# Patient Record
Sex: Male | Born: 1940 | Race: White | Hispanic: No | Smoking: Former smoker
Health system: Southern US, Community
[De-identification: ages and names within clinical notes are randomized; demographics above are authoritative.]

## PROBLEM LIST (undated history)

## (undated) DIAGNOSIS — I447 Left bundle-branch block, unspecified: Secondary | ICD-10-CM

## (undated) DIAGNOSIS — J449 Chronic obstructive pulmonary disease, unspecified: Secondary | ICD-10-CM

## (undated) DIAGNOSIS — I2510 Atherosclerotic heart disease of native coronary artery without angina pectoris: Secondary | ICD-10-CM

## (undated) DIAGNOSIS — I10 Essential (primary) hypertension: Secondary | ICD-10-CM

## (undated) DIAGNOSIS — G43909 Migraine, unspecified, not intractable, without status migrainosus: Secondary | ICD-10-CM

## (undated) DIAGNOSIS — F17200 Nicotine dependence, unspecified, uncomplicated: Secondary | ICD-10-CM

## (undated) DIAGNOSIS — F1011 Alcohol abuse, in remission: Secondary | ICD-10-CM

## (undated) DIAGNOSIS — I951 Orthostatic hypotension: Secondary | ICD-10-CM

## (undated) DIAGNOSIS — I340 Nonrheumatic mitral (valve) insufficiency: Secondary | ICD-10-CM

## (undated) DIAGNOSIS — Z4502 Encounter for adjustment and management of automatic implantable cardiac defibrillator: Secondary | ICD-10-CM

## (undated) DIAGNOSIS — N400 Enlarged prostate without lower urinary tract symptoms: Secondary | ICD-10-CM

## (undated) DIAGNOSIS — E785 Hyperlipidemia, unspecified: Secondary | ICD-10-CM

## (undated) DIAGNOSIS — R55 Syncope and collapse: Secondary | ICD-10-CM

## (undated) DIAGNOSIS — C3490 Malignant neoplasm of unspecified part of unspecified bronchus or lung: Secondary | ICD-10-CM

## (undated) DIAGNOSIS — R911 Solitary pulmonary nodule: Secondary | ICD-10-CM

## (undated) DIAGNOSIS — F1010 Alcohol abuse, uncomplicated: Secondary | ICD-10-CM

## (undated) HISTORY — DX: Hyperlipidemia, unspecified: E78.5

## (undated) HISTORY — DX: Alcohol abuse, in remission: F10.11

## (undated) HISTORY — DX: Essential (primary) hypertension: I10

## (undated) HISTORY — DX: Alcohol abuse, uncomplicated: F10.10

## (undated) HISTORY — DX: Nonrheumatic mitral (valve) insufficiency: I34.0

## (undated) HISTORY — DX: Nicotine dependence, unspecified, uncomplicated: F17.200

## (undated) HISTORY — DX: Chronic obstructive pulmonary disease, unspecified: J44.9

## (undated) HISTORY — DX: Migraine, unspecified, not intractable, without status migrainosus: G43.909

## (undated) HISTORY — DX: Atherosclerotic heart disease of native coronary artery without angina pectoris: I25.10

## (undated) HISTORY — DX: Solitary pulmonary nodule: R91.1

## (undated) HISTORY — DX: Orthostatic hypotension: I95.1

## (undated) HISTORY — DX: Benign prostatic hyperplasia without lower urinary tract symptoms: N40.0

## (undated) HISTORY — DX: Syncope and collapse: R55

## (undated) HISTORY — DX: Left bundle-branch block, unspecified: I44.7

## (undated) HISTORY — DX: Encounter for adjustment and management of automatic implantable cardiac defibrillator: Z45.02

---

## 2006-05-09 ENCOUNTER — Ambulatory Visit: Payer: Self-pay | Admitting: Cardiology

## 2006-05-13 ENCOUNTER — Ambulatory Visit: Payer: Self-pay | Admitting: Cardiology

## 2006-05-13 ENCOUNTER — Inpatient Hospital Stay (HOSPITAL_COMMUNITY): Admission: AD | Admit: 2006-05-13 | Discharge: 2006-05-15 | Payer: Self-pay | Admitting: Cardiology

## 2006-06-05 ENCOUNTER — Ambulatory Visit: Payer: Self-pay | Admitting: Physician Assistant

## 2006-07-13 ENCOUNTER — Ambulatory Visit: Payer: Self-pay | Admitting: Cardiology

## 2006-07-31 ENCOUNTER — Ambulatory Visit: Payer: Self-pay | Admitting: Cardiology

## 2006-08-10 ENCOUNTER — Ambulatory Visit: Payer: Self-pay | Admitting: Internal Medicine

## 2006-08-28 ENCOUNTER — Ambulatory Visit: Payer: Self-pay | Admitting: Cardiology

## 2006-09-03 ENCOUNTER — Ambulatory Visit: Payer: Self-pay | Admitting: Cardiology

## 2006-09-18 ENCOUNTER — Ambulatory Visit: Payer: Self-pay | Admitting: Internal Medicine

## 2006-09-28 ENCOUNTER — Ambulatory Visit: Payer: Self-pay | Admitting: Cardiology

## 2006-11-17 ENCOUNTER — Inpatient Hospital Stay (HOSPITAL_COMMUNITY): Admission: RE | Admit: 2006-11-17 | Discharge: 2006-11-18 | Payer: Self-pay | Admitting: Internal Medicine

## 2006-11-17 ENCOUNTER — Ambulatory Visit: Payer: Self-pay | Admitting: Internal Medicine

## 2006-11-18 ENCOUNTER — Encounter: Payer: Self-pay | Admitting: Cardiology

## 2006-12-03 ENCOUNTER — Ambulatory Visit: Payer: Self-pay

## 2006-12-22 ENCOUNTER — Ambulatory Visit: Payer: Self-pay | Admitting: Cardiology

## 2007-03-05 ENCOUNTER — Ambulatory Visit: Payer: Self-pay | Admitting: Internal Medicine

## 2007-05-27 ENCOUNTER — Ambulatory Visit: Payer: Self-pay | Admitting: Internal Medicine

## 2007-08-13 ENCOUNTER — Ambulatory Visit: Payer: Self-pay | Admitting: Cardiology

## 2007-08-13 ENCOUNTER — Encounter: Payer: Self-pay | Admitting: Physician Assistant

## 2007-08-18 ENCOUNTER — Encounter: Payer: Self-pay | Admitting: Cardiology

## 2007-08-20 ENCOUNTER — Ambulatory Visit: Payer: Self-pay | Admitting: Cardiology

## 2007-08-20 ENCOUNTER — Encounter: Payer: Self-pay | Admitting: Cardiology

## 2007-08-20 ENCOUNTER — Ambulatory Visit: Payer: Self-pay | Admitting: Internal Medicine

## 2007-08-31 ENCOUNTER — Encounter: Payer: Self-pay | Admitting: Cardiology

## 2007-09-10 ENCOUNTER — Encounter: Payer: Self-pay | Admitting: Cardiology

## 2007-12-03 ENCOUNTER — Ambulatory Visit: Payer: Self-pay | Admitting: Internal Medicine

## 2007-12-10 ENCOUNTER — Encounter: Payer: Self-pay | Admitting: Cardiology

## 2008-01-11 ENCOUNTER — Ambulatory Visit: Payer: Self-pay | Admitting: Cardiology

## 2008-03-17 ENCOUNTER — Encounter: Payer: Self-pay | Admitting: Internal Medicine

## 2008-06-29 ENCOUNTER — Ambulatory Visit: Payer: Self-pay | Admitting: Internal Medicine

## 2008-06-29 ENCOUNTER — Encounter: Payer: Self-pay | Admitting: Internal Medicine

## 2008-08-04 ENCOUNTER — Ambulatory Visit: Payer: Self-pay | Admitting: Cardiology

## 2008-10-11 ENCOUNTER — Encounter: Payer: Self-pay | Admitting: Internal Medicine

## 2008-10-11 ENCOUNTER — Telehealth (INDEPENDENT_AMBULATORY_CARE_PROVIDER_SITE_OTHER): Payer: Self-pay | Admitting: *Deleted

## 2008-11-29 ENCOUNTER — Ambulatory Visit: Payer: Self-pay | Admitting: Internal Medicine

## 2009-02-07 ENCOUNTER — Encounter: Payer: Self-pay | Admitting: Cardiology

## 2009-02-08 ENCOUNTER — Ambulatory Visit: Payer: Self-pay | Admitting: Cardiology

## 2009-02-23 ENCOUNTER — Ambulatory Visit: Payer: Self-pay | Admitting: Internal Medicine

## 2009-05-22 ENCOUNTER — Encounter: Payer: Self-pay | Admitting: Cardiology

## 2009-06-01 ENCOUNTER — Ambulatory Visit: Payer: Self-pay | Admitting: Internal Medicine

## 2009-10-12 ENCOUNTER — Ambulatory Visit: Payer: Self-pay | Admitting: Cardiology

## 2009-11-02 ENCOUNTER — Encounter (INDEPENDENT_AMBULATORY_CARE_PROVIDER_SITE_OTHER): Payer: Self-pay | Admitting: *Deleted

## 2010-02-25 ENCOUNTER — Encounter: Payer: Self-pay | Admitting: Internal Medicine

## 2010-02-25 ENCOUNTER — Ambulatory Visit
Admission: RE | Admit: 2010-02-25 | Discharge: 2010-02-25 | Payer: Self-pay | Source: Home / Self Care | Attending: Internal Medicine | Admitting: Internal Medicine

## 2010-03-05 NOTE — Letter (Signed)
Summary: Device-Delinquent Check  [facility], Main Office  [address]   [place], [place] [address]   Phone: [phone]  Fax: [phone]     November 02, 2009 MRN: [id number]   Paul [name] [address] [place], [place]  [address]   Dear Mr. FLEAGLE,  According to our records, you have not had your implanted device checked in the recommended period of time.  We are unable to determine appropriate device function without checking your device on a regular basis.  Please call our office to schedule an appointment with Dr Johney Frame,  as soon as possible.  If you are having your device checked by another physician, please call us so that we may update our records.  Thank you,  Letta Moynahan, EMT  November 02, 2009 10:18 AM  [facility]

## 2010-03-05 NOTE — Procedures (Signed)
Summary: pacer clinic   Current Medications (verified): 1)  Lisinopril 20 Mg Tabs (Lisinopril) .... Take 1/2 Tablet By Mouth Once A Day 2)  Aspirin 325 Mg Tabs (Aspirin) .... Take 1 Tablet By Mouth Once A Day 3)  Omeprazole 20 Mg Cpdr (Omeprazole) .... Take 1 Tablet By Mouth Once A Day 4)  Folic Acid 1 Mg Tabs (Folic Acid) .... Take 1 Tablet By Mouth Once A Day 5)  Diclofenac Sodium 75 Mg Tbec (Diclofenac Sodium) .... Take 1 Tablet By Mouth Once A Day 6)  Coreg 12.5 Mg Tabs (Carvedilol) .... Take 1 Tablet By Mouth Two Times A Day 7)  Crestor 40 Mg Tabs (Rosuvastatin Calcium) .... Take 1 Tablet By Mouth Once A Day 8)  Alprazolam 0.25 Mg Tabs (Alprazolam) .... Take 1 Tablet By Mouth Two Times A Day As Needed 9)  Lasix 40 Mg Tabs (Furosemide) .... Take 1 Tablet By Mouth Once A Day 10)  Zomig 2.5 Mg Tabs (Zolmitriptan) .... Take 1 Tablet By Mouth Once A Day As Needed 11)  Finasteride 5 Mg Tabs (Finasteride) .... Take 1 Tablet By Mouth Once A Day 12)  Proair Hfa 108 (90 Base) Mcg/act Aers (Albuterol Sulfate) .... As Needed 13)  Apap/butubital 325 .... As Needed 14)  Tramadol Hcl 50 Mg Tabs (Tramadol Hcl) .... Take 1-2 Tablets By Mouth Every 8-12 Hours As Needed Pain. 15)  Divalproex Sodium 500 Mg Tbec (Divalproex Sodium) .... Take 3 Tablets By Mouth At Bedtime  Allergies (verified): 1)  Morphine Sulfate (Morphine Sulfate)   ICD Specifications Following MD:  Hillis Range, MD     Referring MD:  KATZ ICD Vendor:  Medtronic     ICD Model Number:  [id number]     ICD Serial Number:  [id number] ICD DOI:  11/17/2006     ICD Implanting MD:  Sherryl Manges, MD  Lead 1:    Location: RA     DOI: 11/17/2006     Model #: 1478     Serial #: [id number]     Status: active Lead 2:    Location: RV     DOI: 11/17/2006     Model #: 5784     Serial #: [id number]     Status: active Lead 3:    Location: LV     DOI: 11/17/2006     Model #: 1324     Serial #: [id number]     Status: active  Indications::  NICM, CHF   ICD  Follow Up Remote Check?  No Battery Voltage:  3.10 V     Charge Time:  10.1 seconds     Underlying rhythm:  SR ICD Dependent:  No       ICD Device Measurements Atrium:  Amplitude: 1.9 mV, Impedance: 496 ohms, Threshold: 1.0 V at 0.4 msec Right Ventricle:  Amplitude: 6.7 mV, Impedance: 528 ohms, Threshold: 1.0 V at 0.4 msec Left Ventricle:  Impedance: 496 ohms, Threshold: 0.5 V at 0.4 msec Configuration: LV TIP TO RV COIL Shock Impedance: 51/63 ohms   Episodes MS Episodes:  0     Shock:  0     ATP:  0     Nonsustained:  0     Atrial Pacing:  0.5%     Ventricular Pacing:  98.3%  Brady Parameters Mode DDD     Lower Rate Limit:  50     Upper Rate Limit 130 PAV 200     Sensed AV Delay:  200  Tachy Zones VF:  200     VT:  FVT VIA VF 200-250     VT1:  176     Next Cardiology Appt Due:  05/04/2009 Tech Comments:  Normal device function.  No changes made today.  Optivol leveling off.  Pt does daily weights.  No symptoms of heart failure.  Pt aware to call if weight increases 2 pounds in 1 day or 5 pounds in 1 week.  ROV 3 months Dr Johney Frame. Gypsy Balsam RN BSN  February 23, 2009 5:29 PM

## 2010-03-05 NOTE — Assessment & Plan Note (Signed)
Summary: 6 mo fu reminder-srs   Visit Type:  Follow-up Primary Provider:  Andee Lineman  [facility] [address]  CC:  cardiomyopathy.  History of Present Illness: The patient is seen for followup his history of cardiomyopathy.  In the past he had a nonischemic cardiopathy.  However echo in July, 2009, revealed ejection fraction improving to 50-55%.  He is doing well.  Is not having any chest pain.  He goes about full activities.  He is followed at the outpatient with the [facility] facility.  There is now a second facility and his doctor is on [employer].  Preventive Screening-Counseling & Management  Alcohol-Tobacco     Smoking Status: current     Packs/Day: 0.33  Comments: Pt is trying to quit smoking. He is currently smoking about 1/3 ppd. He has smoked for about 50 yrs.   Current Medications (verified): 1)  Lisinopril 20 Mg Tabs (Lisinopril) .... Take 1/2 Tablet By Mouth Once A Day 2)  Aspirin 325 Mg Tabs (Aspirin) .... Take 1 Tablet By Mouth Once A Day 3)  Omeprazole 20 Mg Cpdr (Omeprazole) .... Take 1 Tablet By Mouth Once A Day 4)  Folic Acid 1 Mg Tabs (Folic Acid) .... Take 1 Tablet By Mouth Once A Day 5)  Diclofenac Sodium 75 Mg Tbec (Diclofenac Sodium) .... Take 1 Tablet By Mouth Once A Day 6)  Coreg 12.5 Mg Tabs (Carvedilol) .... Take 1 Tablet By Mouth Two Times A Day 7)  Crestor 40 Mg Tabs (Rosuvastatin Calcium) .... Take 1 Tablet By Mouth Once A Day 8)  Alprazolam 0.25 Mg Tabs (Alprazolam) .... Take 1 Tablet By Mouth Two Times A Day As Needed 9)  Lasix 40 Mg Tabs (Furosemide) .... Take 1 Tablet By Mouth Once A Day 10)  Zomig 2.5 Mg Tabs (Zolmitriptan) .... Take 1 Tablet By Mouth Once A Day As Needed 11)  Finasteride 5 Mg Tabs (Finasteride) .... Take 1 Tablet By Mouth Once A Day 12)  Proair Hfa 108 (90 Base) Mcg/act Aers (Albuterol Sulfate) .... As Needed 13)  Apap/butubital 325 .... As Needed 14)  Tramadol Hcl 50 Mg Tabs (Tramadol Hcl) .... Take 1-2 Tablets By Mouth Every  8-12 Hours As Needed Pain. 15)  Divalproex Sodium 500 Mg Tbec (Divalproex Sodium) .... Take 3 Tablets By Mouth At Bedtime  Allergies: 1)  Morphine Sulfate (Morphine Sulfate)  Comments:  Nurse/Medical Assistant: The patient's medications were reviewed with the patient and were updated in the Medication List. Pt verbally confirmed medications.  Past History:  Past Medical History: Last updated: 02/07/2009 cardiomyopathy.Marland Kitchen ejection fraction 25%...  /  Repeat echo July 17,2009   EF 50%. mitral regurgitation...severe..with annular dilatation and poor coaptation of the mitral leaflets. He didnot want surgery......  /  .2-D echo July 2009(after CRT therapy) mitral regurgitation mild to moderate Pulmonary  hypertension... ....echo.Marland KitchenMarland KitchenJuly 2009 COPD smoking....chantix caused headaches lung nodule in the past..[facility] Dys[facility] BPH gout alcohol use  in past.... stable over a long period of time migraines LBBB...very long PR interval CRT-D. device by Dr. Graciela Husbands for CHF CAD  catheter April 2008 single-vessel disease with collateralized branch of the left circumflex Syncope....probably orthostasis  Social History: Packs/Day:  0.33  Review of Systems       Patient denies fever, chills, headache, sweats, rash, change in vision, change in hearing, chest pain, shortness of breath, cough, nausea vomiting, urinary symptoms.  His psychological problems are under good control with medication and counseling.  All other systems  are reviewed and are negative.  Vital Signs:  Patient profile:   70 year old male Height:      67 inches Weight:      156.50 pounds Pulse rate:   72 / minute BP sitting:   121 / 76  (left arm) Cuff size:   regular  Vitals Entered By: Cyril Loosen, RN, BSN (February 08, 2009 1:56 PM) CC: cardiomyopathy Comments No cardiac complaints at present.   Physical Exam  General:  Patient is stable today but he does smell of cigarette  smoke. Eyes:  no xanthelasma. Neck:  no jugulovenous tension. Lungs:  lungs are clear.  Respiratory effort is nonlabored. Heart:  cardiac exam reveals S1-S2.  No clicks or significant murmurs. Abdomen:  abdomen is soft. Extremities:  no peripheral edema. Psych:  patient is oriented to person time and place.  Affect is normal.    ICD Specifications Following MD:  Hillis Range, MD     ICD Vendor:  Medtronic     ICD Model Number:  [phone]     ICD Serial Number:  [id number] ICD DOI:  11/17/2006     ICD Implanting MD:  Sherryl Manges, MD  Lead 1:    Location: RA     DOI: 11/17/2006     Model #: 4132     Serial #: [id number]     Status: active Lead 2:    Location: RV     DOI: 11/17/2006     Model #: 6644     Serial #: [id number]     Status: active Lead 3:    Location: LV     DOI: 11/17/2006     Model #: 6387     Serial #: [id number]     Status: active  Indications::  NICM, CHF   ICD Follow Up ICD Dependent:  No       ICD Device Measurements Configuration: LV TIP TO RV COIL  Brady Parameters Mode DDD     Lower Rate Limit:  50     Upper Rate Limit 130 PAV 200     Sensed AV Delay:  200  Tachy Zones VF:  200     VT:  FVT VIA VF 200-250     VT1:  176     Impression & Recommendations:  Problem # 1:  CAD (ICD-414.00) Coronary disease is stable.  No further workup is needed.  Problem # 2:  SYNCOPE (ICD-780.2) Patient has had no recurrent syncope.  No further workup is needed.  Problem # 3:  HYPERTENSION (ICD-401.9) Hypertension is under good control.  No change in his medicines.  Problem # 4:  TOBACCO ABUSE (ICD-305.1) The patient has continued tobacco abuse.  He says he smokes one pack in 3 days.  I have counseled him to encourage him to stop.  Patient Instructions: 1)  Your physician wants you to follow-up in:24months. You will receive a reminder letter in the mail about two months in advance. If you don't receive a letter, please call our office to schedule the follow-up  appointment. 2)  Your physician recommends that you continue on your current medications as directed. Please refer to the Current Medication list given to you today.

## 2010-03-05 NOTE — Cardiovascular Report (Signed)
Summary: Card Device Clinic/ INTERROGATION QUICK LOOK  Card Device Clinic/ INTERROGATION QUICK LOOK   Imported By: Dorise Hiss 06/04/2009 10:59:33  _____________________________________________________________________  External Attachment:    Type:   Image     Comment:   External Document

## 2010-03-05 NOTE — Assessment & Plan Note (Signed)
Summary: 6 MO FU PER JULY REMINDER-SRS   Visit Type:  Follow-up Referring Provider:  Allred Primary Provider:  Andee Lineman  [facility] [address]   History of Present Illness: The patient is seen for followup of cardiomyopathy.  I saw him last January 2 011. he saw Dr. Johney Frame for BP followup in April, 2011.  He has a CRT-D device.  This is functioning normally.  He recently was seen by his doctor at the [place] and he was stable.  He has not had any chest pain.  There's been no syncope or presyncope.  He has not felt any unusual sensation from his ICD.  Preventive Screening-Counseling & Management  Alcohol-Tobacco     Smoking Status: current     Smoking Cessation Counseling: yes     Packs/Day: <1/2 PPD  Current Medications (verified): 1)  Lisinopril 40 Mg Tabs (Lisinopril) .... Take 1/2 Tablet By Mouth Once A Day 2)  Aspirin 325 Mg Tabs (Aspirin) .... Take 1 Tablet By Mouth Once A Day 3)  Omeprazole 20 Mg Cpdr (Omeprazole) .... Take 1 Tablet By Mouth Once A Day 4)  Folic Acid 1 Mg Tabs (Folic Acid) .... Take 1 Tablet By Mouth Once A Day 5)  Coreg 12.5 Mg Tabs (Carvedilol) .... Take 1 Tablet By Mouth Two Times A Day 6)  Crestor 40 Mg Tabs (Rosuvastatin Calcium) .... Take 1 Tablet By Mouth Once A Day 7)  Alprazolam 0.25 Mg Tabs (Alprazolam) .... Take 1 Tablet By Mouth Two Times A Day As Needed 8)  Lasix 40 Mg Tabs (Furosemide) .... Take 1 Tablet By Mouth Once A Day 9)  Finasteride 5 Mg Tabs (Finasteride) .... Take 1 Tablet By Mouth Once A Day 10)  Proair Hfa 108 (90 Base) Mcg/act Aers (Albuterol Sulfate) .... 2 Puffs Four Times A Day As Needed 11)  Apap/butubital 325 .... As Needed 12)  Tramadol Hcl 50 Mg Tabs (Tramadol Hcl) .... Take 1-2 Tablets By Mouth Every 8-12 Hours As Needed Pain. 13)  Divalproex Sodium 500 Mg Tbec (Divalproex Sodium) .... Take 3 Tablets By Mouth At Bedtime 14)  Spiriva Handihaler 18 Mcg Caps (Tiotropium Bromide Monohydrate) .... Once Each Am 15)  Klor-Con 20 Meq Pack  (Potassium Chloride) .... One By Mouth Daily 16)  Multivitamins  Tabs (Multiple Vitamin) .... Take 1 Tablet By Mouth Once A Day 17)  Nitrostat 0.4 Mg Subl (Nitroglycerin) .... Use As Directed Chest Pain 18)  Artificial Tears  Soln (Artificial Tear Solution) .... One Drop Ou Four Times A Day  Allergies (verified): 1)  Morphine Sulfate (Morphine Sulfate)  Comments:  Nurse/Medical Assistant: The patient's medication bottles and allergies were reviewed with the patient and were updated in the Medication and Allergy Lists.  Past History:  Past Medical History: cardiomyopathy.Marland Kitchen ejection fraction 25%...  /  Repeat echo July 17,2009   EF 50%. mitral regurgitation...severe..with annular dilatation and poor coaptation of the mitral leaflets. He didnot want surgery......  /  .2-D echo July 2009(after CRT therapy) mitral regurgitation mild to moderate Pulmonary  hypertension... ....echo.Marland KitchenMarland KitchenJuly 2009 COPD smoking....chantix caused headaches lung nodule in the past..[facility] BPH gout alcohol use  in past.... stable over a long period of time migraines LBBB...very long PR interval CRT-D. device by Dr. Graciela Husbands for CHF CAD  catheter April 2008 single-vessel disease with collateralized branch of the left circumflex Syncope....probably orthostasis  Social History: Packs/Day:  <1/2 PPD  Review of Systems       Patient denies fever, chills,  headache, sweats, rash, change in vision, change in hearing, chest pain, cough, nausea vomiting, urinary symptoms.  All other systems reviewed are negative  Vital Signs:  Patient profile:   70 year old male Height:      67 inches Weight:      154 pounds BMI:     24.21 Pulse rate:   69 / minute BP sitting:   129 / 85  (left arm) Cuff size:   regular  Vitals Entered By: Carlye Grippe (October 12, 2009 10:58 AM)  Physical Exam  General:  patient is stable today. Eyes:  no xanthelasma. Neck:  no jugular  venous distention. Lungs:  lungs are clear respiratory effort is nonlabored. Heart:  cardiac exam reveals S1-S2.  No clicks or significant murmurs. Abdomen:  abdomen is soft. Extremities:  no peripheral edema. Psych:  patient is oriented to person time and place.  Affect is normal.    ICD Specifications Following MD:  Hillis Range, MD     Referring MD:  KATZ ICD Vendor:  Medtronic     ICD Model Number:  [phone]     ICD Serial Number:  [id number] ICD DOI:  11/17/2006     ICD Implanting MD:  Sherryl Manges, MD  Lead 1:    Location: RA     DOI: 11/17/2006     Model #: 5643     Serial #: [id number]     Status: active Lead 2:    Location: RV     DOI: 11/17/2006     Model #: 6063     Serial #: [id number]     Status: active Lead 3:    Location: LV     DOI: 11/17/2006     Model #: 3557     Serial #: [id number]     Status: active  Indications::  NICM, CHF   ICD Follow Up ICD Dependent:  No       ICD Device Measurements Configuration: LV TIP TO RV COIL  Brady Parameters Mode DDD     Lower Rate Limit:  50     Upper Rate Limit 130 PAV 200     Sensed AV Delay:  200  Tachy Zones VF:  200     VT:  FVT VIA VF 200-250     VT1:  176-200     Impression & Recommendations:  Problem # 1:  CAD (ICD-414.00)  His updated medication list for this problem includes:    Lisinopril 40 Mg Tabs (Lisinopril) .Marland Kitchen... Take 1/2 tablet by mouth once a day    Aspirin 325 Mg Tabs (Aspirin) .Marland Kitchen... Take 1 tablet by mouth once a day    Coreg 12.5 Mg Tabs (Carvedilol) .Marland Kitchen... Take 1 tablet by mouth two times a day    Nitrostat 0.4 Mg Subl (Nitroglycerin) ..... Use as directed chest pain  Orders: EKG w/ Interpretation (93000) Coronary disease is stable.  EKG is done and reviewed by me.  He is paced.  No further workup.  Problem # 2:  HYPERTENSION (ICD-401.9)  His updated medication list for this problem includes:    Lisinopril 40 Mg Tabs (Lisinopril) .Marland Kitchen... Take 1/2 tablet by mouth once a day    Aspirin 325 Mg Tabs  (Aspirin) .Marland Kitchen... Take 1 tablet by mouth once a day    Coreg 12.5 Mg Tabs (Carvedilol) .Marland Kitchen... Take 1 tablet by mouth two times a day    Lasix 40 Mg Tabs (Furosemide) .Marland Kitchen... Take 1 tablet by mouth once a day  Orders: EKG w/ Interpretation (93000) Blood pressure is controlled.  No change in therapy.  Problem # 3:  TOBACCO ABUSE (ICD-305.1) Patient continues to smoke.  I have again advised him to stop.  Problem # 4:  CARDIOMYOPATHY (ICD-425.4)  His updated medication list for this problem includes:    Lisinopril 40 Mg Tabs (Lisinopril) .Marland Kitchen... Take 1/2 tablet by mouth once a day    Aspirin 325 Mg Tabs (Aspirin) .Marland Kitchen... Take 1 tablet by mouth once a day    Coreg 12.5 Mg Tabs (Carvedilol) .Marland Kitchen... Take 1 tablet by mouth two times a day    Lasix 40 Mg Tabs (Furosemide) .Marland Kitchen... Take 1 tablet by mouth once a day    Nitrostat 0.4 Mg Subl (Nitroglycerin) ..... Use as directed chest pain Fortunately his left ventricular function had improved.  He is on appropriate medicines.  No change in therapy.  9 month followup.  Patient Instructions: 1)  Your physician wants you to follow-up in: 9 months. You will receive a reminder letter in the mail one-two months in advance. If you don't receive a letter, please call our office to schedule the follow-up appointment. 2)  Your physician recommends that you continue on your current medications as directed. Please refer to the Current Medication list given to you today.

## 2010-03-05 NOTE — Cardiovascular Report (Signed)
Summary: Office Visit   Office Visit   Imported By: Roderic Ovens 03/13/2009 15:41:58  _____________________________________________________________________  External Attachment:    Type:   Image     Comment:   External Document

## 2010-03-05 NOTE — Assessment & Plan Note (Signed)
Summary: DF2   Visit Type:  Pacemaker check Primary Provider:  Andee Lineman  [facility] [address]   History of Present Illness: The patient presents today for routine electrophysiology followup. He reports doing very well since last being seen in our clinic. He has been started on Spiriva and notes that his breathing is much improved.  He was recently evaluated at the [facility] and found to have worsening renal function.  His diclofenac was discontinued.  The patient denies symptoms of palpitations, chest pain, shortness of breath, orthopnea, PND, lower extremity edema, dizziness, presyncope, syncope, or neurologic sequela. The patient is tolerating medications without difficulties and is otherwise without complaint today.   Current Medications (verified): 1)  Lisinopril 20 Mg Tabs (Lisinopril) .... Take 1/2 Tablet By Mouth Once A Day 2)  Aspirin 325 Mg Tabs (Aspirin) .... Take 1 Tablet By Mouth Once A Day 3)  Omeprazole 20 Mg Cpdr (Omeprazole) .... Take 1 Tablet By Mouth Once A Day 4)  Folic Acid 1 Mg Tabs (Folic Acid) .... Take 1 Tablet By Mouth Once A Day 5)  Coreg 12.5 Mg Tabs (Carvedilol) .... Take 1 Tablet By Mouth Two Times A Day 6)  Crestor 40 Mg Tabs (Rosuvastatin Calcium) .... Take 1 Tablet By Mouth Once A Day 7)  Alprazolam 0.25 Mg Tabs (Alprazolam) .... Take 1 Tablet By Mouth Two Times A Day As Needed 8)  Lasix 40 Mg Tabs (Furosemide) .... Take 1 Tablet By Mouth Once A Day 9)  Zomig 2.5 Mg Tabs (Zolmitriptan) .... Take 1 Tablet By Mouth Once A Day As Needed 10)  Finasteride 5 Mg Tabs (Finasteride) .... Take 1 Tablet By Mouth Once A Day 11)  Proair Hfa 108 (90 Base) Mcg/act Aers (Albuterol Sulfate) .... As Needed 12)  Apap/butubital 325 .... As Needed 13)  Tramadol Hcl 50 Mg Tabs (Tramadol Hcl) .... Take 1-2 Tablets By Mouth Every 8-12 Hours As Needed Pain. 14)  Divalproex Sodium 500 Mg Tbec (Divalproex Sodium) .... Take 3 Tablets By Mouth At Bedtime 15)  Spiriva Handihaler 18 Mcg  Caps (Tiotropium Bromide Monohydrate) .... Once Each Am 16)  Klor-Con 20 Meq Pack (Potassium Chloride) .... One By Mouth Daily  Allergies (verified): 1)  Morphine Sulfate (Morphine Sulfate)  Past History:  Past Medical History: Reviewed history from 02/07/2009 and no changes required. cardiomyopathy.Marland Kitchen ejection fraction 25%...  /  Repeat echo July 17,2009   EF 50%. mitral regurgitation...severe..with annular dilatation and poor coaptation of the mitral leaflets. He didnot want surgery......  /  .2-D echo July 2009(after CRT therapy) mitral regurgitation mild to moderate Pulmonary  hypertension... ....echo.Marland KitchenMarland KitchenJuly 2009 COPD smoking....chantix caused headaches lung nodule in the past..[facility] BPH gout alcohol use  in past.... stable over a long period of time migraines LBBB...very long PR interval CRT-D. device by Dr. Graciela Husbands for CHF CAD  catheter April 2008 single-vessel disease with collateralized branch of the left circumflex Syncope....probably orthostasis  Social History: Reviewed history from 01/11/2008 and no changes required. 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 Head:  normocephalic and atraumatic Eyes:  PERRLA/EOM intact; conjunctiva and lids normal. Mouth:  Teeth, gums and palate normal. Oral mucosa normal. Neck:  no jugulovenous tension. Chest Wall:  ICD pocket is well healed Lungs:  lungs are clear.  Respiratory effort is nonlabored. Heart:  Non-displaced PMI, chest non-tender; regular rate and rhythm,  S1, S2 without murmurs, rubs or gallops. Carotid upstroke normal, no bruit. Normal abdominal aortic size, no bruits. Femorals normal pulses, no bruits. Pedals normal pulses. No edema, no varicosities. Abdomen:  abdomen is soft. Msk:  Back normal, normal gait. Muscle strength and tone normal. Pulses:  pulses normal in all 4 extremities Extremities:  No clubbing or cyanosis. Neurologic:  Alert and oriented x 3.    ICD Specifications Following MD:  Hillis Range, MD     Referring MD:  KATZ ICD Vendor:  Medtronic     ICD Model Number:  [id number]     ICD Serial Number:  [id number] ICD DOI:  11/17/2006     ICD Implanting MD:  Sherryl Manges, MD  Lead 1:    Location: RA     DOI: 11/17/2006     Model #: [id number]     Serial #: [id number]     Status: active Lead 2:    Location: RV     DOI: 11/17/2006     Model #: 5784     Serial #: [id number]     Status: active Lead 3:    Location: LV     DOI: 11/17/2006     Model #: 1324     Serial #: [id number]     Status: active  Indications::  NICM, CHF   ICD Follow Up Remote Check?  No Battery Voltage:  3.06 V     Charge Time:  10.6 seconds     Underlying rhythm:  sinus brady ICD Dependent:  No       ICD Device Measurements Atrium:  Amplitude: 2.7 mV, Impedance: 536 ohms, Threshold: 1.0 V at 0.4 msec Right Ventricle:  Amplitude: 8.4 mV, Impedance: 560 ohms, Threshold: 1.0 V at 0.4 msec Left Ventricle:  Impedance: 488 ohms, Threshold: 0.5 V at 0.4 msec Configuration: LV TIP TO RV COIL  Episodes MS Episodes:  0     Percent Mode Switch:  0     Shock:  0     ATP:  0     Nonsustained:  0     Atrial Therapies:  0 Atrial Pacing:  0.3%     Ventricular Pacing:  99.9%  Brady Parameters Mode DDD     Lower Rate Limit:  50     Upper Rate Limit 130 PAV 200     Sensed AV Delay:  200  Tachy Zones VF:  200     VT:  FVT VIA VF 200-250     VT1:  176-200     Next Cardiology Appt Due:  09/03/2009 Tech Comments:  OPTIVOL ELEVATED ON 05-21-09.  NORMAL DEVICE FUNCTION.  NO CHANGES  MADE.  ROV IN 3 MTHS IN CLINIC. Altha Harm, LPN  June 01, 2009 9:49 AM MD Comments:  agree.  Optivol recently elevated, but presently has improved.  Impression & Recommendations:  Problem # 1:  ACUTE ON CHRONIC SYSTOLIC HEART FAILURE (ICD-428.23) The patient has chronic systolic dysfunction.  He has a BiV ICD which is functioning normally.  No changes today.  Problem #  2:  TOBACCO ABUSE (ICD-305.1) cessation advised  Patient Instructions: 1)  return in 3 months

## 2010-03-05 NOTE — Miscellaneous (Signed)
  Clinical Lists Changes  Problems: Added new problem of TOBACCO ABUSE (ICD-305.1) Added new problem of * LUNG NODULE Added new problem of HYPERTENSION (ICD-401.9) Added new problem of DYSLIPIDEMIA (ICD-272.4) Added new problem of * MIGRAINES Added new problem of LBBB (ICD-426.3) Added new problem of * ICD / CRT Added new problem of SYNCOPE (ICD-780.2) Added new problem of CAD (ICD-414.00) Observations: Added new observation of PAST MED HX: cardiomyopathy.Marland Kitchen ejection fraction 25%...  /  Repeat echo July 17,2009   EF 50%. mitral regurgitation...severe..with annular dilatation and poor coaptation of the mitral leaflets. He didnot want surgery......  /  .2-D echo July 2009(after CRT therapy) mitral regurgitation mild to moderate Pulmonary  hypertension... ....echo.Marland KitchenMarland KitchenJuly 2009 COPD smoking....chantix caused headaches lung nodule in the past..[facility] BPH gout alcohol use  in past.... stable over a long period of time migraines LBBB...very long PR interval CRT-D. device by Dr. Graciela Husbands for CHF CAD  catheter April 2008 single-vessel disease with collateralized branch of the left circumflex Syncope....probably orthostasis (02/07/2009 19:51) Added new observation of PRIMARY MD: [facility] (02/07/2009 19:51)       Past History:  Past Medical History: cardiomyopathy.Marland Kitchen ejection fraction 25%...  /  Repeat echo July 17,2009   EF 50%. mitral regurgitation...severe..with annular dilatation and poor coaptation of the mitral leaflets. He didnot want surgery......  /  .2-D echo July 2009(after CRT therapy) mitral regurgitation mild to moderate Pulmonary  hypertension... ....echo.Marland KitchenMarland KitchenJuly 2009 COPD smoking....chantix caused headaches lung nodule in the past..[facility] BPH gout alcohol use  in past.... stable over a long period of time migraines LBBB...very long PR interval CRT-D.  device by Dr. Graciela Husbands for CHF CAD  catheter April 2008 single-vessel disease with collateralized branch of the left circumflex Syncope....probably orthostasis

## 2010-03-07 NOTE — Assessment & Plan Note (Signed)
Summary: DF2   Visit Type:  Pacemaker check Referring Provider:  Johntae Broxterman Primary Provider:  Andee Lineman  [facility]   History of Present Illness: The patient presents today for routine electrophysiology followup. He reports doing very well since last being seen in our clinic. Unfortuantely, he continues to smoke 1/2 PPD.  His daughter has enrolled in college at [employer] and is doing very well.  The patient is obviously quite proud of her.  The patient denies symptoms of palpitations, chest pain, shortness of breath, orthopnea, PND, lower extremity edema, dizziness, presyncope, syncope, or neurologic sequela. The patient is tolerating medications without difficulties and is otherwise without complaint today.   Preventive Screening-Counseling & Management  Alcohol-Tobacco     Smoking Status: current     Packs/Day: 0.5  Current Medications (verified): 1)  Lisinopril 40 Mg Tabs (Lisinopril) .... Take 1/2 Tablet By Mouth Once A Day 2)  Aspirin 325 Mg Tabs (Aspirin) .... Take 1 Tablet By Mouth Once A Day 3)  Omeprazole 20 Mg Cpdr (Omeprazole) .... Take 1 Tablet By Mouth Once A Day 4)  Folic Acid 1 Mg Tabs (Folic Acid) .... Take 1 Tablet By Mouth Once A Day 5)  Coreg 12.5 Mg Tabs (Carvedilol) .... Take 1 Tablet By Mouth Two Times A Day 6)  Crestor 40 Mg Tabs (Rosuvastatin Calcium) .... Take 1 Tablet By Mouth Once A Day 7)  Alprazolam 0.25 Mg Tabs (Alprazolam) .... Take 1 Tablet By Mouth Two Times A Day As Needed 8)  Lasix 40 Mg Tabs (Furosemide) .... Take 1 Tablet By Mouth Once A Day 9)  Finasteride 5 Mg Tabs (Finasteride) .... Take 1 Tablet By Mouth Once A Day 10)  Apap/butubital 325 .... As Needed 11)  Tramadol Hcl 50 Mg Tabs (Tramadol Hcl) .... Take 1-2 Tablets By Mouth Every 8-12 Hours As Needed Pain. 12)  Divalproex Sodium 500 Mg Tbec (Divalproex Sodium) .... Take 3 Tablets By Mouth At Bedtime 13)  Spiriva Handihaler 18 Mcg Caps (Tiotropium Bromide Monohydrate) .... Once Each  Am 14)  Klor-Con 20 Meq Pack (Potassium Chloride) .... One By Mouth Daily 15)  Multivitamins  Tabs (Multiple Vitamin) .... Take 1 Tablet By Mouth Once A Day 16)  Nitrostat 0.4 Mg Subl (Nitroglycerin) .... Use As Directed Chest Pain 17)  Artificial Tears  Soln (Artificial Tear Solution) .... One Drop Ou Four Times A Day  Allergies (verified): 1)  Morphine Sulfate (Morphine Sulfate)  Comments:  Nurse/Medical Assistant: The patient's medications and allergies were reviewed with the patient and were updated in the Medication and Allergy Lists. Tammi Romine CMA (February 25, 2010 3:45 PM)  Past History:  Past Medical History: Reviewed history from 10/12/2009 and no changes required. cardiomyopathy.Marland Kitchen ejection fraction 25%...  /  Repeat echo July 17,2009   EF 50%. mitral regurgitation...severe..with annular dilatation and poor coaptation of the mitral leaflets. He didnot want surgery......  /  .2-D echo July 2009(after CRT therapy) mitral regurgitation mild to moderate Pulmonary  hypertension... ....echo.Marland KitchenMarland KitchenJuly 2009 COPD smoking....chantix caused headaches lung nodule in the past..[facility] BPH gout alcohol use  in past.... stable over a long period of time migraines LBBB...very long PR interval CRT-D. device by Dr. Graciela Husbands for CHF CAD  catheter April 2008 single-vessel disease with collateralized branch of the left circumflex Syncope....probably orthostasis  Social History: Reviewed history from 01/11/2008 and no changes required. 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 Head:  normocephalic and atraumatic Eyes:  PERRLA/EOM intact; conjunctiva and lids normal. Mouth:  Teeth, gums and palate normal. Oral mucosa normal. Neck:  supple Lungs:  Clear bilaterally to auscultation and percussion. Heart:  Non-displaced PMI, chest non-tender; regular rate and rhythm, S1, S2 without murmurs, rubs or gallops. Carotid upstroke normal, no bruit. Normal abdominal aortic size, no bruits. Femorals normal pulses, no bruits. Pedals normal pulses. No edema, no varicosities. Abdomen:  Bowel sounds positive; abdomen soft and non-tender without masses, organomegaly, or hernias noted. No hepatosplenomegaly. Msk:  Back normal, normal gait. Muscle strength and tone normal. Extremities:  No clubbing or cyanosis. Neurologic:  Alert and oriented x 3. Skin:  Intact without lesions or rashes.    ICD Specifications Following MD:  Hillis Range, MD     Referring MD:  KATZ ICD Vendor:  Medtronic     ICD Model Number:  [id number]     ICD Serial Number:  [id number] H ICD DOI:  11/17/2006     ICD Implanting MD:  Sherryl Manges, MD  Lead 1:    Location: RA     DOI: 11/17/2006     Model #: 1478     Serial #: [id number]     Status: active Lead 2:    Location: RV     DOI: 11/17/2006     Model #: 5784     Serial #: [id number]     Status: active Lead 3:    Location: LV     DOI: 11/17/2006     Model #: 4542     Serial #: [id number]     Status: active  Indications::  NICM, CHF   ICD Follow Up Battery Voltage:  3.03 V     Charge Time:  10.9 seconds     Underlying rhythm:  SB ICD Dependent:  No       ICD Device Measurements Atrium:  Amplitude: 2.0 mV, Impedance: 472 ohms, Threshold: 1.0 V at 0.4 msec Right Ventricle:  Amplitude: 8.9 mV, Impedance: 512 ohms, Threshold: 1.5 V at 0.4 msec Left Ventricle:  Impedance: 528 ohms, Threshold: 1.0 V at 0.4 msec Configuration: LV TIP TO RV COIL Shock Impedance: 58/76 ohms    Episodes MS Episodes:  0     Shock:  0     ATP:  0     Nonsustained:  0     Atrial Therapies:  0  Brady Parameters Mode DDD     Lower Rate Limit:  50     Upper Rate Limit 130 PAV 200     Sensed AV Delay:  200  Tachy Zones VF:  200     VT:  FVT VIA VF 200-250     VT1:  176-200     Next Cardiology Appt Due:  05/06/2010 Tech Comments:  1 NST EPISODE LASTING 7 BEATS.  NORMAL DEVICE FUNCTION.  CHANGED RV OUTPUT FROM 2.5 TO 3.0 AND LV OUTPUT FROM 2.5 TO 2.0 V.  TURNED ON  1:1 SVT AND CHANGED MAX LEAD IMPEDANCES FOR LIA.  OPTIVOL UP AND DOWN.  DEMONSTRATED TONE FOR PT AND PT UNABLE TO HEAR SOUND. ROV IN 3 MTHS W/DEVICE CLINIC. Vella Kohler  February 25, 2010 3:59 PM MD Comments:  agree  Impression & Recommendations:  Problem # 1:  CHRONIC SYSTOLIC HEART FAILURE (ICD-428.22) stable no significant volume overload today normal ICD function as above  Problem # 2:  HYPERTENSION (ICD-401.9) stable  Problem # 3:  TOBACCO ABUSE (ICD-305.1) cessation advised he is not ready to quit  Problem # 4:  CAD (ICD-414.00) no symptoms of ischemia no changes

## 2010-03-13 NOTE — Cardiovascular Report (Signed)
Summary: Card Device Clinic/ INTERROGATION QUICK LOOK  Card Device Clinic/ INTERROGATION QUICK LOOK   Imported By: Dorise Hiss 03/07/2010 17:01:53  _____________________________________________________________________  External Attachment:    Type:   Image     Comment:   External Document

## 2010-05-15 ENCOUNTER — Encounter: Payer: Self-pay | Admitting: *Deleted

## 2010-05-15 ENCOUNTER — Ambulatory Visit (INDEPENDENT_AMBULATORY_CARE_PROVIDER_SITE_OTHER): Payer: Medicare Other | Admitting: *Deleted

## 2010-05-15 DIAGNOSIS — I509 Heart failure, unspecified: Secondary | ICD-10-CM

## 2010-05-15 DIAGNOSIS — I428 Other cardiomyopathies: Secondary | ICD-10-CM

## 2010-05-15 DIAGNOSIS — I5023 Acute on chronic systolic (congestive) heart failure: Secondary | ICD-10-CM

## 2010-05-15 NOTE — Progress Notes (Signed)
icd check in clinic  

## 2010-06-18 NOTE — Discharge Summary (Signed)
Paul [name], Paul [name]                ACCOUNT NO.:  [id number]   MEDICAL RECORD NO.:  [id number]          PATIENT TYPE:  INP   LOCATION:  4707                         FACILITY:  [facility]   PHYSICIAN:  Duke Salvia, MD, FACCDATE OF BIRTH:  1940-03-29   DATE OF ADMISSION:  11/17/2006  DATE OF DISCHARGE:  11/18/2006                               DISCHARGE SUMMARY   PROCEDURE:  Insertion of Medtronic biventricular ICD.   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   SECONDARY DIAGNOSES:  1. Chronic systolic congestive heart failure class II B.  2. Severe mitral regurgitation.  3. Chronic obstructive pulmonary disease.  4. Hypertension.  5. Left bundle branch block, first-degree atrioventricular block      (pulmonary regurgitation greater than 4 milliseconds.  6. Status post cardiac catheterization, May 14, 2006 showing left      main luminal irregularities, left anterior descending luminal      irregularities, diagonal and its branches no significant disease,      circumflex 40% ostial, first obtuse marginal 50% ostial (occluded      distally), atrioventricular circumflex 80%, dominant right coronary      artery, minor luminal irregularities, ejection fraction 35%  7. Acute pulmonary edema in April 2008 requiring intubation and      ventilation.  8. Tobacco use.  9. Remote history of ethanol.  10.Gout  11.Benign prostatic hypertrophy.   TIME OF DISCHARGE:  40 minutes.   HOSPITAL COURSE:  Paul [name] is a 70 year old male with a history of  nonischemic cardiomyopathy.  He was evaluated by Dr. Graciela Husbands on September 28, 2006 and he was felt to be candidate for ICD/CRT. He was initially  scheduled for November 06, 2006, but rescheduled for November 17, 2006 and  came to the hospital on that date for the procedure.   Paul [name] had a biventricular dual-lead ICD (Medtronic) implanted on  November 17, 2006.  His Coreg was up titrated for better heart  rate and  blood pressure control.  A post-procedure chest x-ray showed left AICD  placed with leads in the right atrium, right ventricle and coronary  sinus, no pneumothorax.  On November 18, 2006, he was evaluated by Dr.  Graciela Husbands and considered stable for discharge with outpatient followup  arranged.   DISCHARGE INSTRUCTIONS:  1. His activity level is to be increased gradually per the discharge      sheet.  2. He can shower and 1 week and he can drive in 10 days.  3. He is to follow up with a wound check on December 03, 2006 in      [place].  4. He is to see Dr. Myrtis Ser on December 22, 2006 at 8:45.  5. He is to follow up with Dr. Gerlene Fee at the [facility] in [place] as      scheduled.  6. He is to see Dr. Graciela Husbands in 3 months and he will attempt to schedule      that appointment in [facility] when he comes in for his wound check.   DISCHARGE MEDICATIONS:  1. Aspirin 325  mg daily.  2. Coreg 6.25 mg 1-1/2 tablets b.i.d.  3. Lisinopril 10 mg daily.  4. Lasix 40 mg daily.  5. K-Dur 20 mEq daily.  6. Zomig 2.5 mg daily.  7. Omeprazole 20 mg a day.  8. Simvastatin 80 mg daily  9. Proscar 5 mg daily.  10.Folic acid daily.  11.Xanax 0.25 mg p.r.n.  12.Darvocet p.r.n.  13.Fioricet p.r.n. for headaches.      Theodore Demark, PA-C      Duke Salvia, MD, [facility]  Electronically Signed    RB/MEDQ  D:  11/18/2006  T:  11/19/2006  Job:  [id number]   cc:   Gerlene Fee, MD  [facility]

## 2010-06-18 NOTE — Assessment & Plan Note (Signed)
[facility]                          [facility] NOTE   Paul [name], Paul [name]                       MRN:          [id number]  DATE:01/11/2008                            DOB:          10/06/1940    [name] has full of energy and doing well today.  He has significant  coronary artery disease and LV dysfunction.  He has an implantable unit  with chronic resynchronization therapy.  He is not having any chest  pain.  He has not had any major syncope or presyncope.  His unit was  assessed on December 03, 2007 in the [place] office by Dr. Graciela Husbands.  I saw him  last on August 13, 2007.  He is doing well.  He is not having any  significant chest pain.   PAST MEDICAL HISTORY:   ALLERGIES:  MORPHINE.   MEDICATIONS:  See the flow sheet.   REVIEW OF SYSTEMS:  He is not having any GI or GU symptoms.  He is not  having any headaches, skin rashes, chills, or fevers.  His review of  systems is negative.   PHYSICAL EXAMINATION:  VITAL SIGNS:  Blood pressure is 150/90 with a  pulse of 65.  GENERAL:  The patient is oriented to person, time, and place.  Affect is  normal.  HEENT:  No xanthelasma.  He has normal extraocular motion.  He has poor  dentition.  He actually states that he pulls his own teeth when needed.  NECK:  There are no carotid bruits.  There is no jugular venous  distention.  LUNGS:  Clear.  Respiratory effort is not labored.  CARDIAC:  An S1 with an S2.  There are no clicks or significant murmurs.  ABDOMEN:  Soft.  EXTREMITIES:  He has no peripheral edema.   Problems are listed completely on my note of August 13, 2007.  Unfortunately, he continues to smoke, but he says he is down to less  than half a pack a day.  I have not changed his medicines.  His coronary  artery disease is stable.  His overall cardiac status is stable.  I am  not changing his meds.     Luis Abed, MD, [facility]  Electronically Signed    JDK/MedQ  DD: 01/11/2008   DT: 01/12/2008  Job #: [id number]   cc:   Normajean Glasgow. Piva, DO @ [facility] [place]

## 2010-06-18 NOTE — Cardiovascular Report (Signed)
[facility]                   [facility]   KEMET, NIJJAR                       MRN:          [id number]  DATE:03/05/2007                            DOB:          1940-07-12    Mr. Grabill comes in following CRTD implantation in October 2008 for class  III congestive heart failure in the setting of ischemic and nonischemic  heart disease.  He is functionally vastly improved, now a class II.  He  has, unfortunately, reverted into cigarette smoking; he, a little bit  like Madelaine Bhat, blames his wife.   His medications currently include:  1. Coreg at 9.375 b.i.d.  2. Lisinopril 10.  3. Finasteride.  4. Omeprazole.  5. Diclofenac.  6. Lasix 40.  7. Aspirin.   On examination, his blood pressure today was 150/90, his pulse was 60.  His lungs were clear, heart sounds were regular.  The extremities were  without edema, the skin was warm and dry.   Interrogation of his Medtronic Concerto device demonstrates a P-wave of  2.5 with impedance of 576, a threshold of 1 volt at 0.4 in the RA and  the RV, with the RV impedance of 496 and amplitude 14.5, in the LV  impedance of 40 with a threshold 0.5at 0.4.  He is 93% ventricular  paced, 2.2% atrial paced.  There have been no intercurrent therapies.   IMPRESSION:  1. Congestive heart failure - chronic - systolic - improved since      cardiac resynchronization therapy implantation.  2. Ischemic and nonischemic cardiomyopathy.  3. Status post Medtronic CRT as noted.  4. Polyuria.  5. Hypertension.   We will plan to increase his Coreg today from 9.375 to 12.5 b.i.d.  We  will check a BMET given his significant improvement with a need to  assess the potential for prerenal azotemia from over-diuresis.   We will see him again in 3 months' time.  He will be seeing Dr. Myrtis Ser in  about 5 months.     Duke Salvia, MD, [facility]  Electronically Signed   SCK/MedQ  DD: 03/05/2007  DT:  03/06/2007  Job #: [id number]

## 2010-06-18 NOTE — Letter (Signed)
September 18, 2006    Luis Abed, MD, [facility]  [address]  [address]  [place], [place] [address]   RE:  Paul [name], Paul [name]  MRN:  [id number]  /  DOB:  1940-09-29   Dear Trey Paula,   Paul [name] was seen.  I had seen him in the hospital.  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  There has been consideration given  to percutaneous coronary sinus clamping.   He clearly has indications for ICD implantation and, with his first  degree AV block and left bundle branch block, notwithstanding his Class  2 symptoms, I would be in favor of placing an LV lead at the time of  device implantation for AV and RV, LV synchrony.  The question that I  have is whether that should be done before or after a percutaneous  mitral valvular coronary sinus clamp is inserted and we will need to get  input from Dr. Modesto Charon or Dr. Andee Lineman about that, if the latter has spoken  with the former.   His medications currently include:   1. Aspirin.  2. Coreg 6.25 b.i.d.  3. Lisinopril 5 b.i.d.  4. Finasteride.  5. Omeprazole.  6. Simvastatin 60 h.s.  7. Lasix.   EXAMINATION TODAY:  His blood pressure was 119/87 with a pulse of 77.  His neck veins were 6-7 cm.  His lungs were clear.  Heart sounds were regular with a 2/6 holosystolic murmur.  The extremities had no edema.   IMPRESSION:  1. 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  2. Congestive heart failure, chronic, systolic, class 2-B.  3. Severe mitral regurgitation.  4. COPD.  5. Hypertension.  6. 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   DISCUSSION:  Trey Paula, Paul [name] would appropriately benefit from ICD  implantation.  For the reasons I alluded to above, I would proceed with  CRT implantation at the same time.  I have reviewed the potential  benefits, as well as potential risks with the patient and he is  agreeable to proceeding.  These would include, but are not limited to,  death, perforation, infection, bleeding, dislodgement and device  malfunction.   The other issue is, if he is going to be a candidate for percutaneous  mitral valve repair, whether that should be done before or after an LV  lead is placed.   I am not sure there is a great deal of experience with this either way,  but we should get that before we proceed.  He is tentatively scheduled  for mid-September, as he wanted to see you first and get moved into his  new house.   Thank you very much for allowing [place] to participate in his care.    Sincerely,      Duke Salvia, MD, [facility]  Electronically Signed    SCK/MedQ  DD: 09/18/2006  DT: 09/18/2006  Job #: [id number]

## 2010-06-18 NOTE — Assessment & Plan Note (Signed)
[facility]                          [facility]   Paul [name], Paul [name]                       MRN:          [id number]  DATE:09/28/2006                            DOB:          1941/01/29    Mr Alcala is here for cardiology followup. I have been helped in the  evaluation by both Dr. Andee Lineman and Dr. Graciela Husbands. I saw the patient last on  July 31, 2006 and I arranged for a TTE. The TTE was done by Dr. Andee Lineman  and Dr. Andee Lineman saw the patient back on September 03, 2006. He felt that he  had very severe mitral regurgitation with 2 separate jets and flow  reversal in the pulmonary veins. It appears that the patient will not be  a good candidate for surgical repair of his mitral valve because he does  not want the surgery and because he has severe LV dysfunction. The  question was raised as to whether or not he is a candidate for a mitral  valve clip in the [facility] Trial. Dr. Andee Lineman was to be in touch with  Dr.  Regino Schultze at [facility]. I will speak with Dr. Andee Lineman tomorrow. It is my  understanding that patients must be able to be randomized with a 2 to 1  randomization of mitral valve clip versus mitral valve surgery. Since  the patient is not currently a surgical candidate, I do not think he  will qualify for being in this study for a potential clip. In addition  the patient is constantly talking about taking care of his family and  how he can not possibly have an operation and be tied up from taking  care of his family.   In the meantime he has also seen Dr. Graciela Husbands and there is an indication  for ICD/CRT.  I suspect the next step now will be to proceed with the  procedure by Dr. Graciela Husbands but I will finalize this with Dr. Andee Lineman soon and  then call the patient.   PAST MEDICAL HISTORY:   ALLERGIES:  MORPHINE.   MEDICATIONS:  Aspirin, Coreg, Lasix, K-Dur, diclofenac, folic acid,  omeprazole, simvastatin, finasteride, and lisinopril.   OTHER MEDICAL PROBLEMS:  See  the list on the of problems #1 through #15  on the note of September 03, 2006.   REVIEW OF SYSTEMS:  Remarkably he is feeling well. His review of systems  is negative.   PHYSICAL EXAMINATION:  Blood pressure is 138/80, with a pulse of 80. The  patient is oriented to person, time, and place. Affect is normal. He is  quite talkative today.  HEENT: Reveals no xanthelasma. He has normal extraocular motion. There  are no carotid bruits. There is no jugular venous distension. The  patient definitely smells of smoke. He says that he is not smoking. It  is possible that he may burn cigars for the smell giving him this odor.  I am really not sure but he insists that he is not smoking. He has poor  dentition.  LUNGS: Reveal distant lung sounds. There is no respiratory distress.  CARDIAC EXAM:  Reveals an S1 with an S2. His heart sounds are fairly  distant. There is a soft systolic murmur at the apex.  ABDOMEN: Soft. He has normal bowel sounds. He has no peripheral edema.   Problems are listed on the note of September 03, 2006. The patient has left  ventricular dysfunction and severe mitral regurgitation. He is a  candidate for ICD/CRT. I will finalize the issue as to whether or not he  is a candidate for a mitral valve clip. It is my understanding that Dr.  Andee Lineman was referring to the clip to the mitral valve leaflets and not a  percutaneous approach to his coronary sinus. I will discuss this further  with Dr. Andee Lineman. If it is felt that he is not a candidate for mitral  procedure at this time, I will finalize the plans with Dr. Graciela Husbands to  proceed with placement of his CRT/ICD.     Luis Abed, MD, [facility]  Electronically Signed    JDK/MedQ  DD: 09/28/2006  DT: 09/29/2006  Job #: [id number]   cc:   Learta Codding, MD,FACC  Duke Salvia, MD, [facility]

## 2010-06-18 NOTE — Assessment & Plan Note (Signed)
[facility]                          [facility]   DIAN, MINAHAN                       MRN:          [id number]  DATE:08/13/2007                            DOB:          [birth year]    CARDIOLOGIST:  Luis Abed, MD, [facility]   ELECTROPHYSIOLOGIST:  Duke Salvia, MD, [facility]   PRIMARY CARE PHYSICIAN:  Normajean Glasgow. Gerlene Fee, MD   REASON FOR VISIT:  A 57-month followup.   HISTORY OF PRESENT ILLNESS:  Mr. 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 Graciela Husbands in November 2008.  He returns today for  routine followup.  He was last seen by Dr. Myrtis Ser in November 2008.  He  was last seen by Dr. Graciela Husbands in April 2009.  At that time, Dr. Graciela Husbands  adjusted his Coreg therapy up to 25 mg twice a day.  The patient tells  me he also had his device adjusted somewhat.   The patient notes that shortly after his device was adjusted and he  increased his Coreg, he started to experience orthostasis.  He actually  had 3 episodes, where he passed out shortly after standing.  The patient  was feeling fatigued with this as well and he eventually decreased his  Coreg back to 12.5 mg twice a day.  Since he has done that, he feels  much better.  He describes NYHA class II symptoms.  He denies orthopnea,  PND, or pedal edema.  He denies any chest pain.  He denies any ICD  therapy.   CURRENT MEDICATIONS:  1. Aspirin 325 mg daily.  2. Lisinopril 10 mg daily.  3. Lasix 40 mg daily.  4. K-Dur 20 mEq daily.  5. Zomig 2.5 mg daily.  6. Omeprazole 20 mg daily.  7. Finasteride 5 mg daily.  8. Folic acid daily.  9. Darvocet-N 100 b.i.d.  10.Butalbital.  11.Diclofenac.  12.Coreg 25 mg b.i.d.  13.Rosuvastatin 40 mg nightly.  14.Albuterol 4 times a day.  15.Xanax 0.25 mg b.i.d.  16.Xanax p.r.n.  17.Darvocet p.r.n.  18.Butalbital p.r.n.  19.Albuterol p.r.n.   PHYSICAL EXAMINATION:  GENERAL:  He is a well-nourished and well-  developed male in no distress.  VITAL SIGNS:  Blood pressure is 139/94, pulse 67, weight 157.4 pounds,  and previous weight was 157.6 pounds.  HEENT:  Normal.  NECK:  Without JVD.  CARDIAC:  Normal S1 and S2.  Regular rate and rhythm.  LUNGS:  Clear to auscultation bilaterally.  ABDOMEN:  Soft and nontender with normoactive bowel sounds.  No  organomegaly.  EXTREMITIES:  Without edema.  NEUROLOGIC:  He is alert and oriented x3.  Cranial nerves II-XII are  grossly intact.   Electrocardiogram reveals atrial sensed and ventricular paced rhythm  with a heart rate of 67.   IMPRESSION:  1. 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  2. Status post cardiac resynchronization therapy/automatic implantable  cardioverter-defibrillator implantation.  3. Chronic systolic congestive heart failure.      a.     New York Heart Association class II symptoms.  4. Single-vessel coronary artery disease - medical therapy.  5. Recent history of syncope.      a.     suspect secondary to orthostasis.  6. Severe mitral regurgitation likely secondary to papillary muscle      dysfunction/ischemic mitral regurgitation.  7. Hypertension.  8. Hyperlipidemia.  9. Migraine headaches.  10.History of lung nodule followed by his primary care physician at      the [facility].  11.Chronic obstructive pulmonary disease with ongoing tobacco abuse.   PLAN:  1. Mr. Ishmael presents for followup.  He did have syncope recently when      his Coreg was adjusted.  He describes orthostatic intolerance.  The      patient's symptoms have improved back to baseline since he cut his      Coreg back down to 12.5 mg b.i.d.  I have asked him to remain at      this dose.  He does have a followup next week with Dr. Graciela Husbands.      Further adjustments and recommendations will be per Dr.  Graciela Husbands, when      he sees him next week.  2. The patient's medical therapy is adequate.  He could tolerate a      higher dose of ACE inhibitor and I have asked him to increase his      lisinopril to 20 mg a day.  Of course, if he develops more      orthostatic intolerance with this, he knows to cut back to just 10      mg a day.  3. The patient will have a followup BMET in 1 week's time to assess      his renal function and potassium given uptitration of his ACE      inhibitor.  4. The patient is in need of followup echocardiogram to reassess his      LV function as well as his mitral regurgitation.  This will be      achieved over the next couple of weeks.  5. We will try to obtain his recent lipid panel done at the [facility]      [employer].  6. The patient will follow up with Dr. Myrtis Ser in the next 3 months or      sooner p.r.n.      Tereso Newcomer, PA-C  Electronically Signed      Luis Abed, MD, [facility]  Electronically Signed   SW/MedQ  DD: 08/13/2007  DT: 08/14/2007  Job #: [id number]   cc:   Normajean Glasgow. Piva, DO

## 2010-06-18 NOTE — Cardiovascular Report (Signed)
[facility]                   [facility]   Paul, [name]                       MRN:          [id number]  DATE:12/03/2007                            DOB:          [birth year]    [name] is seen in followup for congestive heart failure in the  setting of CRTD implantation.  From exercise capacity point of view he  is doing what he wants.  He is hauling trees and firewood, shopping, and  maintaining his ADLs.   His major complaints are a 10-pound weight loss in the last 3 months.  He relates that to the episode of bronchitis a month ago that was  associated with anorexia.  The anorexia has resolved.  He has had no  concomitant night sweats.   He has also developed polyuria and I note on his last blood work from [facility]  in [place] that his sugar was 140 or so.   He also describes waking with his right hand swollen.  He notes also  that his mouth has been little bit drier, his eyes had been drier.   MEDICATIONS:  1. Aspirin 324.  2. Lisinopril 20.  3. Lasix 20.  4. Potassium.  5. Omeprazole 20.  6. Finasteride 5.  7. Coreg 12.5 b.i.d.  8. Xanax 0.25 b.i.d.   PHYSICAL EXAMINATION:  VITAL SIGNS:  His blood pressure is 139/85, his  pulse was 75, his weight was 146.  NECK:  Veins were 7 cm.  LUNGS:  Clear.  HEART:  Sounds were regular.  EXTREMITIES:  Without edema.  There was weakness with his right thumb  opposition and a little bit of thenar wasting.   Interrogation of his Medtronic Concerta device demonstrates P-wave of 2  with impedance of 520, threshold of 1 volt at 0.4 in the RA and the RV,  the amplitude was 80 with impedance of 520, the LV impedance was 520  with threshold of 0.5 at 0.4, the battery voltage was 3.16.  There are  no intercurrent episodes.   IMPRESSION:  1. Nonischemic cardiomyopathy.  2. Congestive heart failure - chronic - systolic now class II.  3. Status post CRTD contributing  congestive heart failure.  4. Polyuria, dry mouth, question intercurrent development of diabetes.  5. Carpal tunnel syndrome.   DISCUSSION:  Paul [name] is doing well from a cardiovascular point of  view.  His heart failure is well compensated.   I have suggested that he have a BMET and hemoglobin A1c drawn and this  will be done by the [place] in [place].  Also, I have asked him to follow up  with Dr. Excell Seltzer regarding possible splinting for his right carpal  tunnel.   We will see him again in 3 months' time.     Duke Salvia, MD, [facility]  Electronically Signed    SCK/MedQ  DD: 12/03/2007  DT: 12/04/2007  Job #: [id number]   cc:   Dr. Arthur Holms Administration

## 2010-06-18 NOTE — Cardiovascular Report (Signed)
[facility]                   [facility]IC NOTE   KEE, DRUDGE                       MRN:          [id number]  DATE:05/27/2007                            DOB:          Jan 28, 1941    Mr. Glendinning is status post CRT-D for congestive heart failure in the  setting of mixed ischemic and nonischemic heart disease.  He is vastly  improved and now functions class II.  He unfortunately continues to  smoke although apparently at the [place] they have just given him another  prescription for Chantix, and he is says he is going to start it.   His medications are otherwise notable for:  1. Coreg taking it 12.5 b.i.d. without further up titration since      January.  2. Lisinopril 10.  3. Lasix 40.  4. Omeprazole.  5. Rosuvastatin recently replaced his simvastatin.  6. Aspirin.   On examination, his blood pressure was 121/77, his pulse was 70.  His  weight was 160.  He was in no acute distress.  His lungs were clear.  His neck veins were flat.  His heart sounds were regular without murmur.  The abdomen was soft.  EXTREMITIES:  Were without edema.  SKIN:  Was warm and dry.   Interrogation of his Medtronic Concerta ICD demonstrates a P-wave of 2.4  with impedance of 608, with threshold of 1 volt at 0.4.  The RV  amplitude was 14.9 with impedance of 536, a threshold of 1 volt at 0.4,  and the LV threshold was 1 volt of 0.2 with impedance of 496.  There is  no atrial high rate episodes.   IMPRESSION:  1. Congestive heart failure - chronic - systolic, now class II.  2. Ischemic and nonischemic heart disease.  3. Status post CRT-D for #1 and #2.  4. Ongoing cigarette use and abuse - attempt to stop again with      Chantix.   Mr. Jovel is doing quite well.  He does need further up titration of his  Coreg, and I have asked him to increase his dose to 18.75, and then when  he gets seen again in the next couple of weeks down at the [place] to  have  him further up titrated to 25 mg twice daily if he has tolerated it.   We will see him again in three months' time.     Duke Salvia, MD, [facility]  Electronically Signed    SCK/MedQ  DD: 05/27/2007  DT: 05/27/2007  Job #: [id number]

## 2010-06-18 NOTE — Discharge Summary (Signed)
Paul [name], Paul [name]                ACCOUNT NO.:  [id number]   MEDICAL RECORD NO.:  [id number]           PATIENT TYPE:   LOCATION:                                 FACILITY:   PHYSICIAN:  Maple Mirza, PA   DATE OF BIRTH:  03-Apr-1940   DATE OF ADMISSION:  DATE OF DISCHARGE:                               DISCHARGE SUMMARY   [name] was scheduled here for a procedure at 12:30 and was supposed  to arrive at 70.  He arrived at 10:45 a.m. due to scheduling conflicts.  He was not able to be taken into the electrophysiology lab for a  biventricular cardioverter defibrillator.  The patient was sent home to  be rescheduled on October 14, Tuesday, as the first case.  He is asked  to arrive at 6:15 in the morning.  He has laboratory studies which were  taken on September 29.  They will be out of date.  He has paid for them.  We expect that the new labs which are CBC, BMET, protime, and INR to be  no charge.  The patient will bring his list of medications and will go  to short-stay C at 6:15 on October 14.  A message has been left with the  office to this effect.  The cath lab has this procedure scheduled, and  the patient has been informed of this procedure and then he is to be  n.p.o. after midnight Monday, October 13.      Maple Mirza, PA     GM/MEDQ  D:  11/06/2006  T:  11/07/2006  Job:  [id number]   cc:   Tera Mater. Clent Ridges, MD

## 2010-06-18 NOTE — Assessment & Plan Note (Signed)
[facility]                         ELECTROPHYSIOLOGY OFFICE NOTE   [name], [name]                       MRN:          [id number]  DATE:12/03/2006                            DOB:          [birth year]    Paul [name] was seen in the clinic today, on December 03, 2006, for a wound  check of his newly implanted Medtronic Model [address].  Date of  implant was November 17, 2006, for nonischemic cardiomyopathy.  On  interrogation of his device today his battery voltage was 3.21 with a  charge time of 9 seconds.  P waves measured 2.4 to 3 millivolts with an  atrial capture threshold of 0.5 volts at 0.4 milliseconds and an atrial  lead impedance of 472 ohms.  R waves measured 15.7 millivolts with a  right ventricular pacing threshold of 1.5 volts at 0.4 milliseconds and  a right ventricular lead impedance of 424 ohms.  Left ventricular pacing  threshold was 0.5 volts at 0.4 milliseconds with a left ventricular lead  impedance at 408 ohms.  Shock impedance was 47.  Underlying rhythm today  was a sinus rhythm at 71 beats a minute.  He is ventricularly pacing  about 96% of the time.  Steri-Strips were removed.  His wound is without  redness or edema, no changes were made in his parameters and he is going  to followup in January in the [place] office.      Altha Harm, LPN  Electronically Signed      Duke Salvia, MD, [facility]  Electronically Signed   PO/MedQ  DD: 12/03/2006  DT: 12/03/2006  Job #: [id number]

## 2010-06-18 NOTE — Letter (Signed)
August 10, 2006    Paul Abed, MD, [facility]  [address]  [address]  [place], [place] [address]   RE:  Paul [name], Paul [name]  MRN:  [id number]  /  DOB:  1940-12-03   Dear Trey Paula:   It was a pleasure to see Paul [name] at your request today.  He is a  very interesting gentleman as you know who presented to [facility] in April very, very ill.  He has subsequently lost 50 pounds  which I assume was in large part a diuresis.  Interestingly, his initial  electrocardiogram demonstrates a supraventricular tachycardia as does  the one a couple of hours later.  Electrocardiograms are very hard to  interpret, however, because of what appears to be a very striking first  degree AV block of approximately 440 msec.  He also has underlying left  bundle branch block.   Also interestingly, his symptoms of shortness of breath and fatigue and  nocturnal dyspnea and orthopnea have markedly improved since his  hospitalization concurrent with his 50-pound weight loss.  However, it  is notable that the echocardiogram  recorded in April demonstrated mild  mitral regurgitation and a repeat electrocardiogram noted the end of  last month demonstrated possibly severe mitral regurgitation with an  ejection fraction of 25%.   The patient denies antecedent history of syncope or palpitations.  He  was unaware of palpitations actually when he went to the hospital.   PAST MEDICAL HISTORY:  In addition to the above, is notable for  1. COPD.  2. History of a lung nodule followed at the [place].  3. Hypertension.  4. Dyslipidemia.  5. Gout.  6. Chronic migraines.  7. Orthostatic intolerance.  8. Anxiety.   PAST SURGICAL HISTORY:  1. Tonsillectomy.  2. Appendectomy.   SOCIAL HISTORY:  He is married.  He has one child.  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   CURRENT MEDICATIONS:  1. Aspirin 325.  2. Coreg 6.25 b.i.d.  3. Lisinopril 5.  4. Lasix 40.  5. K-Dur.  6. Zomig 2.4.  7. Diclofenac.  8. Folic acid.  9. Omeprazole.  10.Simvastatin 40.  11.Finasteride 5.   ALLERGIES:  He is allergic to MORPHINE.   REVIEW OF SYSTEMS:  Apart from the above, is broadly negative.   PHYSICAL EXAMINATION:  GENERAL APPEARANCE:  He is an elderly Caucasian  male appearing his stated age of 44.  VITAL SIGNS:  His blood pressure is 111/82, pulse 76.  HEENT:  No icterus, no xanthoma.  NECK:  The neck veins were flat.  The carotids were brisk and full  bilaterally without bruits.  BACK:  Without kyphosis or scoliosis.  LUNGS:  Clear.  CARDIOVASCULAR:  Regular with a posterior murmur.  ABDOMEN:  Soft with active bowel sounds.  EXTREMITIES:  Femoral pulses 2+, distal pulses intact.  There is no  clubbing, cyanosis, or edema.  NEUROLOGIC:  Grossly normal.  SKIN:  Warm and dry.   Electrocardiogram dated today demonstrates what appears to be sinus  rhythm with first degree AV block with a PR interval of about 440 msec.  QRS duration was 150 msec, QT interval was prolonged at 480 msec.  Axis  was leftward at -75.   Catheterization was undertaken in April 2008, demonstrating modest  obstructive disease but without high grade stenosis with ejection  fraction about 35% at that time.  Mitral regurgitation was not commented  on.  There was severe discrete wall motion abnormalities.   IMPRESSION:  1. Ischemic and nonischemic cardiomyopathy with an ejection fraction      of previously 35, now 25%.  2. Mitral regurgitation previously mild, now recorded as severe.  3. 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  4. Class II congestive failure.  5. Chronic obstructive pulmonary disease.  6. 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   Trey Paula, Mr. Kerschner has a big issue with his mitral valve, if in fact the  change in his mitral valve is as  striking as is described.  We will  await your tee to look at that.  I do not think he would be a surgical  candidate.  Maybe if there was global dilatation, he might be a  candidate for one of these new coronary sinus rings.   In the event that that is not the course that is pursued, consideration  should be given to CRT-D implantation for his severe conduction system  disease, his congestive heart failure in the setting of left bundle  branch block and a defibrillator for primary prevention of sudden death.   Furthermore, we would have to consider the possibility that he has  supraventricular tachycardia, although if he had a defibrillator  implanted, antitachycardic pacing might be able to help [place] treat that.  The other issue is whether it may have contributed to his cardiomyopathy  with silent and recurrent supraventricular tachycardia.   Trey Paula, thanks very much for asking [place] to see him.    Sincerely,      Duke Salvia, MD, [facility]  Electronically Signed    SCK/MedQ  DD: 08/10/2006  DT: 08/10/2006  Job #: [id number]

## 2010-06-18 NOTE — Op Note (Signed)
Paul [name], Paul [name]                ACCOUNT NO.:  [id number]   MEDICAL RECORD NO.:  [id number]          PATIENT TYPE:  INP   LOCATION:  4707                         FACILITY:  [facility]   PHYSICIAN:  Duke Salvia, MD, FACCDATE OF BIRTH:  03/01/1940   DATE OF PROCEDURE:  11/17/2006  DATE OF DISCHARGE:                               OPERATIVE REPORT   PREOPERATIVE DIAGNOSIS:  First degree AV block with left bundle branch  block, congestive heart failure, depressed left ventricular function.   POSTOPERATIVE DIAGNOSIS:  First degree AV block with left bundle branch  block, congestive heart failure, depressed left ventricular function.   PROCEDURE:  Dual chamber defibrillator implantation with left  ventricular lead placement and intraoperative defibrillation and  threshold testing.   PROCEDURE IN DETAIL:  Following obtaining informed consent, the patient  was brought to the electrophysiology laboratory and placed on the  fluoroscopic table in a supine position.  After routine prep and drape  of the left upper chest, lidocaine was infiltrated in the prepectoral  subclavicular region.  An incision was made and carried down through the  layers to the prepectoral fascia with electrocautery and sharp  dissection.  A pocket was formed similarly.  Hemostasis was obtained.   Thereafter attention was turned to gaining access to the extrathoracic  left subclavian vein which was accomplished without difficulty without  the aspiration of air or puncture of the artery.  Three separate  venipunctures were accomplished.  Guidewires were placed and retained  and sequentially a 9 [place], 9.5 [place] and 7 [place] airway introducer  sheaths were placed through which was passed initially a Medtronic  Sprint Quattro secure 69 47 dual coil active fixation defibrillator lead  serial number [id number].  Through the second sheath was passed the  coronary sinus MB2 catheter and through the third sheath was passed  a  Medtronic 5076 52 cm active fixation atrial lead serial number  [id number].   Under fluoroscopic guidance the right ventricular lead was manipulated  at the apex where the bipolar R wave was 20.9 with a pace impedance of  784 ohms and a threshold of 1 volt at 0.5 milliseconds.  Current of  threshold was 1.5 mA.  The current of injury was brisk and there was no  diaphragmatic pacing at 10 volts.   The coronary sinus was cannulated with little difficulty.  Contrast  venograms demonstrated a mid lateral branch that was very circuitous and  quite small and a higher lateral branch.  It was elected to pursue the  latter.  We were able to cannulate it with a whisper wire.  We then  passed a St. Jude 1156T lead over the wire into position.  It settled at  a location near the junction between the mid and basilar third.  In this  location parameters were quite good.  The 9.5 French sheath was removed.  This lead was left in place with the MB2 sheath and the right atrial  lead was placed in the right atrial appendage where the bipolar P wave  was 4.1 with a pace impedance of 788,  a threshold of 1.5 volts at 0.5  milliseconds and the current of threshold was 2.4 mA.  There was no  diaphragmatic pacing at 10 volts.  This lead was secured to the  prepectoral fascia and then we went to remove the sheath.  We removed  the delivery sheath.  With this manipulation and the guidewire in place  the lead withdrew into the basilar third of this high lateral branch.  Given the external effect it was felt that this lead would be unstable.  Attempts to readvance it were unsuccessful and so we then had to begin  again.   Venous cannulation was again obtained.  An MB2 cannulation catheter was  used to recannulate the coronary sinus using the in situ CS lead as an  identifier.  We then took a whisper wire and ran it into the previously  identified high lateral branch parallel with the wire that was still   there.  We were able to manipulate this wire down to the LV apex.   Rather than using a camber fixed lead, I elected to use a Guidant EZTRAK  wedge fixation lead which was manipulated then into the junction between  the mid and proximal third.  With this lead the bipolar R wave was 4.6  with a pace impedance of 1023 and a threshold of 0.6 volts at 0.5  milliseconds.  Current of threshold was 0.8 mA and there was no  diaphragmatic pacing at 10 volts.  I should note that this configuration  was distal tip to RV coil allowing for stimulation of the left  ventricular free wall as close to mid ventricle as we could.  With these  successful parameters recorded, the leads were attached to a Concerto  [id number] triple chamber defibrillator serial number [id number].  Through  the device the bipolar P wave was 2.3 with a pace impedance of 648 and a  threshold of 1 volt at 0.2.  The R wave was 19 with a pace impedance of  544, a threshold of 1.5 volts at 0.4 and the LV impedance of 456 with a  threshold of 1 volt at 0.1.  High voltage impedances were 45/54.   The pocket was copiously irrigated with antibiotic containing saline  solution.  Hemostasis was assured and the leads and the pulse generator  were then placed in the pocket and secured to the prepectoral fascia.  Defibrillation threshold testing was undertaken.  Ventricular  fibrillation was induced via the T wave shock.  After a total duration  of 8 seconds a 15 joule shock was delivered through a measured  resistance of 42 ohms terminating ventricular fibrillation and restoring  a sinus rhythm.  The device was implanted.  Surgicel was used along the  cephalad portion of the wound.  The wound was closed in three layers in  the normal fashion.  The wound was washed, dried and a benzoin Steri-  Strip dressing was applied.  Needle counts, sponge counts and instrument  counts were correct at the end of the procedure according to the staff.  The  patient tolerated the procedure without apparent complication.      Duke Salvia, MD, [facility]  Electronically Signed     SCK/MEDQ  D:  11/17/2006  T:  11/18/2006  Job:  [id number]

## 2010-06-18 NOTE — Cardiovascular Report (Signed)
[facility]                   [facility] NOTE   GERREN, HOFFMEIER                       MRN:          [id number]  DATE:08/20/2007                            DOB:          [birth year]    Mr. Newbern is seen in followup for CRTD implantation for congestive heart  failure, and from a breathing point of view, he is doing pretty well.  We had tried to arrange for uptitration of his beta-blocker at his last  visit.  This was complicated, however, by lightheadedness and subsequent  syncope.  He has down titrated his beta-blocker back to his baseline and  he is doing quite well.   He has laboratory work that is pending via Dr. Myrtis Ser and the [facility]  which they will be following up on.   His medications otherwise include Coreg now at 12.5 b.i.d., lisinopril  10, Lasix 40, aspirin or simvastatin at 40, and variety of other  noncardiac meds.   On examination today, his blood pressure was 129/88 and his pulse was  72.  His lungs were clear.  His heart sounds were regular.  His  extremities were without edema and he still does cigarette smoke.  He  says he is working on cutting down the cigarettes.  He was not able to  continue the Chantix, but he is down to 4 or 5 cigarettes a day.   IMPRESSION:  1. Ischemic and nonischemic cardiomyopathy.  2. Congestive heart failure - class II to III status post cardiac      resynchronization therapy defibrillator for ischemic and      nonischemic cardiomyopathy.  3. Intolerance of higher doses of beta-blocker secondary to syncope      and presyncope.   Mr. Rubinstein is doing quite well.  I will plan to see him again in 6  months' time in the Device Clinic.  He is to follow up with Dr. Myrtis Ser as  previously scheduled.     Duke Salvia, MD, [facility]  Electronically Signed    SCK/MedQ  DD: 08/20/2007  DT: 08/21/2007  Job #: [id number]

## 2010-06-18 NOTE — Assessment & Plan Note (Signed)
[facility]                          [facility]   ANTHONNY, SCHILLER                       MRN:          [id number]  DATE:08/04/2008                            DOB:          September 03, 1940    HISTORY OF PRESENT ILLNESS:  Paul [name] is here and feeling well.  He is  very talkative today.  He says that he is smoking only one third of a  pack per day.  The room smells more like 3 packs per day.  He is not  having chest pain.  He says that he does have problems with carpal  tunnel.   The patient has mixed ischemic and nonischemic cardiomyopathy.  His  ejection fraction historically has been in the 20-25% range.  A 2-D echo  last done in July 2009 revealed significant improvement.  The ejection  fraction was estimated at 50-55%.  There was mild-to-moderate mitral  regurgitation.  We are certainly pleased with this data.  He has single-  vessel coronary artery disease by history.  He had an episode of  syncope, but this was felt due to orthostasis and he has been quite  stable.  Also historically, he had significant mitral regurgitation.  As  outlined above, his more recent echo revealed only mild-to-moderate  mitral regurgitation.   The patient is going about full activities.  He is not having any  significant chest pain.  He has not had any recurrent syncope.   PAST MEDICAL HISTORY:   ALLERGIES:  MORPHINE.   MEDICATIONS:  1. Aspirin 325.  2. Omeprazole.  3. Folic acid.  4. Diclofenac.  5. Coreg 12.5 b.i.d.  6. Rosuvastatin.  7. Xanax.  8. Lisinopril 10.  9. Lasix 40.   OTHER MEDICAL PROBLEMS:  See the complete list below.   REVIEW OF SYSTEMS:  The patient denies any fevers or chills.  He does  have areas of a ecchymosis that he says are related to bumping  himself.  He is not having any shortness of breath.  There is no cough.  He does have some discomfort using his hands that is probably related to  his carpal tunnel.  He is  not having any GI or GU symptoms.  All other  systems are reviewed and are negative.   PHYSICAL EXAMINATION:  VITAL SIGNS:  Blood pressure 120/79 with pulse of  65.  GENERAL:  The patient is oriented to person, time, and place.  Affect is  normal.  HEENT:  Reveals no xanthelasma.  He has normal extraocular motion.  There is poor dentition.  He smells of cigarette smoke.  NECK:  Reveals no carotid bruits.  There is no jugular venous  distention.  LUNGS:  Clear.  Respiratory effort is not labored.  CARDIAC:  Reveals an S1 with an S2.  There are no clicks or significant  murmurs.  ABDOMEN:  Soft.  EXTREMITIES:  He has no significant peripheral edema.   EKG is done today and is reviewed by me.  He has a fully paced.  Review  of the 2-D echo done in July 2009  reveals mild LVH.  Ejection fraction  50-55%.  Mild-to-moderate mitral regurgitation.  ICD wire is seen in  place.  Mild tricuspid regurgitation.   PROBLEMS:  Include  1. History of a ischemic and nonischemic cardiomyopathy.  In the past      his ejection fraction was 20-25%.  He has resynchronization      therapy.  His most recent echocardiogram shows good left      ventricular function.  We of course will continue his medications.  2. Status post cardiac resynchronization therapy with an implantable      cardioverter-defibrillator in place.  He follows with Dr. Graciela Husbands and      has an appointment to see him in followup.  3. Chronic congestive heart failure, which is under good control and      he is now class I-II.  4. History of single-vessel coronary artery disease.  5. History of syncope in the past, most probably due to orthostasis      and he is not having recurrent problems.  6. History of mitral regurgitation that appeared to be severe in the      past, but now more recently appears to be better.  This is probably      compatible with improvement in his left ventricular function.  7. History of hypertension, stable.  8.  Hyperlipidemia being treated.  9. History of migraines.  The patient could not tolerate Chantix, as      it made his headaches worse.  10.History of a lung nodule followed by his primary physician at the      [facility].  11.Chronic obstructive pulmonary disease with ongoing cigarette use.      I had a full discussion with him encouraging him to cut down      further.  He is stable.  I will not be changing his meds at this      time.  Cardiology followup will be in 6 months.     Luis Abed, MD, [facility]  Electronically Signed    JDK/MedQ  DD: 08/04/2008  DT: 08/05/2008  Job #: [id number]   cc:   Raye Sorrow, DO

## 2010-06-18 NOTE — Assessment & Plan Note (Signed)
[facility]                          [facility]   JAWAAN, ADACHI                       MRN:          [id number]  DATE:12/22/2006                            DOB:          1940-06-27    Mr. Bertha is doing very well.  Over time, there was a very careful  decision about the placement of a bi-V ICD.  He has this unit in place.  He has been seen back in the device clinic, and he will be seen by Dr.  Graciela Husbands here in the [place] office in January.  He is feeling extremely well.  He is very Adult nurse, and he is going about full activities.   PAST MEDICAL HISTORY:   ALLERGIES:  MORPHINE.   MEDICATIONS:  Aspirin, Coreg, Lasix, K-Dur, diclofenac, folic acid,  omeprazole, multivitamin, simvastatin, finasteride, lisinopril.   OTHER MEDICAL PROBLEMS:  See the list below.   REVIEW OF SYSTEMS:  He has had remarkable improvement.  He is extremely  thrilled with how he is feeling.  He is not having chest pain.  He has  no GI or GU symptoms.  His review of systems is negative.   PHYSICAL EXAM:  Weight is 157 pounds, blood pressure 128/84 with a pulse  of 70.  The patient is oriented to person, time, and place.  Affect is  normal.  HEENT:  No xanthelasma.  He has normal extraocular motion.  There are no carotid bruits.  There is no jugular venous distention.  LUNGS:  Clear.  Respiratory effort is not labored.  CARDIAC:  An S1 with an S2 with a soft systolic murmur.  ABDOMEN:  Soft.  He has no masses or bruits.  He has no peripheral edema.  There are good distal pulses.  There are no  musculoskeletal deformities.  His ICD site is nicely healed.  On the upper lateral corner of the ICD,  there may be the remainder of a very small tip of a suture.  I have  chosen not to pull it out.  This can be rereviewed in January if it is  still present when he sees Dr. Graciela Husbands.   Problems include:  1. Ischemic and nonischemic cardiomyopathy, ejection  fraction      historically in the 25% range.  Over time, we will repeat a 2 D      echocardiogram but not at this point.  I am hopeful that he may      have improvement in his left ventricular function over time.  2. History of class IIB [place] [employer].  At this time, I      do think that he is now most probably no longer a class II patient.  3. 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  4. Severe mitral regurgitation with annular dilatation and poor      coaptation of the mitral leaflets.  We will follow this over time.      We felt that he did not want to have mitral valve surgery.  We will      see how his mitral regurgitation  progresses over time.  5. History of chronic obstructive pulmonary disease.  Unfortunately,      he continues to smoke, and he is urged to stop.  6. History of a lung nodule in the past.  7. Hypertension.  8. Dyslipidemia.  9. Benign prostatic hypertrophy.  10.Gout.  11.History in the past of significant alcohol use, although stable at      this time.  12.Tobacco use as outlined.  13.Chronic migraines.  14.Left bundle-branch block with a very long PR interval.  15.Status post placement of a Medtronic Concerto device.  He is doing      very well, and he will see Dr. Graciela Husbands back in January in the [place]      office.   The patient is on all of the appropriate medications for his  cardiomyopathy.  The dosing is not adjusted as of today.  I will see him  in followup in 6 months.     Luis Abed, MD, [facility]  Electronically Signed    JDK/MedQ  DD: 12/22/2006  DT: 12/22/2006  Job #: [id number]   cc:   Andee Lineman, MD

## 2010-06-18 NOTE — Assessment & Plan Note (Signed)
[facility]                          [facility]   DAVION, FLANNERY                       MRN:          [id number]  DATE:07/31/2006                            DOB:          1940-04-18    Mr. Borthwick is seen for follow up in the office. He had initially been  seen at [facility] with a wide complex rhythm, respiratory  failure, and an ejection fraction of 25%. He was treated and ultimately  sent to [facility] for further evaluation. There is an extensive note in  the chart dated 06/05/2006 by Tereso Newcomer, PAC outlining the course. The  patient underwent catheterization. It was felt that he most likely had  mixed ischemic and non-ischemic cardiomyopathy. He was treated medically  for his coronary disease, and he was allowed to go home. He was seen  back in the office on 06/05/2006. It was felt that he should have a follow  up echo to reassess his LV function. This echo was done on 07/13/2006. The  ejection fraction appeared to be in the range of 25%. Doppler analysis  suggested severe mitral regurgitation. Pisa data revealed an ERO of 0.24  and a regurgitant volume of 34. However, it was my impression overall  that the mitral regurgitation was probably severe. There was moderately  severe pulmonary hypertension and the left ventricle was mildly dilated.  Before the echo was done, plan was arranged for the patient to see Dr.  Graciela Husbands for EP evaluation on July 16. With the additional information of  the echo, there will be need for the patient to have a TEE to help with  the overall workup. This is in the process after today of being  scheduled.   Today, Mr. Tangeman feels well. He says that he is active. He is not having  marked shortness of breath and he is going about reasonable activities.  He has had no syncope and no significant palpitations.   PAST MEDICAL HISTORY:   ALLERGIES:  MORPHINE.   MEDICATIONS:  1. Aspirin.  2.  Coreg.  3. Lisinopril.  4. Lasix.  5. Potassium.  6. Diclofenac.  7. Omeprazole.  8. Simvastatin.  9. Finasteride.   OTHER MEDICAL PROBLEMS:  See the extensive list on the note of 06/05/2006.   REVIEW OF SYSTEMS:  He feels well today and has no significant  complaints. His review of systems otherwise is negative.   PHYSICAL EXAMINATION:  VITAL SIGNS:  Blood pressure 129/79 with a heart  rate of 83. Weight is 164 pounds.  GENERAL:  The patient is oriented to person, time, and place, and his  affect is normal.  HEENT:  There is no xanthelasma. There is normal extraocular motion.  NECK:  There are no carotid bruits. There is no jugular venous  distension.  LUNGS:  Clear. Respiratory effort is not labored.  CARDIAC:  Reveals an S1 with an S2. There are no clicks or significant  murmurs.  ABDOMEN:  Soft. He has normal bowel sounds.  EXTREMITIES:  He has no significant peripheral edema.   No labs are done  today.   PROBLEMS:  1. 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 Most recent      ejection fraction by echo read on 07/13/2006 of 25%.  2. Systolic congestive heart failure with class 1 symptoms.  3. 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  4. Chronic obstructive pulmonary disease.  5. History of a lung nodule followed by his doctor at the [facility].  6. Hypertension.  7. Hyperlipidemia.  8. BPH.  9. Gout.  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  11.History of tobacco abuse. He has quit since his last      hospitalization.  12.Chronic migraines. He is on disability through the [place].  13.Left bundle branch block.  14.Mitral regurgitation. As noted above, it was my feeling from his      echo that his mitral regurgitation was severe. He will have a TEE      to assess this further. We will keep the appointment for him to see      Dr. Graciela Husbands for EP evaluation. He will then be seen for further      titration of his medications. He  is doing well.     Luis Abed, MD, [facility]  Electronically Signed    JDK/MedQ  DD: 07/31/2006  DT: 08/01/2006  Job #: [id number]   cc:   Andee Lineman, MD

## 2010-06-18 NOTE — Assessment & Plan Note (Signed)
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  The patient was found to have a wide complex tachycardia, this  appears to be in retrospect secondary to supraventricular rhythm with  varied long PR intervals of 440 milliseconds with a left bundle branch  block morphology.  In the interim for the latter the patient has been  evaluated by Dr. Graciela Husbands who suggested that after evaluation of the  patient's mitral valve disease he may be a candidate for CRPD therapy.  Dr. Myrtis Ser has been following the patient after his hospitalization at  [facility].  He was referred for a catheterization following this, it  demonstrated nonobstructive coronary artery disease and a mixed non  ischemic, ischemic cardiomyopathy with an ejection fraction of 20-25%.  He was found also to have significant mitral regurgitation.  By echo  however, Dr. Myrtis Ser was concerned that this was quite severe and he  referred the patient to myself for a transesophageal echocardiographic  study to determine the etiology and quantify his degree of mitral  regurgitation.  Indeed it turned out that the patient had terrible  mitral regurgitation and followed him of greater than 100 mL.  There  were two separate jets and there was flow reversal in the pulmonary  veins noted.  The ejection fraction was 20-25%.  Interestingly, the  patient however is now an NYHA class 2B after aggressive medical  therapy.  The patient is now on diuretic therapy with Lasix, also  Lisinopril and Coreg.  Today he was unfortunately mistakenly scheduled  for  myself instead of Dr. Myrtis Ser.  The patient also does not know if he  was supposed to have a follow up appointment with Dr. Graciela Husbands.   MEDICATIONS:  1. Aspirin 325 mg daily.  2. Coreg 6.25 mg p.o. b.i.d.  3. Lisinopril 5 mg daily.  4. Lasix 40 mg p.o. daily.  5. K-Dur 20 mEq p.o. daily.  6. Diclofenac 75 mg p.o. b.i.d.  7. Folic acid.  8. Omeprazole 20 mg p.o. daily.  9. Multivitamin.  10.Simvastatin.  11.Fenestra 5 mg p.o. daily.   PHYSICAL EXAMINATION:  VITAL SIGNS:  Blood pressure 128/86, heart rate  69, weight 164 pounds.  HEENT:  Normal.  NECK:  Normal carotid upstrokes, no carotid bruits. There is 7 cm JVD.  LUNGS:  Clear bilaterally but somewhat diminished.  HEART:  Regular rate and rhythm, normal S1, S2 and a 2/6 holosystolic  murmur at the apex.  ABDOMEN:  Soft.  EXTREMITIES:  No edema.   PROBLEM LIST:  1. Ischemic/non ischemic cardiomyopathy, ejection fraction 25%.  2. Status post acute systolic heart failure.  3. New York Heart Association  class 2-B symptoms.  4. Single vessel coronary  artery disease with medical treatment as      outlined by Dr. Myrtis Ser.  5. Severe mitral regurgitation secondary to mitral annular dilatation      and poor coaptation on the mitral valve leaflets.  6. History of chronic obstructive pulmonary disease.  7. History of lung nodule.  8. Hypertension.  9. Dyslipidemia.  10.Benign prostatic hypertrophy.  11.Gout.  12.History of significant alcohol use.  13.Tobacco use.  14.Chronic migraines.  15.Left bundle branch block and very long PR interval.  P   PLAN:  1. The patient presents a complicated situation.  The patient himself      is not inclined to consider any type of open surgical procedure.      I also do agree with him, given his poor ejection fraction, the      patient would be at high risk for mitral valve repair.  However, we      could pursue the possibility of a stitch procedure or coronary      sinus ring procedure.  I will  contact Dr. Modesto Charon at [facility]      after talking with Dr. Myrtis Ser who agrees, to see if the patient could      be a candidate for the [facility] trial.  2. In the interim I have increased the patient's Lisinopril and      continue to treat him medically.  Fortunately his symptoms are      quite stable and he appears to be at most an NYHA class 2 #3.  I      will also arrange followup with Dr. Graciela Husbands as the decision will need      to be made regarding CRPD treatment for this patient.     Learta Codding, MD,FACC  Electronically Signed    GED/MedQ  DD: 09/03/2006  DT: 09/03/2006  Job #: [id number]   cc:   Luis Abed, MD, [facility]  Duke Salvia, MD, [facility]

## 2010-06-21 NOTE — Assessment & Plan Note (Signed)
[facility]                          [facility]   Paul [name], Paul [name]                       MRN:          [id number]  DATE:06/05/2006                            DOB:          12-12-1940    PRIMARY CARDIOLOGIST:  He will new to Dr. Andee Lineman.   PRIMARY CARE PHYSICIAN:  Dr. Andee Lineman, [facility].   HISTORY OF PRESENT ILLNESS:  Paul [name] is a 70 year old, male patient  who presents to our office today for post hospitalization followup. He  was recently seen in [facility] with acute pulmonary edema.  Echocardiogram revealed an EF of 25-30% and he was transferred to [facility] for further evaluation and treatment. He ruled out for  myocardial infarction by enzymes. He underwent cardiac catheterization  by Dr. Excell Seltzer. This revealed single vessel CAD with 40% ostial  circumflex stenosis, 50% ostial first obtuse marginal branch stenosis  with distal occlusion that filled right to left and a true AV groove  circumflex with 80% stenosis. His EF was 35%. There was moderate global  hypokinesis with akinesis of the distal aspect of the inferolateral wall  and severe hypokinesis of the anterior apex. It was felt that the  patient had likely mixed nonischemic and ischemic cardiomyopathy as his  LV dysfunction was out of proportion to his amount of coronary artery  disease. She was treated medically. He stabilized and was discharged to  home on May 15, 2006. He returned to the office today and notes that  he is doing well. He says he feels better than he has felt in quite some  time. He denies any shortness of breath or chest pain. He denies any  syncope or near syncope. He denies any orthopnea or paroxysmal nocturnal  dyspnea. He does have some chronic lower extremity edema without change.  He denies palpitations. He has been weighing himself daily and there has  been no change in his weights. He is  sticking to a low salt diet as  well.   CURRENT MEDICATIONS:  1. Aspirin 325 mg daily.  2. Coreg 6.25 mg b.i.d.  3. Lisinopril 5 mg daily.  4. Lasix 40 mg daily.  5. K-Dur 20 mEq daily.  6. Diclofenac 75 mg twice daily.  7. Folic acid.  8. Omeprazole 20 mg daily.  9. Multivitamin daily.  10.Simvastatin 40 mg - this was apparently increased to 80 mg recently      q.h.s.  11.Zomig p.r.n.  12.Finasteride 5 mg daily.  13.Nitroglycerin p.r.n.  14.Albuterol p.r.n.  15.Propoxyphene p.r.n.   ALLERGIES:  MORPHINE.   PHYSICAL EXAMINATION:  GENERAL:  He is a well-nourished, well-developed  male in no acute distress.  VITAL SIGNS:  Blood pressure is 125/85, pulse 88, weight 173 pounds.  HEENT:  Unremarkable.  NECK:  Without JVD.  CARDIAC:  Normal S1, S2. Regular rate and rhythm without murmurs.  LUNGS:  Clear to auscultation bilaterally without wheezes, rhonchi or  rales.  ABDOMEN:  Soft, nontender with normal active bowel sounds. No  organomegaly.  EXTREMITIES:  Without edema. Calves soft nontender.  SKIN:  Warm and dry.  NEUROLOGIC:  He is alert and oriented x3. Cranial nerves II-XII grossly  intact.   Right femoral arteriotomy site without hematoma or bruit.  Electrocardiogram  reveals sinus rhythm with a very long first-degree AV  block measuring approximately 360 msec, frequent PVCs - the EKG was  reviewed with Dr. Andee Lineman today who agreed.   IMPRESSION:  1. Mixed ischemic and nonischemic cardiomyopathy with an ejection      fraction of 30%.  2. Chronic systolic congestive heart failure.      a.     New York Heart Association class 2 symptoms.  3. Single-vessel coronary artery disease as outlined above.      a.     Cardiomyopathy out of proportion to his amount of coronary       disease.  4. Chronic obstructive pulmonary disease.  5. History of lung nodule.      a.     Followed by primary care physician at the [facility]  6. Hypertension.  7. Hyperlipidemia.  8. Benign  prostatic hypertrophy.  9. Gout.  10.History of alcohol abuse.  11.Tobacco abuse.      a.     He has quit smoking since discharge from the hospital.  12.Chronic migraines.  13.Left bundle branch block.  14.First degree AV block.   PLAN:  The patient presented to the office today for post  hospitalization followup. He was recently admitted with acute on chronic  systolic congestive heart failure. He was diagnosed with mixed ischemic  and nonischemic cardiomyopathy. His cardiomyopathy is actually out of  proportion to his burden of coronary disease. He was treated medically.  He is currently doing well without any complaints of chest pain or  shortness of breath. He actually tells me he feels better than he has  felt in quite some time. The patient does have an abnormal  electrocardiogram . He does have a significant first degree AV block as  well as frequent ectopy and a left bundle branch block. He has not had  any symptoms of syncope or near syncope. His EF is in the 30-35% range.  I discussed this further with Dr. Andee Lineman and we reviewed his  electrocardiogram. At this point in time, we recommend proceeding with a  followup echocardiogram in about 6 weeks' time. We will see the patient  back after that. If his EF continues to remain 35% or lower, he will  likely need a referral to electrophysiology for possible AICD  implantation. He may need a pacemaker at some point in the future as  well. Will check a BMET and magnesium as well given his ectopy. Will  continue all his medications as listed above.      Tereso Newcomer, PA-C       Learta Codding, MD,FACC    SW/MedQ  DD: 06/05/2006  DT: 06/05/2006  Job #: [id number]   cc:   Andee Lineman, MD

## 2010-06-21 NOTE — Cardiovascular Report (Signed)
NAMEBRAHIM, [name]                ACCOUNT NO.:  [id number]   MEDICAL RECORD NO.:  [id number]          PATIENT TYPE:  INP   LOCATION:  2003                         FACILITY:  [facility]   PHYSICIAN:  Veverly Fells. Excell Seltzer, MD  DATE OF BIRTH:  [birth year]   DATE OF PROCEDURE:  05/14/2006  DATE OF DISCHARGE:                            CARDIAC CATHETERIZATION   PROCEDURE:  Left heart catheterization, selective coronary angiography,  left ventricular angiography and StarClose of the right femoral artery.   INDICATIONS:  [name] is a 70 year old gentleman who presented to  [facility] with acute respiratory failure requiring mechanical  intubation.  He was on the ventilator for approximately 4-5 days.  He  has recovered, but during his evaluation was found to have a  cardiomyopathy with global hypokinesis of the left ventricle and  akinesis of portions of the inferior wall.  He was subsequently referred  for cardiac catheterization to rule out ischemic cardiac disease.   Risks and indications of the procedure were explained to the patient.  Informed consent was obtained.  The right groin was prepped, draped and  anesthetized with 1% lidocaine.  Using the modified Seldinger technique,  a 6-French sheath was placed in the right femoral artery.  Multiple  views of the left and right coronary arteries were taken using standard  preformed Judkins catheters.  Following selective coronary angiography,  an angled pigtail catheter was inserted into the left ventricle and  pressures were recorded.  A left ventriculogram was performed.  A  pullback across the aortic valve was done.  At the conclusion of the  case, a StarClose device was used to seal the right femoral artery.   FINDINGS:  Left ventricular pressure 100/6 with an end-diastolic  pressure of 12, aortic pressure 102/67 with a mean of 80.   CORONARY ANGIOGRAPHY:  The left main stem has mild luminal  irregularities.  There is no  significant obstructive disease.  It  bifurcates into the LAD and left circumflex.   The LAD is a large-caliber vessel that courses down to the left  ventricular apex.  The LAD has minor luminal irregularities throughout  the proximal portion.  It then gives off a large diagonal branch from  the midportion of the vessel.  Beyond the diagonal branch in the mid and  distal LAD, there is no significant angiographic disease.  The large  diagonal branch has a minor irregularity in its proximal portion but no  significant disease.   The left circumflex has a 40% ostial stenosis.  It does not appear flow  obstructive.  The left circumflex then gives off a first OM branch that  also has an ostial stenosis of approximately 50%.  That vessel in its  distal aspect appears to be occluded and fills from right to left  collaterals.  The true AV groove circumflex beyond that is fairly small  and has an 80% stenosis.  It then branches into a second and third OM.   The right coronary artery is dominant.  It is a large vessel.  It gives  off a large acute marginal  branch.  Distally, the right coronary artery  branches into a PDA and posterior AV segment.  Those vessels supply a  well-formed collateral to the distal circumflex.  The right coronary  artery has minor luminal irregularities throughout, but does not have  any significant flow-limiting stenoses.   LEFT VENTRICULOGRAPHY:  Left ventriculography performed in the 30 degree  right anterior oblique projection demonstrates moderate global  hypokinesis with akinesis of the distal aspect of the inferolateral wall  and severe hypokinesis of the anteroapex.  The overall left ventricular  ejection fraction is estimated at 35%.   ASSESSMENT:  1. Single-vessel coronary artery disease with a collateralized branch      of the left circumflex.  2. Nonobstructive left anterior descending and right coronary artery      disease.  3. Cardiomyopathy with  moderate left ventricular dysfunction which is      likely a mixed nonischemic and ischemic cardiomyopathy.  The left      ventricular dysfunction is out of portion to the amount coronary      artery disease.   PLAN:  Recommend continued medical therapy.  The patient did not present  with an acute coronary syndrome or cardiac enzyme elevation.  I suspect  his coronary artery disease as an incidental finding.  He does have  significant disease in the left circumflex and should be treated with  aggressive medical therapy including statin therapy, antiplatelets, beta  blocker and ACE inhibitor.  Will continue to treat his cardiomyopathy,  as well.  He should be ready for discharge tomorrow.      Veverly Fells. Excell Seltzer, MD  Electronically Signed     MDC/MEDQ  D:  05/14/2006  T:  05/14/2006  Job:  [id number]

## 2010-06-21 NOTE — Discharge Summary (Signed)
Paul [name], Paul [name]                ACCOUNT NO.:  [id number]   MEDICAL RECORD NO.:  [id number]          PATIENT TYPE:  INP   LOCATION:  2003                         FACILITY:  [facility]   PHYSICIAN:  Luis Abed, MD, FACCDATE OF BIRTH:  Jun 17, 1940   DATE OF ADMISSION:  05/13/2006  DATE OF DISCHARGE:  05/15/2006                         DISCHARGE SUMMARY - REFERRING   DISCHARGE DIAGNOSES:  1. Acute pulmonary edema requiring intubation and ventilation,      transfer from [facility].  2. Acute systolic congestive heart failure with echocardiogram at      [facility] showing ejection fraction of 25-30%.  3. Nonobstructive coronary artery disease.  4. Sinus tachycardic with first degree AV block, left bundle branch      block of unknown duration.  5. Tobacco use.  6. Chronic obstructive pulmonary disease (COPD).  7. Hyperlipidemia.  8. Remote ETOH.   PROCEDURE PERFORMED:  Cardiac catheterization on 05/14/2006 by Dr.  Excell Seltzer.   SUMMARY OF HISTORY:  Paul [name] is a 70 year old white male who was  admitted to [facility] when he presented to their emergency room  in acute respiratory failure requiring intubation and ventilation.  He  was diuresed and extubated and ruled out for myocardial infarction.  Cardiology was asked to consult in regards to his echocardiogram that  showed a severe cardiomyopathy with EF of 25-30%, normal RV function,  mild MR, inferior and anterior hypokinesis and left ventricular  enlargement and left bundle branch block.  He was transferred to [facility]  for further evaluation from a cardiac standpoint.   PAST MEDICAL HISTORY:  Past medical history is notable for:  1. Tobacco use.  2. COPD.  3. Hypertension.  4. Hyperlipidemia.  5. BPH.  6. Gout.  7. History of alcohol abuse.  Drivers licence was revoked in 1977      secondary to numerous DWI.   LABORATORY DATA:  Chest x-ray on the 9th did not show any acute  findings.  Admission weight to [facility] was  79.7 kg or 175.6 pounds.  Discharge weight was 82.7 kg or 182.7 pounds.  Admission H&H was 14.5  and 42.7, normal indices, platelets 351, WBC 14.9.  Prior to discharge,  H&H 11.7 and 33.7, normal indices, platelets 266, WBC 10.7.  PTT 35, PT  15.4.  Admission sodium was 138, potassium 3.7, BUN 16, creatinine 1.06,  glucose 121.  Normal LFTs.  Prior to discharge, sodium was 137,  potassium 3.9. BUN/creatinine 19 and 1.08, glucose 117.   EKG at [facility] showed normal sinus rhythm, sinus tachycardia, left  bundle branch block.   HOSPITAL COURSE:  The patient was transferred from [facility] for  further evaluation for possible cardiomyopathy.  He was transferred to  [address] on 05/13/2006.  Progressive nurse assisted with education.  Catheterization performed on 05/14/2006 by Dr. Excell Seltzer showed known  obstructive LAD disease, 40% ostial circumflex, 50% OM1, 80% mid  circumflex with right to left flow.  There was lateral akinesis with  mild global hypokinesis.  EF 35%.  Dr. Excell Seltzer felt he had single-vessel  coronary artery disease with collateralization to  the left.  He felt it  was likely a mixed cardiomyopathy and recommended medical treatment.  Post catheterization sight check was performed by Ward Givens and did  not show any changes.   Tobacco cessation performed on 05/14/2006.  On 04/11, the patient was  ambulating in the halls without difficulty.  He stated that he had  progressive cough.  He felt this was related to his smoking.  He denied  any shortness of breath or orthopnea.  Catheterization site was intact.  After review by Dr. Myrtis Ser, it was felt that the patient could be  discharged home.   DISPOSITION:  The patient is discharged home.  Home care and activities  as per supplemental discharge sheet post catheterization.  He also has  received CHF guidelines that includes aspirin, all medications, and all  appointments.  He is advised no smoking or tobacco products  or no  alcohol.   New medications include aspirin 325 mg daily, Coreg 6.25 mg b.i.d.,  lisinopril 5 mg daily, Lasix 40 mg daily, K-Dur 20 mEq daily,  nitroglycerin 0.4 as needed.  He was asked to continue his albuterol  inhaler 4x daily as needed, Zomig 2.5 mg daily, __________ 75 mg b.i.d.,  folic acid daily, omeprazole 20 mg daily, multivitamin daily,  propoxyphene 100 mg q.6h. p.r.n., simvastatin 40 mg q.h.s., finasteride  5 mg daily, and Astelin as previously.  He was advised not to take  verapamil or niacin.   He will follow up with Dr. Andee Lineman in the office in the next 2 weeks.  This appointment will be made prior to his discharge, but at the time of  this dictation the office is not open.  He was also asked to follow up  with his primary care physician.      Joellyn Rued, PA-C      Luis Abed, MD, [facility]  Electronically Signed    EW/MEDQ  D:  05/15/2006  T:  05/15/2006  Job:  [id number]   cc:   Learta Codding, MD,FACC

## 2010-06-21 NOTE — Assessment & Plan Note (Signed)
[facility]                          [facility] OFFICE NOTE   CHARBEL, LOS                       MRN:          [id number]  DATE:09/29/2006                            DOB:          Apr 30, 1940    See the extensive office note from September 28, 2006, concerning Mr.  Risdon.  I have spoken with Dr. Andee Lineman.  We both agree that the  information suggests that Mr. Lazare would not be a candidate for  clipping of his mitral leaflets for his mitral regurgitation.  With this  in mind, it is time to refer the patient back to Dr. Graciela Husbands to proceed  with his ICD implantation.  Information will be passed back to Dr. Graciela Husbands  including yesterday's note and this note.  The patient then can have his  ICD scheduled.     Luis Abed, MD, [facility]  Electronically Signed    JDK/MedQ  DD: 09/29/2006  DT: 09/29/2006  Job #: [id number]   cc:   Duke Salvia, MD, [facility]  Andee Lineman, MD

## 2010-07-05 ENCOUNTER — Encounter: Payer: Self-pay | Admitting: Cardiology

## 2010-07-05 DIAGNOSIS — I340 Nonrheumatic mitral (valve) insufficiency: Secondary | ICD-10-CM | POA: Insufficient documentation

## 2010-07-05 DIAGNOSIS — I447 Left bundle-branch block, unspecified: Secondary | ICD-10-CM | POA: Insufficient documentation

## 2010-07-05 DIAGNOSIS — I272 Pulmonary hypertension, unspecified: Secondary | ICD-10-CM | POA: Insufficient documentation

## 2010-07-05 DIAGNOSIS — I429 Cardiomyopathy, unspecified: Secondary | ICD-10-CM | POA: Insufficient documentation

## 2010-07-05 DIAGNOSIS — I10 Essential (primary) hypertension: Secondary | ICD-10-CM | POA: Insufficient documentation

## 2010-07-05 DIAGNOSIS — N400 Enlarged prostate without lower urinary tract symptoms: Secondary | ICD-10-CM | POA: Insufficient documentation

## 2010-07-05 DIAGNOSIS — E785 Hyperlipidemia, unspecified: Secondary | ICD-10-CM | POA: Insufficient documentation

## 2010-07-05 DIAGNOSIS — J449 Chronic obstructive pulmonary disease, unspecified: Secondary | ICD-10-CM | POA: Insufficient documentation

## 2010-07-05 DIAGNOSIS — G43909 Migraine, unspecified, not intractable, without status migrainosus: Secondary | ICD-10-CM | POA: Insufficient documentation

## 2010-07-05 DIAGNOSIS — R911 Solitary pulmonary nodule: Secondary | ICD-10-CM | POA: Insufficient documentation

## 2010-07-05 DIAGNOSIS — I2510 Atherosclerotic heart disease of native coronary artery without angina pectoris: Secondary | ICD-10-CM | POA: Insufficient documentation

## 2010-07-05 DIAGNOSIS — F17200 Nicotine dependence, unspecified, uncomplicated: Secondary | ICD-10-CM | POA: Insufficient documentation

## 2010-07-05 DIAGNOSIS — R9430 Abnormal result of cardiovascular function study, unspecified: Secondary | ICD-10-CM | POA: Insufficient documentation

## 2010-07-05 DIAGNOSIS — M109 Gout, unspecified: Secondary | ICD-10-CM | POA: Insufficient documentation

## 2010-07-05 DIAGNOSIS — Z4502 Encounter for adjustment and management of automatic implantable cardiac defibrillator: Secondary | ICD-10-CM | POA: Insufficient documentation

## 2010-07-05 DIAGNOSIS — R55 Syncope and collapse: Secondary | ICD-10-CM | POA: Insufficient documentation

## 2010-07-09 ENCOUNTER — Ambulatory Visit (INDEPENDENT_AMBULATORY_CARE_PROVIDER_SITE_OTHER): Payer: Medicare Other | Admitting: Cardiology

## 2010-07-09 ENCOUNTER — Encounter: Payer: Self-pay | Admitting: Cardiology

## 2010-07-09 DIAGNOSIS — R55 Syncope and collapse: Secondary | ICD-10-CM

## 2010-07-09 DIAGNOSIS — I428 Other cardiomyopathies: Secondary | ICD-10-CM

## 2010-07-09 DIAGNOSIS — F17200 Nicotine dependence, unspecified, uncomplicated: Secondary | ICD-10-CM

## 2010-07-09 NOTE — Assessment & Plan Note (Signed)
I have once again counseled the patient to stop smoking.

## 2010-07-09 NOTE — Assessment & Plan Note (Signed)
Patient is on appropriate medications.  He is stable.  No change in therapy.

## 2010-07-09 NOTE — Patient Instructions (Signed)
Your physician you to follow up in 1 year. You will receive a reminder letter in the mail one-two months in advance. If you don't receive a letter, please call our office to schedule the follow-up appointment. Your physician recommends that you continue on your current medications as directed. Please refer to the Current Medication list given to you today. 

## 2010-07-09 NOTE — Progress Notes (Signed)
HPI Patient returns today for followup of cardiomyopathy.  He has a CRT-D device in.  This has been followed and is functioning appropriately.  He has not had any significant discharges.  He continues to smoke 4-5 cigarettes per day and I have counseled him to stop.  He is not having any chest pain or significant shortness of breath.  There has been no syncope or presyncope.  He does have some mild vertigo. Allergies  Allergen Reactions  . Morphine Sulfate     REACTION: internal hemorrage    Current Outpatient Prescriptions  Medication Sig Dispense Refill  . ALPRAZolam (XANAX) 0.25 MG tablet Take 0.25 mg by mouth 2 (two) times daily as needed.        Marland Kitchen aspirin 325 MG tablet Take 325 mg by mouth daily.        . butalbital-acetaminophen-caffeine (FIORICET WITH CODEINE) 50-325-40-30 MG per capsule Take 1 capsule by mouth every 4 (four) hours as needed.        . carvedilol (COREG) 12.5 MG tablet Take 12.5 mg by mouth 2 (two) times daily with a meal.        . divalproex (DEPAKOTE) 500 MG 24 hr tablet Take 1,500 mg by mouth at bedtime.        . finasteride (PROSCAR) 5 MG tablet Take 5 mg by mouth daily.        . folic acid (FOLVITE) 1 MG tablet Take 1 mg by mouth daily.        . furosemide (LASIX) 40 MG tablet Take 40 mg by mouth daily.        . hydroxypropyl methylcellulose (ISOPTO TEARS) 2.5 % ophthalmic solution 1 drop 4 (four) times daily as needed.       Marland Kitchen lisinopril (PRINIVIL,ZESTRIL) 40 MG tablet Take 20 mg by mouth daily.        . Multiple Vitamin (MULTIVITAMIN) tablet Take 1 tablet by mouth daily.        . nitroGLYCERIN (NITROSTAT) 0.4 MG SL tablet Place 0.4 mg under the tongue every 5 (five) minutes as needed.        Marland Kitchen omeprazole (PRILOSEC) 20 MG capsule Take 20 mg by mouth daily.        . potassium chloride SA (K-DUR,KLOR-CON) 20 MEQ tablet Take 20 mEq by mouth daily.        . rosuvastatin (CRESTOR) 40 MG tablet Take 40 mg by mouth daily.        Marland Kitchen tiotropium (SPIRIVA) 18 MCG inhalation  capsule Place 18 mcg into inhaler and inhale every morning.        . traMADol (ULTRAM) 50 MG tablet Take 50 mg by mouth. Take 1 to 2 tablets by mouth every 8 to 12 hours as needed for pain         History   Social History  . Marital Status: Married    Spouse Name: N/A    Number of Children: 4  . Years of Education: N/A   Occupational History  . RETIRED    Social History Main Topics  . Smoking status: Current Everyday Smoker -- 0.3 packs/day for 45 years    Types: Cigarettes  . Smokeless tobacco: Never Used  . Alcohol Use: No  . Drug Use: No  . Sexually Active: Not on file   Other Topics Concern  . Not on file   Social History Narrative  . No narrative on file    No family history on file.  Past Medical History  Diagnosis  Date  . Cardiomyopathy   . ICD (implantable cardiac defibrillator) battery depletion     CRT-D device  . Ejection fraction     Ejection fraction 25%  /  repeat ejection fraction 50%, echo, July, 2009  . Mitral regurgitation     Severe before CRT with annular dilatation and poor coaptation of the mitral leaflets  /  mild to moderate mitral regurgitation, July, 2009, after CRT therapy  . Pulmonary hypertension     44 mmHg, echo, July, 2009  . COPD (chronic obstructive pulmonary disease)   . Smoking     Chantix cause headaches  . Lung nodule     [facility]  . Hypertension   . Dyslipidemia   . BPH (benign prostatic hyperplasia)   . Gout   . Alcohol abuse     In the past.  Stable over many years  . Migraines   . LBBB (left bundle branch block)   . CAD (coronary artery disease)     Catheterization April, 2008, single vessel disease with collateralized branch of the left circumflex  . Syncope     Probably orthostasis    No past surgical history on file.  ROS  Patient denies fever, chills, headache, sweats, rash, change in vision, change in hearing, chest pain, cough, nausea vomiting, urinary symptoms.  All other systems are reviewed  and are negative.  PHYSICAL EXAM Patient is oriented to person time and place.  Affect is normal.  Head is atraumatic.  He has only 3 lower teeth.  He denies any significant oral problems.  There is no jugular venous distention.  Lungs reveal a few scattered rhonchi.  There is no respiratory distress.  Cardiac exam reveals S1 and S2.  There are no clicks or significant murmurs.  The abdomen is soft.  There is no peripheral edema. Filed Vitals:   07/09/10 0908  BP: 118/77  Pulse: 66  Height: 5\' 6"  (1.676 m)  Weight: 154 lb (69.854 kg)    EKG Is not done today.  ASSESSMENT & PLAN

## 2010-07-09 NOTE — Assessment & Plan Note (Signed)
There has been no recurrent syncope.  He is stable.  No further cardiac workup needed at this time.

## 2010-11-13 LAB — BASIC METABOLIC PANEL
CO2: 28
Calcium: 9.2
Creatinine, Ser: 1.01
GFR calc Af Amer: >60

## 2010-11-14 LAB — BASIC METABOLIC PANEL
BUN: 24 — ABNORMAL HIGH
CO2: 26
Chloride: 104
Creatinine, Ser: 1.2
Potassium: 4.3
Sodium: 137

## 2010-11-14 LAB — PROTIME-INR: Prothrombin Time: 14.2

## 2010-11-14 LAB — CBC
HCT: 35.9 — ABNORMAL LOW
Hemoglobin: 12.2 — ABNORMAL LOW
MCHC: 33.9
MCV: 91

## 2010-11-29 ENCOUNTER — Ambulatory Visit (INDEPENDENT_AMBULATORY_CARE_PROVIDER_SITE_OTHER): Payer: Medicare Other | Admitting: *Deleted

## 2010-11-29 ENCOUNTER — Encounter: Payer: Self-pay | Admitting: Internal Medicine

## 2010-11-29 DIAGNOSIS — I428 Other cardiomyopathies: Secondary | ICD-10-CM

## 2010-11-29 DIAGNOSIS — I509 Heart failure, unspecified: Secondary | ICD-10-CM

## 2010-11-29 LAB — ICD DEVICE OBSERVATION
AL AMPLITUDE: 3.1518 mv
AL IMPEDENCE ICD: 448 Ohm
ATRIAL PACING ICD: 0.09 pct
BAMS-0001: 170 {beats}/min
BRDY-0002LV: 50 {beats}/min
CHARGE TIME: 11.501 s
LV LEAD IMPEDENCE ICD: 544 Ohm
LV LEAD THRESHOLD: 1 V
RV LEAD AMPLITUDE: 6.8275 mv
RV LEAD IMPEDENCE ICD: 528 Ohm
TOT-0001: 1
TOT-0002: 0
TZAT-0001ATACH: 1
TZAT-0001ATACH: 2
TZAT-0001ATACH: 3
TZAT-0001FASTVT: 1
TZAT-0002ATACH: NEGATIVE
TZAT-0002ATACH: NEGATIVE
TZAT-0004FASTVT: 8
TZAT-0004SLOWVT: 8
TZAT-0004SLOWVT: 8
TZAT-0005SLOWVT: 88 pct
TZAT-0011SLOWVT: 10 ms
TZAT-0011SLOWVT: 10 ms
TZAT-0012ATACH: 150 ms
TZAT-0012ATACH: 150 ms
TZAT-0012SLOWVT: 200 ms
TZAT-0013FASTVT: 1
TZAT-0013SLOWVT: 2
TZAT-0018ATACH: NEGATIVE
TZAT-0018FASTVT: NEGATIVE
TZAT-0019ATACH: 6 V
TZAT-0019FASTVT: 8 V
TZAT-0019SLOWVT: 8 V
TZAT-0020ATACH: 1.5 ms
TZAT-0020ATACH: 1.5 ms
TZAT-0020FASTVT: 1.5 ms
TZAT-0020SLOWVT: 1.5 ms
TZON-0003FASTVT: 240 ms
TZON-0003SLOWVT: 340 ms
TZON-0003VSLOWVT: 400 ms
TZON-0005SLOWVT: 12
TZST-0001ATACH: 4
TZST-0001ATACH: 6
TZST-0001FASTVT: 2
TZST-0001SLOWVT: 5
TZST-0002ATACH: NEGATIVE
TZST-0002ATACH: NEGATIVE
TZST-0003FASTVT: 30 J
TZST-0003FASTVT: 35 J
TZST-0003FASTVT: 35 J
TZST-0003FASTVT: 35 J
VF: 0

## 2010-11-29 NOTE — Progress Notes (Signed)
icd check in clinic  

## 2011-02-17 ENCOUNTER — Encounter: Payer: Self-pay | Admitting: Internal Medicine

## 2011-02-17 ENCOUNTER — Ambulatory Visit (INDEPENDENT_AMBULATORY_CARE_PROVIDER_SITE_OTHER): Payer: Medicare Other | Admitting: Internal Medicine

## 2011-02-17 DIAGNOSIS — F17200 Nicotine dependence, unspecified, uncomplicated: Secondary | ICD-10-CM | POA: Diagnosis not present

## 2011-02-17 DIAGNOSIS — I509 Heart failure, unspecified: Secondary | ICD-10-CM | POA: Diagnosis not present

## 2011-02-17 DIAGNOSIS — I428 Other cardiomyopathies: Secondary | ICD-10-CM | POA: Diagnosis not present

## 2011-02-17 DIAGNOSIS — I5022 Chronic systolic (congestive) heart failure: Secondary | ICD-10-CM | POA: Insufficient documentation

## 2011-02-17 LAB — ICD DEVICE OBSERVATION
AL AMPLITUDE: 3.2 mv
AL THRESHOLD: 1 V
ATRIAL PACING ICD: 0.09 pct
BAMS-0001: 170 {beats}/min
CHARGE TIME: 11.501 s
FVT: 0
RV LEAD AMPLITUDE: 12.0294 mv
RV LEAD IMPEDENCE ICD: 520 Ohm
RV LEAD THRESHOLD: 1.5 V
TOT-0002: 0
TZAT-0001ATACH: 1
TZAT-0001SLOWVT: 1
TZAT-0002ATACH: NEGATIVE
TZAT-0002ATACH: NEGATIVE
TZAT-0002ATACH: NEGATIVE
TZAT-0004SLOWVT: 8
TZAT-0005SLOWVT: 91 pct
TZAT-0011SLOWVT: 10 ms
TZAT-0011SLOWVT: 10 ms
TZAT-0012ATACH: 150 ms
TZAT-0012ATACH: 150 ms
TZAT-0012FASTVT: 200 ms
TZAT-0012SLOWVT: 200 ms
TZAT-0013FASTVT: 1
TZAT-0018ATACH: NEGATIVE
TZAT-0018ATACH: NEGATIVE
TZAT-0019ATACH: 6 V
TZAT-0019SLOWVT: 8 V
TZAT-0019SLOWVT: 8 V
TZAT-0020ATACH: 1.5 ms
TZAT-0020SLOWVT: 1.5 ms
TZON-0003VSLOWVT: 400 ms
TZON-0004VSLOWVT: 20
TZST-0001ATACH: 4
TZST-0001ATACH: 6
TZST-0001FASTVT: 2
TZST-0001FASTVT: 3
TZST-0001FASTVT: 4
TZST-0001FASTVT: 5
TZST-0001SLOWVT: 4
TZST-0003FASTVT: 35 J
TZST-0003FASTVT: 35 J
TZST-0003FASTVT: 35 J
VENTRICULAR PACING ICD: 99.92 pct
VF: 0

## 2011-02-17 NOTE — Assessment & Plan Note (Signed)
euvolemic on exam, though optivol is elevated Compliance with lasix and 2gram sodium diet is encouraged  Normal BiV ICD function See Pace Art report No changes today

## 2011-02-17 NOTE — Patient Instructions (Signed)
Your physician wants you to follow-up in: 12 months with Dr Johney Frame in [place] will receive a reminder letter in the mail two months in advance. If you don't receive a letter, please call our office to schedule the follow-up appointment.    Remote monitoring is used to monitor your Pacemaker of ICD from home. This monitoring reduces the number of office visits required to check your device to one time per year. It allows [place] to keep an eye on the functioning of your device to ensure it is working properly. You are scheduled for a device check from home on 05/22/11. You may send your transmission at any time that day. If you have a wireless device, the transmission will be sent automatically. After your physician reviews your transmission, you will receive a postcard with your next transmission date.

## 2011-02-17 NOTE — Assessment & Plan Note (Signed)
Cessation advised He is not ready to quit 

## 2011-02-17 NOTE — Progress Notes (Signed)
PCP:  Raye Sorrow, DO, DO Primary Cardiologist:  Myrtis Ser  The patient presents today for routine electrophysiology followup.  Since last being seen in our clinic, the patient reports doing very well.  Today, he denies symptoms of palpitations, chest pain, shortness of breath, orthopnea, PND, lower extremity edema, presyncope, syncope.  He has occasional orthostatic dizziness.  The patient feels that he is tolerating medications without difficulties and is otherwise without complaint today.   Past Medical History  Diagnosis Date  . Cardiomyopathy   . ICD (implantable cardiac defibrillator) battery depletion     CRT-D device  . Ejection fraction     Ejection fraction 25%  /  repeat ejection fraction 50%, echo, July, 2009  . Mitral regurgitation     Severe before CRT with annular dilatation and poor coaptation of the mitral leaflets  /  mild to moderate mitral regurgitation, July, 2009, after CRT therapy  . Pulmonary hypertension     44 mmHg, echo, July, 2009  . COPD (chronic obstructive pulmonary disease)   . Smoking     Chantix cause headaches  . Lung nodule     [facility]  . Hypertension   . Dyslipidemia   . BPH (benign prostatic hyperplasia)   . Gout   . Alcohol abuse     In the past.  Stable over many years  . Migraines   . LBBB (left bundle branch block)   . CAD (coronary artery disease)     Catheterization April, 2008, single vessel disease with collateralized branch of the left circumflex  . Syncope     Probably orthostasis   Past Surgical History  Procedure Date  . Cardiac defibrillator placement     BiV ICD implanted    Current Outpatient Prescriptions  Medication Sig Dispense Refill  . ALPRAZolam (XANAX) 0.25 MG tablet Take 0.25 mg by mouth 2 (two) times daily as needed.        Marland Kitchen aspirin 325 MG tablet Take 325 mg by mouth daily.        . butalbital-acetaminophen-caffeine (FIORICET WITH CODEINE) 50-325-40-30 MG per capsule Take 1 capsule by mouth every 4  (four) hours as needed.        . carvedilol (COREG) 12.5 MG tablet Take 12.5 mg by mouth 2 (two) times daily with a meal.        . divalproex (DEPAKOTE) 500 MG 24 hr tablet Take 1,500 mg by mouth at bedtime.        . finasteride (PROSCAR) 5 MG tablet Take 5 mg by mouth daily.        . folic acid (FOLVITE) 1 MG tablet Take 1 mg by mouth daily.        . furosemide (LASIX) 40 MG tablet Take 40 mg by mouth daily.        . hydroxypropyl methylcellulose (ISOPTO TEARS) 2.5 % ophthalmic solution 1 drop 4 (four) times daily as needed.       Marland Kitchen lisinopril (PRINIVIL,ZESTRIL) 40 MG tablet Take 20 mg by mouth daily.        . Multiple Vitamin (MULTIVITAMIN) tablet Take 1 tablet by mouth daily.        . nitroGLYCERIN (NITROSTAT) 0.4 MG SL tablet Place 0.4 mg under the tongue every 5 (five) minutes as needed.        Marland Kitchen omeprazole (PRILOSEC) 20 MG capsule Take 20 mg by mouth daily.        . potassium chloride SA (K-DUR,KLOR-CON) 20 MEQ tablet Take 20  mEq by mouth daily.        . rosuvastatin (CRESTOR) 40 MG tablet Take 40 mg by mouth daily.        . traMADol (ULTRAM) 50 MG tablet Take 50 mg by mouth. Take 1 to 2 tablets by mouth every 8 to 12 hours as needed for pain       . traZODone (DESYREL) 100 MG tablet Take 100 mg by mouth at bedtime.        Marland Kitchen tiotropium (SPIRIVA) 18 MCG inhalation capsule Place 18 mcg into inhaler and inhale every morning.          Allergies  Allergen Reactions  . Morphine Sulfate     REACTION: internal hemorrage    History   Social History  . Marital Status: Married    Spouse Name: N/A    Number of Children: 4  . Years of Education: N/A   Occupational History  . RETIRED    Social History Main Topics  . Smoking status: Current Everyday Smoker -- 0.3 packs/day for 45 years    Types: Cigarettes  . Smokeless tobacco: Never Used   Comment: He remains unwilling to quit  . Alcohol Use: No  . Drug Use: No  . Sexually Active: Not on file   Other Topics Concern  . Not on file    Social History Narrative  . No narrative on file    No family history on file.  ROS-  All systems are reviewed and are negative except as outlined in the HPI above  Physical Exam: Filed Vitals:   02/17/11 0941  BP: 103/64  Pulse: 70  Height: 5\' 7"  (1.702 m)  Weight: 143 lb (64.864 kg)    GEN- The patient is well appearing, alert and oriented x 3 today.   Head- normocephalic, atraumatic Eyes-  Sclera clear, conjunctiva pink Ears- hearing intact Oropharynx- clear Neck- supple, no JVP Lymph- no cervical lymphadenopathy Lungs- Clear to ausculation bilaterally, normal work of breathing Chest- ICD pocket is well healed Heart- Regular rate and rhythm, no murmurs, rubs or gallops, PMI not laterally displaced GI- soft, NT, ND, + BS Extremities- no clubbing, cyanosis, or edema  ICD interrogation- reviewed in detail today,  See PACEART report  Assessment and Plan:

## 2011-04-10 ENCOUNTER — Telehealth: Payer: Self-pay | Admitting: *Deleted

## 2011-04-10 NOTE — Telephone Encounter (Signed)
Message copied by Arlyss Gandy on Thu Apr 10, 2011  1:57 PM ------      Message from: Myrtis Ser, [place] D      Created: Thu Apr 10, 2011  1:19 PM       Please call the patient to see how he is doing in general. When he was seen in the device clinic there was question that his fluid status might be increased. I would like to see him back in the office over the next several weeks when convenient.

## 2011-04-10 NOTE — Telephone Encounter (Signed)
Left message with male at residence to call.  

## 2011-04-17 NOTE — Telephone Encounter (Signed)
Pt's wife states he is asleep but is doing well. She is aware someone will be contacting them to reschedule f/u appt w/Dr. Myrtis Ser.

## 2011-05-22 ENCOUNTER — Ambulatory Visit (INDEPENDENT_AMBULATORY_CARE_PROVIDER_SITE_OTHER): Payer: Medicare Other | Admitting: *Deleted

## 2011-05-22 ENCOUNTER — Encounter: Payer: Self-pay | Admitting: Internal Medicine

## 2011-05-22 DIAGNOSIS — I428 Other cardiomyopathies: Secondary | ICD-10-CM

## 2011-05-22 DIAGNOSIS — I5022 Chronic systolic (congestive) heart failure: Secondary | ICD-10-CM

## 2011-05-23 ENCOUNTER — Ambulatory Visit (INDEPENDENT_AMBULATORY_CARE_PROVIDER_SITE_OTHER): Payer: Medicare Other | Admitting: Cardiology

## 2011-05-23 ENCOUNTER — Encounter: Payer: Self-pay | Admitting: Cardiology

## 2011-05-23 VITALS — BP 139/90 | HR 76 | Ht 67.0 in | Wt 153.0 lb

## 2011-05-23 DIAGNOSIS — I059 Rheumatic mitral valve disease, unspecified: Secondary | ICD-10-CM

## 2011-05-23 DIAGNOSIS — I5022 Chronic systolic (congestive) heart failure: Secondary | ICD-10-CM

## 2011-05-23 DIAGNOSIS — I428 Other cardiomyopathies: Secondary | ICD-10-CM

## 2011-05-23 DIAGNOSIS — I340 Nonrheumatic mitral (valve) insufficiency: Secondary | ICD-10-CM

## 2011-05-23 DIAGNOSIS — I2510 Atherosclerotic heart disease of native coronary artery without angina pectoris: Secondary | ICD-10-CM | POA: Diagnosis not present

## 2011-05-23 DIAGNOSIS — F17200 Nicotine dependence, unspecified, uncomplicated: Secondary | ICD-10-CM

## 2011-05-23 LAB — REMOTE ICD DEVICE
AL AMPLITUDE: 2.3 mv
AL IMPEDENCE ICD: 408 Ohm
BAMS-0001: 170 {beats}/min
BRDY-0003LV: 130 {beats}/min
BRDY-0004LV: 120 {beats}/min
CHARGE TIME: 12.271 s
LV LEAD IMPEDENCE ICD: 456 Ohm
TOT-0001: 1
TOT-0006: 20081014000000
TZAT-0001ATACH: 1
TZAT-0001ATACH: 3
TZAT-0001SLOWVT: 2
TZAT-0002ATACH: NEGATIVE
TZAT-0004FASTVT: 8
TZAT-0004SLOWVT: 8
TZAT-0005SLOWVT: 88 pct
TZAT-0011FASTVT: 10 ms
TZAT-0011SLOWVT: 10 ms
TZAT-0012ATACH: 150 ms
TZAT-0012ATACH: 150 ms
TZAT-0012ATACH: 150 ms
TZAT-0012SLOWVT: 200 ms
TZAT-0012SLOWVT: 200 ms
TZAT-0013FASTVT: 1
TZAT-0013SLOWVT: 2
TZAT-0013SLOWVT: 2
TZAT-0018ATACH: NEGATIVE
TZAT-0018FASTVT: NEGATIVE
TZAT-0018SLOWVT: NEGATIVE
TZAT-0019ATACH: 6 V
TZAT-0019SLOWVT: 8 V
TZAT-0020ATACH: 1.5 ms
TZAT-0020ATACH: 1.5 ms
TZON-0004VSLOWVT: 20
TZST-0001ATACH: 6
TZST-0001FASTVT: 2
TZST-0001FASTVT: 5
TZST-0001FASTVT: 6
TZST-0001SLOWVT: 4
TZST-0001SLOWVT: 5
TZST-0002ATACH: NEGATIVE
TZST-0003FASTVT: 30 J
TZST-0003FASTVT: 35 J
TZST-0003FASTVT: 35 J
TZST-0003SLOWVT: 15 J
TZST-0003SLOWVT: 25 J
TZST-0003SLOWVT: 35 J
VENTRICULAR PACING ICD: 99.88 pct
VF: 0

## 2011-05-23 NOTE — Assessment & Plan Note (Signed)
I have again counseled him to stop smoking. I will see him back in one year.

## 2011-05-23 NOTE — Assessment & Plan Note (Signed)
He is clinically stable. All consider followup next year to reassess his valvular function.

## 2011-05-23 NOTE — Patient Instructions (Signed)
Continue all current medications. Your physician wants you to follow up in:  1 year.  You will receive a reminder letter in the mail one-two months in advance.  If you don't receive a letter, please call our office to schedule the follow up appointment   

## 2011-05-23 NOTE — Assessment & Plan Note (Signed)
He's actually well compensated. He mentioned to me that he gets up and takes his diuretic in the morning and it's a little concerning to him that he urinates frequently throughout the day. I explained to him that this was good since he was responding appropriately and not having signs of volume overload. He seems to understand.

## 2011-05-23 NOTE — Assessment & Plan Note (Signed)
He is on appropriate medications. No change in therapy. Consider followup echo next year.

## 2011-05-23 NOTE — Assessment & Plan Note (Signed)
Coronary disease is stable.  No further workup. 

## 2011-05-23 NOTE — Progress Notes (Signed)
HPI Paul [name] is seen today to followup cardiomyopathy. I saw Paul [name] last in June, 2012. He's actually doing well. He has a CRT D. Device. He saw Dr. Johney Frame in January, 2013. He's doing well. His ejection fraction had improved in July, 2009. He's done very well since then. He is on appropriate medications.  Allergies  Allergen Reactions  . Morphine Sulfate     REACTION: internal hemorrage    Current Outpatient Prescriptions  Medication Sig Dispense Refill  . aspirin 325 MG tablet Take 325 mg by mouth daily.        . butalbital-acetaminophen-caffeine (FIORICET WITH CODEINE) 50-325-40-30 MG per capsule Take 1 capsule by mouth every 4 (four) hours as needed.        . carvedilol (COREG) 12.5 MG tablet Take 12.5 mg by mouth 2 (two) times daily with a meal.        . divalproex (DEPAKOTE) 500 MG 24 hr tablet Take 1,500 mg by mouth at bedtime.        . finasteride (PROSCAR) 5 MG tablet Take 5 mg by mouth daily.        . folic acid (FOLVITE) 1 MG tablet Take 1 mg by mouth daily.        . furosemide (LASIX) 40 MG tablet Take 40 mg by mouth daily.        . hydroxypropyl methylcellulose (ISOPTO TEARS) 2.5 % ophthalmic solution 1 drop 4 (four) times daily as needed.       Paul [name] lisinopril (PRINIVIL,ZESTRIL) 40 MG tablet Take 20 mg by mouth daily.        . Multiple Vitamin (MULTIVITAMIN) tablet Take 1 tablet by mouth daily.        . nitroGLYCERIN (NITROSTAT) 0.4 MG SL tablet Place 0.4 mg under the tongue every 5 (five) minutes as needed.        Paul [name] omeprazole (PRILOSEC) 20 MG capsule Take 20 mg by mouth daily.        . potassium chloride SA (K-DUR,KLOR-CON) 20 MEQ tablet Take 20 mEq by mouth daily.        . rosuvastatin (CRESTOR) 40 MG tablet Take 40 mg by mouth daily.        Paul [name] tiotropium (SPIRIVA) 18 MCG inhalation capsule Place 18 mcg into inhaler and inhale every morning.        . traMADol (ULTRAM) 50 MG tablet Take 50 mg by mouth. Take 1 to 2 tablets by mouth every 8 to 12 hours as needed for pain       .  traZODone (DESYREL) 100 MG tablet Take 100 mg by mouth at bedtime.          History   Social History  . Marital Status: Married    Spouse Name: N/A    Number of Children: 4  . Years of Education: N/A   Occupational History  . RETIRED    Social History Main Topics  . Smoking status: Current Everyday Smoker -- 0.5 packs/day for 45 years    Types: Cigarettes  . Smokeless tobacco: Never Used   Comment: He remains unwilling to quit  . Alcohol Use: No  . Drug Use: No  . Sexually Active: Not on file   Other Topics Concern  . Not on file   Social History Narrative  . No narrative on file    No family history on file.  Past Medical History  Diagnosis Date  . Cardiomyopathy   . ICD (implantable cardiac defibrillator) battery depletion  CRT-D device  . Ejection fraction     Ejection fraction 25%  /  repeat ejection fraction 50%, echo, July, 2009  . Mitral regurgitation     Severe before CRT with annular dilatation and poor coaptation of the mitral leaflets  /  mild to moderate mitral regurgitation, July, 2009, after CRT therapy  . Pulmonary hypertension     44 mmHg, echo, July, 2009  . COPD (chronic obstructive pulmonary disease)   . Smoking     Chantix cause headaches  . Lung nodule     [facility]  . Hypertension   . Dyslipidemia   . BPH (benign prostatic hyperplasia)   . Gout   . Alcohol abuse     In the past.  Stable over many years  . Migraines   . LBBB (left bundle branch block)   . CAD (coronary artery disease)     Catheterization April, 2008, single vessel disease with collateralized branch of the left circumflex  . Syncope     Probably orthostasis    Past Surgical History  Procedure Date  . Cardiac defibrillator placement     BiV ICD implanted    ROS    Paul [name] denies fever, chills, headache, sweats, rash, change in vision, change in hearing, chest pain, cough, nausea vomiting, urinary symptoms. All other systems are reviewed and are  negative.  PHYSICAL EXAM  Paul [name] is oriented to person time and place. Affect is normal. He smells of cigarette smoke. Lungs reveal decreased breath sounds. Cardiac exam reveals S1 and S2. There no clicks or significant murmurs. Abdomen is soft. Is no peripheral edema. He has multiple lipomas. These are scattered on his arms. They are benign. There no skin rashes.  Filed Vitals:   05/23/11 1326  BP: 139/90  Pulse: 76  Height: 5\' 7"  (1.702 m)  Weight: 153 lb (69.4 kg)    EKG    EKG is done today and reviewed by me. The rhythm is paced.  ASSESSMENT & PLAN

## 2011-06-02 NOTE — Progress Notes (Signed)
Remote icd check w/icm  

## 2011-06-06 ENCOUNTER — Encounter: Payer: Self-pay | Admitting: *Deleted

## 2011-07-23 ENCOUNTER — Ambulatory Visit: Payer: Medicare Other | Admitting: Cardiology

## 2011-08-28 ENCOUNTER — Ambulatory Visit (INDEPENDENT_AMBULATORY_CARE_PROVIDER_SITE_OTHER): Payer: Medicare Other | Admitting: *Deleted

## 2011-08-28 ENCOUNTER — Encounter: Payer: Self-pay | Admitting: Internal Medicine

## 2011-08-28 DIAGNOSIS — I5022 Chronic systolic (congestive) heart failure: Secondary | ICD-10-CM

## 2011-08-28 DIAGNOSIS — I428 Other cardiomyopathies: Secondary | ICD-10-CM

## 2011-09-01 LAB — REMOTE ICD DEVICE
AL AMPLITUDE: 2.1296 mv
AL IMPEDENCE ICD: 408 Ohm
ATRIAL PACING ICD: 0.06 pct
BAMS-0001: 170 {beats}/min
BATTERY VOLTAGE: 2.66 V
CHARGE TIME: 12.271 s
RV LEAD IMPEDENCE ICD: 512 Ohm
TOT-0002: 0
TZAT-0001SLOWVT: 2
TZAT-0002ATACH: NEGATIVE
TZAT-0002ATACH: NEGATIVE
TZAT-0004SLOWVT: 8
TZAT-0005FASTVT: 88 pct
TZAT-0011SLOWVT: 10 ms
TZAT-0011SLOWVT: 10 ms
TZAT-0012ATACH: 150 ms
TZAT-0012FASTVT: 200 ms
TZAT-0012SLOWVT: 200 ms
TZAT-0018ATACH: NEGATIVE
TZAT-0018FASTVT: NEGATIVE
TZAT-0019FASTVT: 8 V
TZAT-0020ATACH: 1.5 ms
TZAT-0020ATACH: 1.5 ms
TZAT-0020SLOWVT: 1.5 ms
TZAT-0020SLOWVT: 1.5 ms
TZON-0003ATACH: 350 ms
TZON-0003SLOWVT: 340 ms
TZON-0004SLOWVT: 16
TZON-0005SLOWVT: 12
TZST-0001ATACH: 4
TZST-0001ATACH: 5
TZST-0001ATACH: 6
TZST-0001FASTVT: 2
TZST-0001FASTVT: 3
TZST-0001SLOWVT: 4
TZST-0001SLOWVT: 5
TZST-0001SLOWVT: 6
TZST-0002ATACH: NEGATIVE
TZST-0002ATACH: NEGATIVE
TZST-0003FASTVT: 35 J
TZST-0003SLOWVT: 25 J
TZST-0003SLOWVT: 35 J
TZST-0003SLOWVT: 35 J
VF: 0

## 2011-09-08 ENCOUNTER — Encounter: Payer: Self-pay | Admitting: *Deleted

## 2011-09-17 ENCOUNTER — Telehealth: Payer: Self-pay | Admitting: Cardiology

## 2011-09-17 NOTE — Telephone Encounter (Signed)
Spoke w/pt. Pt had received letter in regards to transmission.

## 2011-09-17 NOTE — Telephone Encounter (Signed)
Please return call to patient [phone] regarding questions on 7/26 remote device transmission.

## 2011-10-26 DIAGNOSIS — Z23 Encounter for immunization: Secondary | ICD-10-CM | POA: Diagnosis not present

## 2011-12-01 ENCOUNTER — Telehealth: Payer: Self-pay | Admitting: Internal Medicine

## 2011-12-01 ENCOUNTER — Encounter: Payer: Self-pay | Admitting: Internal Medicine

## 2011-12-01 ENCOUNTER — Ambulatory Visit (INDEPENDENT_AMBULATORY_CARE_PROVIDER_SITE_OTHER): Payer: Medicare Other | Admitting: *Deleted

## 2011-12-01 DIAGNOSIS — I5022 Chronic systolic (congestive) heart failure: Secondary | ICD-10-CM

## 2011-12-01 DIAGNOSIS — I428 Other cardiomyopathies: Secondary | ICD-10-CM

## 2011-12-01 NOTE — Telephone Encounter (Signed)
Transmission received, patient aware. 

## 2011-12-01 NOTE — Telephone Encounter (Signed)
plz return call to patient [phone] regarding remote transmission.

## 2011-12-08 LAB — REMOTE ICD DEVICE
BRDY-0003LV: 130 {beats}/min
BRDY-0004LV: 120 {beats}/min
CHARGE TIME: 13.302 s
FVT: 0
LV LEAD THRESHOLD: 0.5 V
PACEART VT: 0
TOT-0001: 1
TZAT-0001FASTVT: 1
TZAT-0001SLOWVT: 1
TZAT-0001SLOWVT: 2
TZAT-0002ATACH: NEGATIVE
TZAT-0004SLOWVT: 8
TZAT-0004SLOWVT: 8
TZAT-0012ATACH: 150 ms
TZAT-0012ATACH: 150 ms
TZAT-0013SLOWVT: 2
TZAT-0013SLOWVT: 2
TZAT-0018ATACH: NEGATIVE
TZAT-0018FASTVT: NEGATIVE
TZAT-0018SLOWVT: NEGATIVE
TZAT-0019ATACH: 6 V
TZAT-0019ATACH: 6 V
TZAT-0019ATACH: 6 V
TZAT-0019FASTVT: 8 V
TZAT-0019SLOWVT: 8 V
TZAT-0020ATACH: 1.5 ms
TZAT-0020SLOWVT: 1.5 ms
TZAT-0020SLOWVT: 1.5 ms
TZON-0003FASTVT: 240 ms
TZON-0003VSLOWVT: 400 ms
TZON-0004SLOWVT: 16
TZON-0004VSLOWVT: 20
TZST-0001ATACH: 4
TZST-0001ATACH: 5
TZST-0001FASTVT: 2
TZST-0001FASTVT: 6
TZST-0001SLOWVT: 3
TZST-0001SLOWVT: 5
TZST-0003FASTVT: 35 J
TZST-0003FASTVT: 35 J
TZST-0003SLOWVT: 25 J
VENTRICULAR PACING ICD: 99.55 pct

## 2011-12-19 ENCOUNTER — Encounter: Payer: Self-pay | Admitting: Internal Medicine

## 2011-12-19 ENCOUNTER — Encounter: Payer: Self-pay | Admitting: *Deleted

## 2011-12-19 ENCOUNTER — Ambulatory Visit (INDEPENDENT_AMBULATORY_CARE_PROVIDER_SITE_OTHER): Payer: Medicare Other | Admitting: Internal Medicine

## 2011-12-19 ENCOUNTER — Telehealth: Payer: Self-pay | Admitting: Internal Medicine

## 2011-12-19 VITALS — BP 120/90 | HR 50 | Ht 67.0 in | Wt 151.4 lb

## 2011-12-19 DIAGNOSIS — Z95810 Presence of automatic (implantable) cardiac defibrillator: Secondary | ICD-10-CM | POA: Diagnosis not present

## 2011-12-19 DIAGNOSIS — Z4502 Encounter for adjustment and management of automatic implantable cardiac defibrillator: Secondary | ICD-10-CM

## 2011-12-19 DIAGNOSIS — F17200 Nicotine dependence, unspecified, uncomplicated: Secondary | ICD-10-CM

## 2011-12-19 DIAGNOSIS — I509 Heart failure, unspecified: Secondary | ICD-10-CM | POA: Diagnosis not present

## 2011-12-19 DIAGNOSIS — I5022 Chronic systolic (congestive) heart failure: Secondary | ICD-10-CM

## 2011-12-19 DIAGNOSIS — Z01810 Encounter for preprocedural cardiovascular examination: Secondary | ICD-10-CM

## 2011-12-19 DIAGNOSIS — Z01811 Encounter for preprocedural respiratory examination: Secondary | ICD-10-CM | POA: Diagnosis not present

## 2011-12-19 LAB — PROTIME-INR

## 2011-12-19 NOTE — Assessment & Plan Note (Signed)
As above.

## 2011-12-19 NOTE — Telephone Encounter (Signed)
Medicare only.  No precert required °

## 2011-12-19 NOTE — Assessment & Plan Note (Signed)
euvolemic on exam Normal BiV ICD function though he has reached ERI battery status See Arita Miss Art report No changes today   Risks, benefits, and alternatives to BiV ICD pulse generator replacement were discussed at length today.  The patient  understands that the risks include but are not limited to bleeding, infection, pneumothorax, perforation, tamponade, vascular damage, renal failure, MI, stroke, death, inappropriate shocks, and lead dislodgement and wishes to proceed.  We will therefore schedule device replacement at the next available time.

## 2011-12-19 NOTE — Patient Instructions (Signed)
   Generator change out - see info given Continue all current medications. Follow up will be given after procedure above

## 2011-12-19 NOTE — Assessment & Plan Note (Signed)
Cessation advised He is not ready to quit 

## 2011-12-19 NOTE — Progress Notes (Signed)
PCP:  PIVA,ENRICO E, DO Primary Cardiologist:  Katz  The patient presents today for electrophysiology followup. Recent carelink transmission revealed ERI battery status reached by his BiV ICD. Since last being seen in our clinic, the patient reports doing very well.  Today, he denies symptoms of palpitations, chest pain, shortness of breath, orthopnea, PND, lower extremity edema, presyncope, syncope.  He has occasional orthostatic dizziness.  The patient feels that he is tolerating medications without difficulties and is otherwise without complaint today.   Unfortunately, he continues to smoke and is not ready to quit.  He does use an electronic cigarette at times.  Past Medical History  Diagnosis Date  . Cardiomyopathy   . ICD (implantable cardiac defibrillator) battery depletion     CRT-D device  . Ejection fraction     Ejection fraction 25%  /  repeat ejection fraction 50%, echo, July, 2009  . Mitral regurgitation     Severe before CRT with annular dilatation and poor coaptation of the mitral leaflets  /  mild to moderate mitral regurgitation, July, 2009, after CRT therapy  . Pulmonary hypertension     44 mmHg, echo, July, 2009  . COPD (chronic obstructive pulmonary disease)   . Smoking     Chantix cause headaches  . Lung nodule     [facility]  . Hypertension   . Dyslipidemia   . BPH (benign prostatic hyperplasia)   . Gout   . Alcohol abuse     In the past.  Stable over many years  . Migraines   . LBBB (left bundle branch block)   . CAD (coronary artery disease)     Catheterization April, 2008, single vessel disease with collateralized branch of the left circumflex  . Syncope     Probably orthostasis   Past Surgical History  Procedure Date  . Cardiac defibrillator placement     BiV ICD implanted    Current Outpatient Prescriptions  Medication Sig Dispense Refill  . aspirin 325 MG tablet Take 325 mg by mouth daily.        . atorvastatin (LIPITOR) 80 MG tablet Take  80 mg by mouth daily.      . butalbital-acetaminophen-caffeine (FIORICET WITH CODEINE) 50-325-40-30 MG per capsule Take 1 capsule by mouth every 4 (four) hours as needed.        . carvedilol (COREG) 12.5 MG tablet Take 12.5 mg by mouth 2 (two) times daily with a meal.        . divalproex (DEPAKOTE) 500 MG 24 hr tablet Take 1,500 mg by mouth at bedtime.        . finasteride (PROSCAR) 5 MG tablet Take 5 mg by mouth daily.        . folic acid (FOLVITE) 1 MG tablet Take 1 mg by mouth daily.        . furosemide (LASIX) 40 MG tablet Take 40 mg by mouth daily.        . hydroxypropyl methylcellulose (ISOPTO TEARS) 2.5 % ophthalmic solution 1 drop 4 (four) times daily as needed.       . lisinopril (PRINIVIL,ZESTRIL) 40 MG tablet Take 20 mg by mouth daily.        . Multiple Vitamin (MULTIVITAMIN) tablet Take 1 tablet by mouth daily.        . nitroGLYCERIN (NITROSTAT) 0.4 MG SL tablet Place 0.4 mg under the tongue every 5 (five) minutes as needed.        . omeprazole (PRILOSEC) 20 MG capsule   Take 20 mg by mouth daily.        . potassium chloride SA (K-DUR,KLOR-CON) 20 MEQ tablet Take 20 mEq by mouth daily.        . tiotropium (SPIRIVA) 18 MCG inhalation capsule Place 18 mcg into inhaler and inhale every morning.        . traMADol (ULTRAM) 50 MG tablet Take 50 mg by mouth. Take 1 to 2 tablets by mouth every 8 to 12 hours as needed for pain       . traZODone (DESYREL) 100 MG tablet Take 100 mg by mouth at bedtime.          Allergies  Allergen Reactions  . Morphine Sulfate     REACTION: internal hemorrage    History   Social History  . Marital Status: Married    Spouse Name: N/A    Number of Children: 4  . Years of Education: N/A   Occupational History  . RETIRED    Social History Main Topics  . Smoking status: Current Every Day Smoker -- 0.5 packs/day for 45 years    Types: Cigarettes  . Smokeless tobacco: Never Used     Comment: He remains unwilling to quit  . Alcohol Use: No  . Drug  Use: No  . Sexually Active: Not on file   Other Topics Concern  . Not on file   Social History Narrative  . No narrative on file    Physical Exam: Filed Vitals:   12/19/11 1405  BP: 120/90  Pulse: 50  Height: 5' 7" (1.702 m)  Weight: 151 lb 6.4 oz (68.675 kg)  SpO2: 91%    GEN- The patient is well appearing, alert and oriented x 3 today.   Head- normocephalic, atraumatic Eyes-  Sclera clear, conjunctiva pink Ears- hearing intact Oropharynx- clear Neck- supple, no JVP Lymph- no cervical lymphadenopathy Lungs- Clear to ausculation bilaterally, normal work of breathing Chest- ICD pocket is well healed Heart- Regular rate and rhythm, no murmurs, rubs or gallops, PMI not laterally displaced GI- soft, NT, ND, + BS Extremities- no clubbing, cyanosis, or edema  ICD interrogation- reviewed in detail today,  See PACEART report  Assessment and Plan:  

## 2011-12-19 NOTE — Telephone Encounter (Signed)
Generator change out scheduled for 11/26 - 3:00 - Dr. Johney Frame - [facility]

## 2011-12-22 ENCOUNTER — Encounter (HOSPITAL_COMMUNITY): Payer: Self-pay | Admitting: Pharmacy Technician

## 2011-12-29 DIAGNOSIS — F17200 Nicotine dependence, unspecified, uncomplicated: Secondary | ICD-10-CM | POA: Diagnosis not present

## 2011-12-29 DIAGNOSIS — I442 Atrioventricular block, complete: Secondary | ICD-10-CM | POA: Diagnosis not present

## 2011-12-29 DIAGNOSIS — I059 Rheumatic mitral valve disease, unspecified: Secondary | ICD-10-CM | POA: Diagnosis not present

## 2011-12-29 DIAGNOSIS — I428 Other cardiomyopathies: Secondary | ICD-10-CM | POA: Diagnosis not present

## 2011-12-29 DIAGNOSIS — E785 Hyperlipidemia, unspecified: Secondary | ICD-10-CM | POA: Diagnosis not present

## 2011-12-29 DIAGNOSIS — Z4502 Encounter for adjustment and management of automatic implantable cardiac defibrillator: Secondary | ICD-10-CM | POA: Diagnosis not present

## 2011-12-29 DIAGNOSIS — I2789 Other specified pulmonary heart diseases: Secondary | ICD-10-CM | POA: Diagnosis not present

## 2011-12-29 DIAGNOSIS — N400 Enlarged prostate without lower urinary tract symptoms: Secondary | ICD-10-CM | POA: Diagnosis not present

## 2011-12-29 DIAGNOSIS — Z79899 Other long term (current) drug therapy: Secondary | ICD-10-CM | POA: Diagnosis not present

## 2011-12-29 DIAGNOSIS — I10 Essential (primary) hypertension: Secondary | ICD-10-CM | POA: Diagnosis not present

## 2011-12-29 DIAGNOSIS — I509 Heart failure, unspecified: Secondary | ICD-10-CM | POA: Diagnosis not present

## 2011-12-29 DIAGNOSIS — I2510 Atherosclerotic heart disease of native coronary artery without angina pectoris: Secondary | ICD-10-CM | POA: Diagnosis not present

## 2011-12-29 DIAGNOSIS — M109 Gout, unspecified: Secondary | ICD-10-CM | POA: Diagnosis not present

## 2011-12-29 MED ORDER — CHLORHEXIDINE GLUCONATE 4 % EX LIQD
60.0000 mL | Freq: Once | CUTANEOUS | Status: DC
  Filled 2011-12-29: qty 60

## 2011-12-29 MED ORDER — SODIUM CHLORIDE 0.9 % IR SOLN
80.0000 mg | Status: AC
  Filled 2011-12-29: qty 2

## 2011-12-29 MED ORDER — CEFAZOLIN SODIUM-DEXTROSE 2-3 GM-% IV SOLR
2.0000 g | INTRAVENOUS | Status: AC
  Filled 2011-12-29: qty 50

## 2011-12-30 ENCOUNTER — Ambulatory Visit (HOSPITAL_COMMUNITY): Payer: Medicare Other

## 2011-12-30 ENCOUNTER — Ambulatory Visit (HOSPITAL_COMMUNITY)
Admission: RE | Admit: 2011-12-30 | Discharge: 2011-12-30 | Disposition: A | Discharge status: Home / Self Care | Payer: Medicare Other | Source: Ambulatory Visit | Attending: Internal Medicine | Admitting: Internal Medicine

## 2011-12-30 ENCOUNTER — Encounter (HOSPITAL_COMMUNITY)
Admission: RE | Disposition: A | Discharge status: Home / Self Care | Payer: Self-pay | Source: Ambulatory Visit | Attending: Internal Medicine

## 2011-12-30 DIAGNOSIS — I509 Heart failure, unspecified: Secondary | ICD-10-CM | POA: Insufficient documentation

## 2011-12-30 DIAGNOSIS — Z4502 Encounter for adjustment and management of automatic implantable cardiac defibrillator: Secondary | ICD-10-CM

## 2011-12-30 DIAGNOSIS — I10 Essential (primary) hypertension: Secondary | ICD-10-CM | POA: Insufficient documentation

## 2011-12-30 DIAGNOSIS — I428 Other cardiomyopathies: Secondary | ICD-10-CM | POA: Insufficient documentation

## 2011-12-30 DIAGNOSIS — I447 Left bundle-branch block, unspecified: Secondary | ICD-10-CM | POA: Diagnosis present

## 2011-12-30 DIAGNOSIS — E785 Hyperlipidemia, unspecified: Secondary | ICD-10-CM | POA: Insufficient documentation

## 2011-12-30 DIAGNOSIS — I2510 Atherosclerotic heart disease of native coronary artery without angina pectoris: Secondary | ICD-10-CM | POA: Diagnosis present

## 2011-12-30 DIAGNOSIS — I059 Rheumatic mitral valve disease, unspecified: Secondary | ICD-10-CM | POA: Insufficient documentation

## 2011-12-30 DIAGNOSIS — M109 Gout, unspecified: Secondary | ICD-10-CM | POA: Insufficient documentation

## 2011-12-30 DIAGNOSIS — F17200 Nicotine dependence, unspecified, uncomplicated: Secondary | ICD-10-CM | POA: Insufficient documentation

## 2011-12-30 DIAGNOSIS — I5022 Chronic systolic (congestive) heart failure: Secondary | ICD-10-CM | POA: Diagnosis present

## 2011-12-30 DIAGNOSIS — R55 Syncope and collapse: Secondary | ICD-10-CM | POA: Diagnosis present

## 2011-12-30 DIAGNOSIS — N400 Enlarged prostate without lower urinary tract symptoms: Secondary | ICD-10-CM | POA: Insufficient documentation

## 2011-12-30 DIAGNOSIS — Z79899 Other long term (current) drug therapy: Secondary | ICD-10-CM | POA: Insufficient documentation

## 2011-12-30 DIAGNOSIS — I442 Atrioventricular block, complete: Secondary | ICD-10-CM | POA: Insufficient documentation

## 2011-12-30 DIAGNOSIS — I2789 Other specified pulmonary heart diseases: Secondary | ICD-10-CM | POA: Insufficient documentation

## 2011-12-30 HISTORY — PX: BI-VENTRICULAR IMPLANTABLE CARDIOVERTER DEFIBRILLATOR: SHX5459

## 2011-12-30 LAB — SURGICAL PCR SCREEN
MRSA, PCR: NEGATIVE
Staphylococcus aureus: NEGATIVE

## 2011-12-30 SURGERY — BI-VENTRICULAR IMPLANTABLE CARDIOVERTER DEFIBRILLATOR  (CRT-D)
Anesthesia: LOCAL

## 2011-12-30 MED ORDER — LIDOCAINE HCL (PF) 1 % IJ SOLN
INTRAMUSCULAR | Status: AC
  Filled 2011-12-30: qty 60

## 2011-12-30 MED ORDER — SODIUM CHLORIDE 0.9 % IV SOLN: 250.0000 mL | INTRAVENOUS | Status: DC | PRN

## 2011-12-30 MED ORDER — ONDANSETRON HCL 4 MG/2ML IJ SOLN: 4.0000 mg | Freq: Four times a day (QID) | INTRAMUSCULAR | Status: DC | PRN

## 2011-12-30 MED ORDER — SODIUM CHLORIDE 0.45 % IV SOLN
INTRAVENOUS | Status: DC
  Administered 2011-12-30: 13:00:00 via INTRAVENOUS

## 2011-12-30 MED ORDER — ACETAMINOPHEN 325 MG PO TABS: 325.0000 mg | ORAL_TABLET | ORAL | Status: DC | PRN

## 2011-12-30 MED ORDER — LIDOCAINE HCL (PF) 1 % IJ SOLN
INTRAMUSCULAR | Status: AC
  Filled 2011-12-30: qty 30

## 2011-12-30 MED ORDER — MUPIROCIN 2 % EX OINT
TOPICAL_OINTMENT | CUTANEOUS | Status: AC
  Administered 2011-12-30: 1 via NASAL
  Filled 2011-12-30: qty 22

## 2011-12-30 MED ORDER — HYDROCODONE-ACETAMINOPHEN 5-325 MG PO TABS: 1.0000 | ORAL_TABLET | ORAL | Status: DC | PRN

## 2011-12-30 MED ORDER — SODIUM CHLORIDE 0.9 % IJ SOLN: 3.0000 mL | INTRAMUSCULAR | Status: DC | PRN

## 2011-12-30 MED ORDER — SODIUM CHLORIDE 0.9 % IJ SOLN: 3.0000 mL | Freq: Two times a day (BID) | INTRAMUSCULAR | Status: DC

## 2011-12-30 MED ORDER — MUPIROCIN 2 % EX OINT
TOPICAL_OINTMENT | Freq: Two times a day (BID) | CUTANEOUS | Status: DC
  Administered 2011-12-30: 1 via NASAL
  Filled 2011-12-30: qty 22

## 2011-12-30 NOTE — H&P (View-Only) (Signed)
PCP:  Raye Sorrow, DO Primary Cardiologist:  Myrtis Ser  The patient presents today for electrophysiology followup. Recent carelink transmission revealed ERI battery status reached by his BiV ICD. Since last being seen in our clinic, the patient reports doing very well.  Today, he denies symptoms of palpitations, chest pain, shortness of breath, orthopnea, PND, lower extremity edema, presyncope, syncope.  He has occasional orthostatic dizziness.  The patient feels that he is tolerating medications without difficulties and is otherwise without complaint today.   Unfortunately, he continues to smoke and is not ready to quit.  He does use an electronic cigarette at times.  Past Medical History  Diagnosis Date  . Cardiomyopathy   . ICD (implantable cardiac defibrillator) battery depletion     CRT-D device  . Ejection fraction     Ejection fraction 25%  /  repeat ejection fraction 50%, echo, July, 2009  . Mitral regurgitation     Severe before CRT with annular dilatation and poor coaptation of the mitral leaflets  /  mild to moderate mitral regurgitation, July, 2009, after CRT therapy  . Pulmonary hypertension     44 mmHg, echo, July, 2009  . COPD (chronic obstructive pulmonary disease)   . Smoking     Chantix cause headaches  . Lung nodule     [facility]  . Hypertension   . Dyslipidemia   . BPH (benign prostatic hyperplasia)   . Gout   . Alcohol abuse     In the past.  Stable over many years  . Migraines   . LBBB (left bundle branch block)   . CAD (coronary artery disease)     Catheterization April, 2008, single vessel disease with collateralized branch of the left circumflex  . Syncope     Probably orthostasis   Past Surgical History  Procedure Date  . Cardiac defibrillator placement     BiV ICD implanted    Current Outpatient Prescriptions  Medication Sig Dispense Refill  . aspirin 325 MG tablet Take 325 mg by mouth daily.        Marland Kitchen atorvastatin (LIPITOR) 80 MG tablet Take  80 mg by mouth daily.      . butalbital-acetaminophen-caffeine (FIORICET WITH CODEINE) 50-325-40-30 MG per capsule Take 1 capsule by mouth every 4 (four) hours as needed.        . carvedilol (COREG) 12.5 MG tablet Take 12.5 mg by mouth 2 (two) times daily with a meal.        . divalproex (DEPAKOTE) 500 MG 24 hr tablet Take 1,500 mg by mouth at bedtime.        . finasteride (PROSCAR) 5 MG tablet Take 5 mg by mouth daily.        . folic acid (FOLVITE) 1 MG tablet Take 1 mg by mouth daily.        . furosemide (LASIX) 40 MG tablet Take 40 mg by mouth daily.        . hydroxypropyl methylcellulose (ISOPTO TEARS) 2.5 % ophthalmic solution 1 drop 4 (four) times daily as needed.       Marland Kitchen lisinopril (PRINIVIL,ZESTRIL) 40 MG tablet Take 20 mg by mouth daily.        . Multiple Vitamin (MULTIVITAMIN) tablet Take 1 tablet by mouth daily.        . nitroGLYCERIN (NITROSTAT) 0.4 MG SL tablet Place 0.4 mg under the tongue every 5 (five) minutes as needed.        Marland Kitchen omeprazole (PRILOSEC) 20 MG capsule  Take 20 mg by mouth daily.        . potassium chloride SA (K-DUR,KLOR-CON) 20 MEQ tablet Take 20 mEq by mouth daily.        Marland Kitchen tiotropium (SPIRIVA) 18 MCG inhalation capsule Place 18 mcg into inhaler and inhale every morning.        . traMADol (ULTRAM) 50 MG tablet Take 50 mg by mouth. Take 1 to 2 tablets by mouth every 8 to 12 hours as needed for pain       . traZODone (DESYREL) 100 MG tablet Take 100 mg by mouth at bedtime.          Allergies  Allergen Reactions  . Morphine Sulfate     REACTION: internal hemorrage    History   Social History  . Marital Status: Married    Spouse Name: N/A    Number of Children: 4  . Years of Education: N/A   Occupational History  . RETIRED    Social History Main Topics  . Smoking status: Current Every Day Smoker -- 0.5 packs/day for 45 years    Types: Cigarettes  . Smokeless tobacco: Never Used     Comment: He remains unwilling to quit  . Alcohol Use: No  . Drug  Use: No  . Sexually Active: Not on file   Other Topics Concern  . Not on file   Social History Narrative  . No narrative on file    Physical Exam: Filed Vitals:   12/19/11 1405  BP: 120/90  Pulse: 50  Height: 5\' 7"  (1.702 m)  Weight: 151 lb 6.4 oz (68.675 kg)  SpO2: 91%    GEN- The patient is well appearing, alert and oriented x 3 today.   Head- normocephalic, atraumatic Eyes-  Sclera clear, conjunctiva pink Ears- hearing intact Oropharynx- clear Neck- supple, no JVP Lymph- no cervical lymphadenopathy Lungs- Clear to ausculation bilaterally, normal work of breathing Chest- ICD pocket is well healed Heart- Regular rate and rhythm, no murmurs, rubs or gallops, PMI not laterally displaced GI- soft, NT, ND, + BS Extremities- no clubbing, cyanosis, or edema  ICD interrogation- reviewed in detail today,  See PACEART report  Assessment and Plan:

## 2011-12-30 NOTE — Op Note (Signed)
SURGEON:  Hillis Range, MD      PREPROCEDURE DIAGNOSES:   1. Mixed cardiomyopathy.   2. New York Heart Association class III, heart failure chronically.   3. CAD  4. Complete heart block  5. ICD at [facility]     POSTPROCEDURE DIAGNOSES:   1. Mixed cardiomyopathy.   2. New York Heart Association class III, heart failure chronically.   3. CAD  4. Complete heart block  5. ICD at [facility]   PROCEDURES:    1.  ICD pulse generator replacement  2. Skin pocket revision     INTRODUCTION:   Markanthony Locklar. is a 71 y.o. male with a mixed CM, NYHA Class III CHF, CAD, and complete heart block s/p BiV ICD implantation who presents today for pulse generator replacement for ERI battery status.      DESCRIPTION OF PROCEDURE:  Informed written consent was obtained and the patient was brought to the electrophysiology lab in the fasting state.  The ICD was interrogated and confirmed to be at [facility] battery status.  The patient required no sedation for the procedure today.  The patient's left chest was prepped and draped in the usual sterile fashion by the EP lab staff.  The skin overlying the left deltopectoral region was infiltrated with lidocaine for local analgesia.  A 5-cm incision was made over the existing ICD pocket.  Electrocautery was used to assure hemostasis.  The device was exposed and removed from the pocket. The device was disconnected from the leads.  The leads were examined thoroughly and their integrity confirmed to be intact. The right atrial lead was confirmed to be a Medtronic [id number]  (serial # [id number]) lead implanted 11/17/2006.  The right ventricular lead was confirmed to be a Medtronic model [id number] (serial number [id number]) right ventricular defibrillator lead also implanted 11/17/2006.  The LV lead was confirmed to be a Psychologist, occupational ([place] [id number]) lead implanted on the same date.  Atrial lead P-waves measured 2.3 mV with an impedance of 437 ohms and a threshold of 0.75 volts at 0.4  milliseconds.  The right ventricular lead R-wave measured 20 mV with impedance of 494 ohms and a threshold of 1.25 volts at 0.6 milliseconds.  The left ventricular lead impedance was 494 ohms with a threshold of 1.25 volts at 0.4 milliseconds.   The leads were then connected to a Medtronic Protecta XT model [id number] BiV ICD (SN [id number]).  The pocket was revised to accomodate this new device.  The pocket was  irrigated with copious gentamicin solution.  The defibrillator was placed into the  Pocket.  The pocket was then closed in 2 layers with 2.0 Vicryl suture  for the subcutaneous and subcuticular layers.  Steri-Strips and a  sterile dressing were then applied.  There were no early apparent complications.     CONCLUSIONS:   1. Mixed cardiomyopathy with chronic New York Heart Association class III heart failure, CAD, and complete heart block.   2. BiV ICD at elective replacement indicator  3. Successful BiV ICD pulse generator replacement   4. DFT testing not performed today  5. No early apparent complications.   Fayrene Fearing Mackensey Bolte,MD

## 2011-12-30 NOTE — Progress Notes (Signed)
1430 face red with no complaint of itch.  No medications have been given to patient po, im, or iv but he has had mupiricin applied to both nares.  Spoke with Epifania Gore, [facility] and he advised to wipe ointment off. Dr Johney Frame paged.

## 2011-12-30 NOTE — Interval H&P Note (Signed)
History and Physical Interval Note:  12/30/2011 2:33 PM  Paul [name].  has presented today for surgery, with the diagnosis of lv dysfunction  The various methods of treatment have been discussed with the patient and family. After consideration of risks, benefits and other options for treatment, the patient has consented to  Procedure(s) (LRB) with comments: BI-VENTRICULAR IMPLANTABLE CARDIOVERTER DEFIBRILLATOR  (CRT-D) (N/A) as a surgical intervention .  The patient's history has been reviewed, patient examined, no change in status, stable for surgery.  I have reviewed the patient's chart and labs.  Questions were answered to the patient's satisfaction.     Hillis Range

## 2012-01-21 ENCOUNTER — Encounter: Payer: Self-pay | Admitting: Internal Medicine

## 2012-01-21 ENCOUNTER — Ambulatory Visit (INDEPENDENT_AMBULATORY_CARE_PROVIDER_SITE_OTHER): Payer: Medicare Other | Admitting: *Deleted

## 2012-01-21 DIAGNOSIS — I428 Other cardiomyopathies: Secondary | ICD-10-CM | POA: Diagnosis not present

## 2012-01-21 LAB — ICD DEVICE OBSERVATION
AL IMPEDENCE ICD: 494 Ohm
BAMS-0001: 170 {beats}/min
CHARGE TIME: 8.558 s
LV LEAD IMPEDENCE ICD: 532 Ohm
PACEART VT: 0
TOT-0001: 0
TOT-0002: 0
TZAT-0001ATACH: 1
TZAT-0002ATACH: NEGATIVE
TZAT-0011SLOWVT: 10 ms
TZAT-0011SLOWVT: 10 ms
TZAT-0012FASTVT: 170 ms
TZAT-0012SLOWVT: 170 ms
TZAT-0012SLOWVT: 170 ms
TZAT-0013FASTVT: 1
TZAT-0013SLOWVT: 2
TZAT-0018ATACH: NEGATIVE
TZAT-0018ATACH: NEGATIVE
TZAT-0018FASTVT: NEGATIVE
TZAT-0018SLOWVT: NEGATIVE
TZAT-0018SLOWVT: NEGATIVE
TZAT-0019ATACH: 6 V
TZAT-0019ATACH: 6 V
TZAT-0019FASTVT: 8 V
TZAT-0019SLOWVT: 8 V
TZAT-0019SLOWVT: 8 V
TZAT-0020ATACH: 1.5 ms
TZAT-0020ATACH: 1.5 ms
TZAT-0020FASTVT: 1.5 ms
TZON-0003FASTVT: 240 ms
TZON-0003SLOWVT: 320 ms
TZON-0005SLOWVT: 12
TZST-0001ATACH: 4
TZST-0001ATACH: 5
TZST-0001FASTVT: 4
TZST-0001SLOWVT: 4
TZST-0002ATACH: NEGATIVE
TZST-0002ATACH: NEGATIVE
TZST-0003FASTVT: 35 J
TZST-0003FASTVT: 35 J
TZST-0003SLOWVT: 35 J
VENTRICULAR PACING ICD: 99.97 pct

## 2012-01-21 NOTE — Progress Notes (Signed)
Wound check defib in clinic  

## 2012-02-03 ENCOUNTER — Telehealth: Payer: Self-pay | Admitting: Internal Medicine

## 2012-02-03 NOTE — Telephone Encounter (Signed)
Patient received box to ship old machine back, but before he does he wanted to make sure that the new one has been ordered

## 2012-02-03 NOTE — Telephone Encounter (Signed)
Spoke w/pt in regards to transmitter. Transmitter ordered 01-21-12 and was shipped 01-26-12. Pt aware and will send old transmitter back.

## 2012-02-05 ENCOUNTER — Telehealth: Payer: Self-pay | Admitting: Internal Medicine

## 2012-02-05 NOTE — Telephone Encounter (Signed)
Pt called in regards to having 2 black dots above site. Per pt feels like wires poking out of skin. Pt has trouble with transportation. Scheduled pt for wound check on 02-09-12 @ 1030 in [place] office. Pt aware of appt/kwm

## 2012-02-05 NOTE — Telephone Encounter (Signed)
Called and asked that [place] call him

## 2012-02-09 ENCOUNTER — Ambulatory Visit (INDEPENDENT_AMBULATORY_CARE_PROVIDER_SITE_OTHER): Payer: Medicare Other | Admitting: *Deleted

## 2012-02-09 DIAGNOSIS — Z95810 Presence of automatic (implantable) cardiac defibrillator: Secondary | ICD-10-CM

## 2012-02-09 NOTE — Progress Notes (Signed)
Pt for wound check only.  Pt with 2 blackheads above incision line.  Removed today by Dr Johney Frame.  Pt to follow up as scheduled.

## 2012-03-03 ENCOUNTER — Encounter: Payer: Medicare Other | Admitting: Internal Medicine

## 2012-04-14 ENCOUNTER — Telehealth: Payer: Self-pay | Admitting: Internal Medicine

## 2012-04-14 ENCOUNTER — Other Ambulatory Visit: Payer: Self-pay | Admitting: Cardiology

## 2012-04-14 ENCOUNTER — Encounter: Payer: Self-pay | Admitting: Internal Medicine

## 2012-04-14 ENCOUNTER — Ambulatory Visit (INDEPENDENT_AMBULATORY_CARE_PROVIDER_SITE_OTHER): Payer: Medicare Other | Admitting: Internal Medicine

## 2012-04-14 VITALS — BP 135/88 | HR 72 | Ht 67.0 in | Wt 146.0 lb

## 2012-04-14 LAB — ICD DEVICE OBSERVATION
AL AMPLITUDE: 2 mv
AL IMPEDENCE ICD: 399 Ohm
AL THRESHOLD: 0.75 V
ATRIAL PACING ICD: 1 pct
BAMS-0001: 170 {beats}/min
BATTERY VOLTAGE: 3.18 V
CHARGE TIME: 8.6 s
LV LEAD IMPEDENCE ICD: 494 Ohm
LV LEAD THRESHOLD: 0.75 V
RV LEAD AMPLITUDE: 17.4 mv
RV LEAD IMPEDENCE ICD: 456 Ohm
RV LEAD THRESHOLD: 1.25 V
TZAT-0001ATACH: 1
TZAT-0001ATACH: 2
TZAT-0001ATACH: 3
TZAT-0001FASTVT: 1
TZAT-0001SLOWVT: 1
TZAT-0001SLOWVT: 2
TZAT-0002ATACH: NEGATIVE
TZAT-0002ATACH: NEGATIVE
TZAT-0002ATACH: NEGATIVE
TZAT-0004FASTVT: 8
TZAT-0004SLOWVT: 8
TZAT-0004SLOWVT: 8
TZAT-0005FASTVT: 88 pct
TZAT-0005SLOWVT: 88 pct
TZAT-0005SLOWVT: 91 pct
TZAT-0011FASTVT: 10 ms
TZAT-0011SLOWVT: 10 ms
TZAT-0011SLOWVT: 10 ms
TZAT-0012ATACH: 150 ms
TZAT-0012ATACH: 150 ms
TZAT-0012ATACH: 150 ms
TZAT-0012FASTVT: 170 ms
TZAT-0012SLOWVT: 170 ms
TZAT-0012SLOWVT: 170 ms
TZAT-0013FASTVT: 1
TZAT-0013SLOWVT: 2
TZAT-0013SLOWVT: 2
TZAT-0018ATACH: NEGATIVE
TZAT-0018ATACH: NEGATIVE
TZAT-0018ATACH: NEGATIVE
TZAT-0018FASTVT: NEGATIVE
TZAT-0018SLOWVT: NEGATIVE
TZAT-0018SLOWVT: NEGATIVE
TZAT-0019ATACH: 6 V
TZAT-0019ATACH: 6 V
TZAT-0019ATACH: 6 V
TZAT-0019FASTVT: 8 V
TZAT-0019SLOWVT: 8 V
TZAT-0019SLOWVT: 8 V
TZAT-0020ATACH: 1.5 ms
TZAT-0020ATACH: 1.5 ms
TZAT-0020ATACH: 1.5 ms
TZAT-0020FASTVT: 1.5 ms
TZAT-0020SLOWVT: 1.5 ms
TZAT-0020SLOWVT: 1.5 ms
TZON-0003ATACH: 350 ms
TZON-0003FASTVT: 240 ms
TZON-0003SLOWVT: 320 ms
TZON-0003VSLOWVT: 400 ms
TZON-0004SLOWVT: 32
TZON-0004VSLOWVT: 40
TZON-0005SLOWVT: 12
TZST-0001ATACH: 4
TZST-0001ATACH: 5
TZST-0001ATACH: 6
TZST-0001FASTVT: 2
TZST-0001FASTVT: 3
TZST-0001FASTVT: 4
TZST-0001FASTVT: 5
TZST-0001FASTVT: 6
TZST-0001SLOWVT: 3
TZST-0001SLOWVT: 4
TZST-0001SLOWVT: 5
TZST-0001SLOWVT: 6
TZST-0002ATACH: NEGATIVE
TZST-0002ATACH: NEGATIVE
TZST-0002ATACH: NEGATIVE
TZST-0003FASTVT: 30 J
TZST-0003FASTVT: 35 J
TZST-0003FASTVT: 35 J
TZST-0003FASTVT: 35 J
TZST-0003FASTVT: 35 J
TZST-0003SLOWVT: 30 J
TZST-0003SLOWVT: 35 J
TZST-0003SLOWVT: 35 J
TZST-0003SLOWVT: 35 J
VENTRICULAR PACING ICD: 99.9 pct

## 2012-04-14 MED ORDER — MIRTAZAPINE 15 MG PO TABS: 15.0000 mg | ORAL_TABLET | Freq: Every day | ORAL | Status: DC

## 2012-04-14 NOTE — Patient Instructions (Addendum)
Continue all current medications. [place]red - November Carelink every 3 months

## 2012-04-14 NOTE — Progress Notes (Signed)
PCP: Raye Sorrow, DO Primary Cardiologist:  Dr Princess Perna. is a 72 y.o. male who presents today for routine electrophysiology followup.  Since his generator change in November, the patient reports doing very well.  Today, he denies symptoms of palpitations, chest pain, shortness of breath,  lower extremity edema, dizziness, presyncope, syncope, or ICD shocks.  The patient is otherwise without complaint today.   Past Medical History  Diagnosis Date  . Cardiomyopathy   . ICD (implantable cardiac defibrillator) battery depletion     CRT-D device  . Ejection fraction     Ejection fraction 25%  /  repeat ejection fraction 50%, echo, July, 2009  . Mitral regurgitation     Severe before CRT with annular dilatation and poor coaptation of the mitral leaflets  /  mild to moderate mitral regurgitation, July, 2009, after CRT therapy  . Pulmonary hypertension     44 mmHg, echo, July, 2009  . COPD (chronic obstructive pulmonary disease)   . Smoking     Chantix cause headaches  . Lung nodule     [facility]  . Hypertension   . Dyslipidemia   . BPH (benign prostatic hyperplasia)   . Gout   . Alcohol abuse     In the past.  Stable over many years  . Migraines   . LBBB (left bundle branch block)   . CAD (coronary artery disease)     Catheterization April, 2008, single vessel disease with collateralized branch of the left circumflex  . Syncope     Probably orthostasis   Past Surgical History  Procedure Laterality Date  . Cardiac defibrillator placement  12/29/12    BiV ICD implanted, generator change 12/30/11 by Dr Johney Frame MDT Nonnie Done XT CRT-D    Current Outpatient Prescriptions  Medication Sig Dispense Refill  . aspirin 325 MG tablet Take 325 mg by mouth daily.        Marland Kitchen atorvastatin (LIPITOR) 80 MG tablet Take 80 mg by mouth daily.      . butalbital-acetaminophen-caffeine (FIORICET WITH CODEINE) 50-325-40-30 MG per capsule Take 1 capsule by mouth every 4 (four) hours  as needed.        . carvedilol (COREG) 12.5 MG tablet Take 12.5 mg by mouth 2 (two) times daily with a meal.        . divalproex (DEPAKOTE) 500 MG 24 hr tablet Take 1,500 mg by mouth at bedtime.        . finasteride (PROSCAR) 5 MG tablet Take 5 mg by mouth daily.        . folic acid (FOLVITE) 1 MG tablet Take 1 mg by mouth daily.        . furosemide (LASIX) 40 MG tablet Take 40 mg by mouth daily.        . hydroxypropyl methylcellulose (ISOPTO TEARS) 2.5 % ophthalmic solution 1 drop 4 (four) times daily as needed.       Marland Kitchen lisinopril (PRINIVIL,ZESTRIL) 40 MG tablet Take 40 mg by mouth daily.       . Multiple Vitamin (MULTIVITAMIN) tablet Take 1 tablet by mouth daily.        . nitroGLYCERIN (NITROSTAT) 0.4 MG SL tablet Place 0.4 mg under the tongue every 5 (five) minutes as needed.        Marland Kitchen omeprazole (PRILOSEC) 20 MG capsule Take 20 mg by mouth daily.        . potassium chloride SA (K-DUR,KLOR-CON) 20 MEQ tablet Take 20 mEq by  mouth daily.        Marland Kitchen tiotropium (SPIRIVA) 18 MCG inhalation capsule Place 18 mcg into inhaler and inhale every morning.        . traMADol (ULTRAM) 50 MG tablet Take 50 mg by mouth. Take 1 to 2 tablets by mouth every 8 to 12 hours as needed for pain        No current facility-administered medications for this visit.    Physical Exam: Filed Vitals:   04/14/12 0952  BP: 135/88  Pulse: 72  Height: 5\' 7"  (1.702 m)  Weight: 146 lb (66.225 kg)    GEN- The patient is well appearing, alert and oriented x 3 today.   Head- normocephalic, atraumatic Eyes-  Sclera clear, conjunctiva pink Ears- hearing intact Oropharynx- clear Lungs- Clear to ausculation bilaterally, normal work of breathing Chest- ICD pocket is well healed Heart- Regular rate and rhythm, no murmurs, rubs or gallops, PMI not laterally displaced GI- soft, NT, ND, + BS Extremities- no clubbing, cyanosis, or edema  ICD interrogation- reviewed in detail today,  See PACEART report  Assessment and  Plan:  1.  Chronic systolic dysfunction euvolemic today Stable on an appropriate medical regimen Normal ICD function See Pace Art report No changes today  carelink every 3 months Return 11/14

## 2012-04-14 NOTE — Progress Notes (Signed)
error 

## 2012-04-14 NOTE — Telephone Encounter (Signed)
Patient called to state medication that replaced Trazadone is Mirtazapine 15 mg 1 at bedtime.  He states during visit he could not remember the name of the medication.

## 2012-05-02 ENCOUNTER — Encounter: Payer: Self-pay | Admitting: Cardiology

## 2012-05-05 ENCOUNTER — Encounter: Payer: Self-pay | Admitting: Cardiology

## 2012-05-05 ENCOUNTER — Ambulatory Visit (INDEPENDENT_AMBULATORY_CARE_PROVIDER_SITE_OTHER): Payer: Medicare Other | Admitting: Cardiology

## 2012-05-05 VITALS — BP 131/88 | HR 85 | Ht 67.0 in | Wt 141.1 lb

## 2012-05-05 DIAGNOSIS — I2510 Atherosclerotic heart disease of native coronary artery without angina pectoris: Secondary | ICD-10-CM

## 2012-05-05 DIAGNOSIS — I428 Other cardiomyopathies: Secondary | ICD-10-CM

## 2012-05-05 DIAGNOSIS — F17200 Nicotine dependence, unspecified, uncomplicated: Secondary | ICD-10-CM | POA: Diagnosis not present

## 2012-05-05 DIAGNOSIS — I429 Cardiomyopathy, unspecified: Secondary | ICD-10-CM

## 2012-05-05 DIAGNOSIS — I5022 Chronic systolic (congestive) heart failure: Secondary | ICD-10-CM | POA: Diagnosis not present

## 2012-05-05 NOTE — Assessment & Plan Note (Signed)
Coronary disease is stable. No change in therapy. 

## 2012-05-05 NOTE — Assessment & Plan Note (Signed)
Patient is on appropriate medications. His left ventricular function had improved. He has not need other meds. No change in therapy.

## 2012-05-05 NOTE — Patient Instructions (Addendum)

## 2012-05-05 NOTE — Assessment & Plan Note (Signed)
His volume status is stable. No change in therapy. 

## 2012-05-05 NOTE — Assessment & Plan Note (Signed)
Once again I have counseled him to stop smoking. He always says that he is cutting back.

## 2012-05-05 NOTE — Progress Notes (Signed)
HPI  Patient seen in followup cardiomyopathy. He's doing well. He is actually quite active. He had his ICD upgraded in November, 2013. He's seen Dr. Johney Frame back in March, 2014 and everything was stable.  Allergies  Allergen Reactions  . Morphine Sulfate     REACTION: internal hemorrage  . Mupirocin Rash    Current Outpatient Prescriptions  Medication Sig Dispense Refill  . aspirin 325 MG tablet Take 325 mg by mouth daily.        Paul [name] atorvastatin (LIPITOR) 80 MG tablet Take 80 mg by mouth daily.      . butalbital-acetaminophen-caffeine (FIORICET WITH CODEINE) 50-325-40-30 MG per capsule Take 1 capsule by mouth every 4 (four) hours as needed.        . carvedilol (COREG) 12.5 MG tablet Take 12.5 mg by mouth 2 (two) times daily with a meal.        . divalproex (DEPAKOTE) 500 MG 24 hr tablet Take 1,500 mg by mouth at bedtime.        . finasteride (PROSCAR) 5 MG tablet Take 5 mg by mouth daily.        . folic acid (FOLVITE) 1 MG tablet Take 1 mg by mouth daily.        . furosemide (LASIX) 40 MG tablet Take 40 mg by mouth daily.        . hydroxypropyl methylcellulose (ISOPTO TEARS) 2.5 % ophthalmic solution 1 drop 4 (four) times daily as needed.       Paul [name] lisinopril (PRINIVIL,ZESTRIL) 40 MG tablet Take 40 mg by mouth daily.       . mirtazapine (REMERON) 15 MG tablet Take 1 tablet (15 mg total) by mouth at bedtime.  30 tablet  0  . Multiple Vitamin (MULTIVITAMIN) tablet Take 1 tablet by mouth daily.        . nitroGLYCERIN (NITROSTAT) 0.4 MG SL tablet Place 0.4 mg under the tongue every 5 (five) minutes as needed.        Paul [name] omeprazole (PRILOSEC) 20 MG capsule Take 20 mg by mouth daily.        . potassium chloride SA (K-DUR,KLOR-CON) 20 MEQ tablet Take 20 mEq by mouth daily.        Paul [name] tiotropium (SPIRIVA) 18 MCG inhalation capsule Place 18 mcg into inhaler and inhale every morning.        . traMADol (ULTRAM) 50 MG tablet Take 50 mg by mouth. Take 1 to 2 tablets by mouth every 8 to 12 hours as needed  for pain        No current facility-administered medications for this visit.    History   Social History  . Marital Status: Married    Spouse Name: N/A    Number of Children: 4  . Years of Education: N/A   Occupational History  . RETIRED    Social History Main Topics  . Smoking status: Current Every Day Smoker -- 0.50 packs/day for 45 years    Types: Cigarettes  . Smokeless tobacco: Never Used     Comment: He remains unwilling to quit  . Alcohol Use: No  . Drug Use: No  . Sexually Active: Not on file   Other Topics Concern  . Not on file   Social History Narrative  . No narrative on file    No family history on file.  Past Medical History  Diagnosis Date  . Cardiomyopathy   . ICD (implantable cardiac defibrillator) battery depletion     CRT-D device  .  Ejection fraction     Ejection fraction 25%  /  repeat ejection fraction 50%, echo, July, 2009  . Mitral regurgitation     Severe before CRT with annular dilatation and poor coaptation of the mitral leaflets  /  mild to moderate mitral regurgitation, July, 2009, after CRT therapy  . Pulmonary hypertension     44 mmHg, echo, July, 2009  . COPD (chronic obstructive pulmonary disease)   . Smoking     Chantix cause headaches  . Lung nodule     [facility]  . Hypertension   . Dyslipidemia   . BPH (benign prostatic hyperplasia)   . Gout   . Alcohol abuse     In the past.  Stable over many years  . Migraines   . LBBB (left bundle branch block)   . CAD (coronary artery disease)     Catheterization April, 2008, single vessel disease with collateralized branch of the left circumflex  . Syncope     Probably orthostasis    Past Surgical History  Procedure Laterality Date  . Cardiac defibrillator placement  12/29/12    BiV ICD implanted, generator change 12/30/11 by Dr Johney Frame MDT Nonnie Done XT CRT-D    Patient Active Problem List  Diagnosis  . Cardiomyopathy  . ICD (implantable cardiac defibrillator)  battery depletion  . Ejection fraction  . Mitral regurgitation  . Pulmonary hypertension  . COPD (chronic obstructive pulmonary disease)  . Smoking  . Lung nodule  . Hypertension  . Dyslipidemia  . BPH (benign prostatic hyperplasia)  . Gout  . Migraines  . LBBB (left bundle branch block)  . CAD (coronary artery disease)  . Syncope  . Chronic systolic heart failure    ROS   Patient denies fever, chills, headache, sweats, rash, change in vision, change in hearing, chest pain, cough, nausea vomiting, urinary symptoms. He has mild discomfort on his left side when he sleeps in one position in bed. This does not sound significant.  PHYSICAL EXAM  Patient is oriented to person time and place. Affect is normal. He has poor dentition. He has an odor of cigarette smoke. There is no jugulovenous distention. Lungs reveal decreased breath sounds. There is no respiratory distress. Cardiac exam reveals S1 and S2. There no clicks or significant murmurs. The abdomen is soft. There is no peripheral edema.  Filed Vitals:   05/05/12 1305  BP: 131/88  Pulse: 85  Height: 5\' 7"  (1.702 m)  Weight: 141 lb 1.9 oz (64.012 kg)  SpO2: 98%     ASSESSMENT & PLAN

## 2012-07-19 ENCOUNTER — Encounter: Payer: Self-pay | Admitting: Internal Medicine

## 2012-07-19 ENCOUNTER — Ambulatory Visit (INDEPENDENT_AMBULATORY_CARE_PROVIDER_SITE_OTHER): Payer: Medicare Other | Admitting: *Deleted

## 2012-07-19 DIAGNOSIS — Z4502 Encounter for adjustment and management of automatic implantable cardiac defibrillator: Secondary | ICD-10-CM | POA: Diagnosis not present

## 2012-07-19 DIAGNOSIS — I5022 Chronic systolic (congestive) heart failure: Secondary | ICD-10-CM | POA: Diagnosis not present

## 2012-07-19 DIAGNOSIS — I428 Other cardiomyopathies: Secondary | ICD-10-CM | POA: Diagnosis not present

## 2012-07-19 DIAGNOSIS — I429 Cardiomyopathy, unspecified: Secondary | ICD-10-CM

## 2012-08-03 LAB — REMOTE ICD DEVICE
AL IMPEDENCE ICD: 399 Ohm
AL THRESHOLD: 0.625 V
BATTERY VOLTAGE: 3.1687 V
LV LEAD IMPEDENCE ICD: 494 Ohm
RV LEAD AMPLITUDE: 3.6 mv
RV LEAD IMPEDENCE ICD: 456 Ohm
TOT-0002: 0
TOT-0006: 20131126000000
TZAT-0001ATACH: 3
TZAT-0001FASTVT: 1
TZAT-0001SLOWVT: 1
TZAT-0002ATACH: NEGATIVE
TZAT-0002ATACH: NEGATIVE
TZAT-0004FASTVT: 8
TZAT-0012ATACH: 150 ms
TZAT-0012ATACH: 150 ms
TZAT-0013FASTVT: 1
TZAT-0018ATACH: NEGATIVE
TZAT-0018SLOWVT: NEGATIVE
TZAT-0018SLOWVT: NEGATIVE
TZAT-0019ATACH: 6 V
TZAT-0019ATACH: 6 V
TZAT-0019SLOWVT: 8 V
TZAT-0019SLOWVT: 8 V
TZAT-0020ATACH: 1.5 ms
TZAT-0020ATACH: 1.5 ms
TZAT-0020ATACH: 1.5 ms
TZAT-0020FASTVT: 1.5 ms
TZAT-0020SLOWVT: 1.5 ms
TZAT-0020SLOWVT: 1.5 ms
TZON-0003VSLOWVT: 400 ms
TZST-0001ATACH: 5
TZST-0001ATACH: 6
TZST-0001FASTVT: 2
TZST-0001FASTVT: 4
TZST-0001FASTVT: 6
TZST-0001SLOWVT: 5
TZST-0002ATACH: NEGATIVE
TZST-0002ATACH: NEGATIVE
TZST-0003FASTVT: 35 J
TZST-0003FASTVT: 35 J
TZST-0003SLOWVT: 30 J
TZST-0003SLOWVT: 35 J
VENTRICULAR PACING ICD: 99.78 pct

## 2012-08-11 ENCOUNTER — Encounter: Payer: Self-pay | Admitting: *Deleted

## 2012-11-17 ENCOUNTER — Ambulatory Visit (INDEPENDENT_AMBULATORY_CARE_PROVIDER_SITE_OTHER): Payer: Medicare Other | Admitting: *Deleted

## 2012-11-17 ENCOUNTER — Encounter: Payer: Self-pay | Admitting: Internal Medicine

## 2012-11-17 DIAGNOSIS — Z4502 Encounter for adjustment and management of automatic implantable cardiac defibrillator: Secondary | ICD-10-CM

## 2012-11-17 DIAGNOSIS — I5022 Chronic systolic (congestive) heart failure: Secondary | ICD-10-CM

## 2012-11-17 DIAGNOSIS — I428 Other cardiomyopathies: Secondary | ICD-10-CM

## 2012-11-17 DIAGNOSIS — I429 Cardiomyopathy, unspecified: Secondary | ICD-10-CM

## 2012-11-29 LAB — REMOTE ICD DEVICE
AL IMPEDENCE ICD: 342 Ohm
AL THRESHOLD: 0.75 V
ATRIAL PACING ICD: 0.12 pct
CHARGE TIME: 8.288 s
FVT: 0
RV LEAD IMPEDENCE ICD: 494 Ohm
RV LEAD THRESHOLD: 1 V
TOT-0001: 0
TOT-0006: 20131126000000
TZAT-0001ATACH: 2
TZAT-0001FASTVT: 1
TZAT-0002ATACH: NEGATIVE
TZAT-0004FASTVT: 8
TZAT-0004SLOWVT: 8
TZAT-0004SLOWVT: 8
TZAT-0005FASTVT: 88 pct
TZAT-0011SLOWVT: 10 ms
TZAT-0011SLOWVT: 10 ms
TZAT-0012ATACH: 150 ms
TZAT-0012ATACH: 150 ms
TZAT-0012SLOWVT: 170 ms
TZAT-0012SLOWVT: 170 ms
TZAT-0013FASTVT: 1
TZAT-0018ATACH: NEGATIVE
TZAT-0019ATACH: 6 V
TZAT-0019SLOWVT: 8 V
TZAT-0020ATACH: 1.5 ms
TZAT-0020ATACH: 1.5 ms
TZAT-0020ATACH: 1.5 ms
TZAT-0020SLOWVT: 1.5 ms
TZAT-0020SLOWVT: 1.5 ms
TZON-0003ATACH: 350 ms
TZON-0003SLOWVT: 320 ms
TZON-0003VSLOWVT: 400 ms
TZON-0004VSLOWVT: 40
TZST-0001ATACH: 4
TZST-0001FASTVT: 2
TZST-0001FASTVT: 5
TZST-0001SLOWVT: 5
TZST-0002ATACH: NEGATIVE
TZST-0003FASTVT: 30 J
TZST-0003FASTVT: 35 J
TZST-0003FASTVT: 35 J
TZST-0003SLOWVT: 30 J
TZST-0003SLOWVT: 35 J
TZST-0003SLOWVT: 35 J

## 2012-12-02 ENCOUNTER — Encounter: Payer: Self-pay | Admitting: *Deleted

## 2012-12-15 ENCOUNTER — Encounter: Payer: Self-pay | Admitting: Internal Medicine

## 2012-12-15 ENCOUNTER — Ambulatory Visit (INDEPENDENT_AMBULATORY_CARE_PROVIDER_SITE_OTHER): Payer: Medicare Other | Admitting: Internal Medicine

## 2012-12-15 VITALS — BP 123/86 | HR 68 | Ht 67.0 in | Wt 164.0 lb

## 2012-12-15 DIAGNOSIS — I5022 Chronic systolic (congestive) heart failure: Secondary | ICD-10-CM | POA: Diagnosis not present

## 2012-12-15 DIAGNOSIS — I429 Cardiomyopathy, unspecified: Secondary | ICD-10-CM

## 2012-12-15 DIAGNOSIS — I428 Other cardiomyopathies: Secondary | ICD-10-CM | POA: Diagnosis not present

## 2012-12-15 DIAGNOSIS — F17200 Nicotine dependence, unspecified, uncomplicated: Secondary | ICD-10-CM

## 2012-12-15 DIAGNOSIS — IMO0001 Reserved for inherently not codable concepts without codable children: Secondary | ICD-10-CM

## 2012-12-15 LAB — MDC_IDC_ENUM_SESS_TYPE_INCLINIC
Brady Statistic AP VP Percent: 0.16 %
Brady Statistic AP VS Percent: 0.01 %
Brady Statistic AS VP Percent: 99.72 %
Brady Statistic RA Percent Paced: 0.16 %
Brady Statistic RV Percent Paced: 99.88 %
Date Time Interrogation Session: 20141112115826
HighPow Impedance: 190 Ohm
HighPow Impedance: 399 Ohm
HighPow Impedance: 62 Ohm
Lead Channel Impedance Value: 437 Ohm
Lead Channel Impedance Value: 570 Ohm
Lead Channel Impedance Value: 893 Ohm
Lead Channel Sensing Intrinsic Amplitude: 2 mV
Lead Channel Setting Pacing Amplitude: 2.5 V
Lead Channel Setting Pacing Pulse Width: 0.4 ms
Zone Setting Detection Interval: 240 ms
Zone Setting Detection Interval: 270 ms
Zone Setting Detection Interval: 350 ms
Zone Setting Detection Interval: 400 ms

## 2012-12-15 NOTE — Patient Instructions (Signed)
Continue all current medications. Your physician wants you to follow up in:  1 year.  You will receive a reminder letter in the mail one-two months in advance.  If you don't receive a letter, please call our office to schedule the follow up appointment - [facility]  

## 2012-12-15 NOTE — Progress Notes (Signed)
PCP: Raye Sorrow, DO Primary Cardiologist:  Dr Princess Perna. is a 72 y.o. male who presents today for routine electrophysiology followup.  Since his last office visit, the patient reports doing very well.  He had an episode of postural dizziness 2 weeks ago.  He reports that he stood "too quickly" and became dizzy.  He fell and hit his R forearm on a doorknob.  He did not have syncope.  He has had no further episodes. Today, he denies symptoms of palpitations, chest pain, shortness of breath,  lower extremity edema, syncope, or ICD shocks.  The patient is otherwise without complaint today.   Past Medical History  Diagnosis Date  . Cardiomyopathy   . ICD (implantable cardiac defibrillator) battery depletion     CRT-D device  . Ejection fraction     Ejection fraction 25%  /  repeat ejection fraction 50%, echo, July, 2009  . Mitral regurgitation     Severe before CRT with annular dilatation and poor coaptation of the mitral leaflets  /  mild to moderate mitral regurgitation, July, 2009, after CRT therapy  . Pulmonary hypertension     44 mmHg, echo, July, 2009  . COPD (chronic obstructive pulmonary disease)   . Smoking     Chantix cause headaches  . Lung nodule     [facility]  . Hypertension   . Dyslipidemia   . BPH (benign prostatic hyperplasia)   . Gout   . Alcohol abuse     In the past.  Stable over many years  . Migraines   . LBBB (left bundle branch block)   . CAD (coronary artery disease)     Catheterization April, 2008, single vessel disease with collateralized branch of the left circumflex  . Syncope     Probably orthostasis   Past Surgical History  Procedure Laterality Date  . Cardiac defibrillator placement  12/29/12    BiV ICD implanted, generator change 12/30/11 by Dr Johney Frame MDT Nonnie Done XT CRT-D    Current Outpatient Prescriptions  Medication Sig Dispense Refill  . aspirin 325 MG tablet Take 325 mg by mouth daily.        Marland Kitchen atorvastatin  (LIPITOR) 80 MG tablet Take 80 mg by mouth daily.      . butalbital-acetaminophen-caffeine (FIORICET WITH CODEINE) 50-325-40-30 MG per capsule Take 1 capsule by mouth every 4 (four) hours as needed.        . carvedilol (COREG) 12.5 MG tablet Take 12.5 mg by mouth 2 (two) times daily with a meal.        . Cholecalciferol (VITAMIN D3) 2000 UNITS TABS Take 1 tablet by mouth daily.      . divalproex (DEPAKOTE) 500 MG 24 hr tablet Take 1,500 mg by mouth at bedtime.        . finasteride (PROSCAR) 5 MG tablet Take 5 mg by mouth daily.        . folic acid (FOLVITE) 1 MG tablet Take 1 mg by mouth daily.        . furosemide (LASIX) 40 MG tablet Take 40 mg by mouth daily.        . hydroxypropyl methylcellulose (ISOPTO TEARS) 2.5 % ophthalmic solution 1 drop 4 (four) times daily as needed.       Marland Kitchen lisinopril (PRINIVIL,ZESTRIL) 40 MG tablet Take 40 mg by mouth daily.       . mirtazapine (REMERON) 15 MG tablet Take 1 tablet (15 mg total) by mouth at  bedtime.  30 tablet  0  . Multiple Vitamin (MULTIVITAMIN) tablet Take 1 tablet by mouth daily.        . nitroGLYCERIN (NITROSTAT) 0.4 MG SL tablet Place 0.4 mg under the tongue every 5 (five) minutes as needed.        Marland Kitchen omeprazole (PRILOSEC) 20 MG capsule Take 20 mg by mouth daily.        . potassium chloride SA (K-DUR,KLOR-CON) 20 MEQ tablet Take 20 mEq by mouth daily.        Marland Kitchen tiotropium (SPIRIVA) 18 MCG inhalation capsule Place 18 mcg into inhaler and inhale every morning.        . traMADol (ULTRAM) 50 MG tablet Take 50 mg by mouth. Take 1 to 2 tablets by mouth every 8 to 12 hours as needed for pain        No current facility-administered medications for this visit.    Physical Exam: Filed Vitals:   12/15/12 0858  BP: 123/86  Pulse: 68  Height: 5\' 7"  (1.702 m)  Weight: 164 lb (74.39 kg)    GEN- The patient is well appearing, alert and oriented x 3 today.   Head- normocephalic, atraumatic Eyes-  Sclera clear, conjunctiva pink Ears- hearing  intact Oropharynx- clear Lungs- Clear to ausculation bilaterally, normal work of breathing Chest- ICD pocket is well healed Heart- Regular rate and rhythm, no murmurs, rubs or gallops, PMI not laterally displaced GI- soft, NT, ND, + BS Extremities- no clubbing, cyanosis, or edema R arm with ecchymosis  ICD interrogation- reviewed in detail today,  See PACEART report  Assessment and Plan:  1.  Chronic systolic dysfunction euvolemic today Stable on an appropriate medical regimen Normal ICD function See Pace Art report No changes today  2. Postural dizziness I have encouraged him to rise slowly I have recommended CBC and BMET to exclude metabolic causes.  He declines blood work today stating "I had blood work this past summer at the [place]".  3. Tobacco He quit in June after his wife was hospitalized for COPD.  I am very encouraged by this.  carelink every 3 months Return in 1 year

## 2012-12-29 HISTORY — PX: CARDIAC DEFIBRILLATOR PLACEMENT: SHX171

## 2013-03-17 ENCOUNTER — Encounter: Payer: Medicare Other | Admitting: *Deleted

## 2013-03-22 ENCOUNTER — Encounter: Payer: Self-pay | Admitting: Internal Medicine

## 2013-03-22 ENCOUNTER — Ambulatory Visit (INDEPENDENT_AMBULATORY_CARE_PROVIDER_SITE_OTHER): Payer: Medicare Other

## 2013-03-22 DIAGNOSIS — I5022 Chronic systolic (congestive) heart failure: Secondary | ICD-10-CM | POA: Diagnosis not present

## 2013-03-22 DIAGNOSIS — I428 Other cardiomyopathies: Secondary | ICD-10-CM | POA: Diagnosis not present

## 2013-03-22 DIAGNOSIS — I429 Cardiomyopathy, unspecified: Secondary | ICD-10-CM

## 2013-03-23 ENCOUNTER — Encounter: Payer: Self-pay | Admitting: *Deleted

## 2013-03-26 LAB — MDC_IDC_ENUM_SESS_TYPE_REMOTE
Battery Voltage: 3.14 V
Brady Statistic AP VS Percent: 0.01 %
Brady Statistic AS VP Percent: 99.56 %
HIGH POWER IMPEDANCE MEASURED VALUE: 247 Ohm
HIGH POWER IMPEDANCE MEASURED VALUE: 437 Ohm
HIGH POWER IMPEDANCE MEASURED VALUE: 53 Ohm
HighPow Impedance: 71 Ohm
Lead Channel Impedance Value: 437 Ohm
Lead Channel Impedance Value: 513 Ohm
Lead Channel Impedance Value: 532 Ohm
Lead Channel Impedance Value: 969 Ohm
Lead Channel Pacing Threshold Amplitude: 0.75 V
Lead Channel Pacing Threshold Pulse Width: 0.4 ms
Lead Channel Pacing Threshold Pulse Width: 0.4 ms
Lead Channel Setting Pacing Amplitude: 2 V
Lead Channel Setting Pacing Amplitude: 2.5 V
Lead Channel Setting Pacing Pulse Width: 0.4 ms
Lead Channel Setting Sensing Sensitivity: 0.3 mV
MDC IDC MSMT LEADCHNL LV IMPEDANCE VALUE: 646 Ohm
MDC IDC MSMT LEADCHNL LV PACING THRESHOLD PULSEWIDTH: 0.4 ms
MDC IDC MSMT LEADCHNL RA PACING THRESHOLD AMPLITUDE: 0.75 V
MDC IDC MSMT LEADCHNL RA SENSING INTR AMPL: 1.875 mV
MDC IDC MSMT LEADCHNL RV PACING THRESHOLD AMPLITUDE: 1.25 V
MDC IDC MSMT LEADCHNL RV SENSING INTR AMPL: 19.625 mV
MDC IDC SESS DTM: 20150217201458
MDC IDC SET LEADCHNL LV PACING AMPLITUDE: 2 V
MDC IDC SET LEADCHNL LV PACING PULSEWIDTH: 0.4 ms
MDC IDC SET ZONE DETECTION INTERVAL: 270 ms
MDC IDC SET ZONE DETECTION INTERVAL: 350 ms
MDC IDC STAT BRADY AP VP PERCENT: 0.27 %
MDC IDC STAT BRADY AS VS PERCENT: 0.17 %
MDC IDC STAT BRADY RA PERCENT PACED: 0.27 %
MDC IDC STAT BRADY RV PERCENT PACED: 99.83 %
Zone Setting Detection Interval: 240 ms
Zone Setting Detection Interval: 320 ms
Zone Setting Detection Interval: 400 ms

## 2013-04-04 ENCOUNTER — Encounter: Payer: Self-pay | Admitting: *Deleted

## 2013-04-25 ENCOUNTER — Encounter: Payer: Self-pay | Admitting: *Deleted

## 2013-04-25 ENCOUNTER — Other Ambulatory Visit: Payer: Self-pay | Admitting: *Deleted

## 2013-04-25 ENCOUNTER — Ambulatory Visit (INDEPENDENT_AMBULATORY_CARE_PROVIDER_SITE_OTHER): Payer: Medicare Other | Admitting: *Deleted

## 2013-04-25 DIAGNOSIS — I5022 Chronic systolic (congestive) heart failure: Secondary | ICD-10-CM

## 2013-04-25 DIAGNOSIS — Z95810 Presence of automatic (implantable) cardiac defibrillator: Secondary | ICD-10-CM

## 2013-04-25 MED ORDER — CARVEDILOL 12.5 MG PO TABS: 12.5000 mg | ORAL_TABLET | Freq: Two times a day (BID) | ORAL | Status: DC

## 2013-04-25 MED ORDER — LISINOPRIL 40 MG PO TABS: 40.0000 mg | ORAL_TABLET | Freq: Every day | ORAL | Status: DC

## 2013-04-25 NOTE — Progress Notes (Signed)
EPIC Encounter for ICM Monitoring  Patient Name: Paul [name]. is a 73 y.o. male Date: 04/25/2013 Primary Care Physican: Caralyn Guile, DO Primary Cardiologist: Ron Parker Electrophysiologist: Allred Dry Weight: 145 lbs  Bi-V pacing: 99.3 %      In the past month, have you:  1. Gained more than 2 pounds in a day or more than 5 pounds in a week? no  2. Had changes in your medications (with verification of current medications)? no  3. Had more shortness of breath than is usual for you? no  4. Limited your activity because of shortness of breath? no  5. Not been able to sleep because of shortness of breath? no  6. Had increased swelling in your feet or ankles? no  7. Had symptoms of dehydration (dizziness, dry mouth, increased thirst, decreased urine output) no  8. Had changes in sodium restriction? no  9. Been compliant with medication? Yes. ( The patient states he has been out of his lisinopril and carvedilol for about a week. He is waiting on his medication from the [place]. I will send in a months supply to [facility] today).    ICM trend:   Follow-up plan: ICM clinic phone appointment: 05/26/13  Copy of note sent to patient's primary care physician, primary cardiologist, and device following physician.  Paul Lemmings, RN, BSN 04/25/2013 2:48 PM

## 2013-05-26 ENCOUNTER — Ambulatory Visit (INDEPENDENT_AMBULATORY_CARE_PROVIDER_SITE_OTHER): Payer: Medicare Other | Admitting: *Deleted

## 2013-05-26 ENCOUNTER — Encounter: Payer: Self-pay | Admitting: *Deleted

## 2013-05-26 DIAGNOSIS — Z95810 Presence of automatic (implantable) cardiac defibrillator: Secondary | ICD-10-CM | POA: Diagnosis not present

## 2013-05-26 DIAGNOSIS — I5022 Chronic systolic (congestive) heart failure: Secondary | ICD-10-CM | POA: Diagnosis not present

## 2013-05-26 NOTE — Progress Notes (Signed)
EPIC Encounter for ICM Monitoring  Patient Name: Paul [name]. is a 73 y.o. male Date: 05/26/2013 Primary Care Physican: Caralyn Guile, DO Primary Cardiologist: Ron Parker Electrophysiologist: Allred Dry Weight: 147 lbs       In the past month, have you:  1. Gained more than 2 pounds in a day or more than 5 pounds in a week? no  2. Had changes in your medications (with verification of current medications)? no  3. Had more shortness of breath than is usual for you? no  4. Limited your activity because of shortness of breath? no  5. Not been able to sleep because of shortness of breath? no  6. Had increased swelling in your feet or ankles? no  7. Had symptoms of dehydration (dizziness, dry mouth, increased thirst, decreased urine output) no  8. Had changes in sodium restriction? no  9. Been compliant with medication? Yes   ICM trend:   Follow-up plan: ICM clinic phone appointment: 06/30/13.  Copy of note sent to patient's primary care physician, primary cardiologist, and device following physician.  Emily Filbert, RN, BSN 05/26/2013 2:36 PM

## 2013-06-01 ENCOUNTER — Encounter: Payer: Self-pay | Admitting: Cardiology

## 2013-06-01 ENCOUNTER — Ambulatory Visit (INDEPENDENT_AMBULATORY_CARE_PROVIDER_SITE_OTHER): Payer: Medicare Other | Admitting: Cardiology

## 2013-06-01 VITALS — BP 120/82 | HR 58 | Ht 67.0 in | Wt 161.4 lb

## 2013-06-01 DIAGNOSIS — F17200 Nicotine dependence, unspecified, uncomplicated: Secondary | ICD-10-CM

## 2013-06-01 DIAGNOSIS — I059 Rheumatic mitral valve disease, unspecified: Secondary | ICD-10-CM

## 2013-06-01 DIAGNOSIS — I428 Other cardiomyopathies: Secondary | ICD-10-CM

## 2013-06-01 DIAGNOSIS — I2510 Atherosclerotic heart disease of native coronary artery without angina pectoris: Secondary | ICD-10-CM | POA: Diagnosis not present

## 2013-06-01 DIAGNOSIS — I429 Cardiomyopathy, unspecified: Secondary | ICD-10-CM

## 2013-06-01 DIAGNOSIS — I5022 Chronic systolic (congestive) heart failure: Secondary | ICD-10-CM | POA: Diagnosis not present

## 2013-06-01 DIAGNOSIS — I10 Essential (primary) hypertension: Secondary | ICD-10-CM

## 2013-06-01 DIAGNOSIS — I340 Nonrheumatic mitral (valve) insufficiency: Secondary | ICD-10-CM

## 2013-06-01 NOTE — Progress Notes (Signed)
Patient ID: Paul Voth., male   DOB: Mar 29, 1940, 73 y.o.   MRN: [id number]    HPI  Patient is seen today in followup cardiomyopathy. He does very well. His LV dysfunction improved when he received a CRT device. He follows with Dr. Rayann Heman in these doing well. He's not having any significant shortness of breath. His wife passed away in February 07, 2013. She had severe COPD. Allergies  Allergen Reactions  . Morphine Sulfate     REACTION: internal hemorrage  . Mupirocin Rash    Current Outpatient Prescriptions  Medication Sig Dispense Refill  . aspirin 325 MG tablet Take 325 mg by mouth daily.        Marland Kitchen atorvastatin (LIPITOR) 80 MG tablet Take 80 mg by mouth daily.      . butalbital-acetaminophen-caffeine (FIORICET WITH CODEINE) 50-325-40-30 MG per capsule Take 1 capsule by mouth every 4 (four) hours as needed.        . carvedilol (COREG) 12.5 MG tablet Take 1 tablet (12.5 mg total) by mouth 2 (two) times daily with a meal.  60 tablet  1  . Cholecalciferol (VITAMIN D3) 2000 UNITS TABS Take 1 tablet by mouth daily.      . divalproex (DEPAKOTE) 500 MG 24 hr tablet Take 1,500 mg by mouth at bedtime.        . finasteride (PROSCAR) 5 MG tablet Take 5 mg by mouth daily.        . folic acid (FOLVITE) 1 MG tablet Take 1 mg by mouth daily.        . furosemide (LASIX) 40 MG tablet Take 40 mg by mouth daily.        . hydroxypropyl methylcellulose (ISOPTO TEARS) 2.5 % ophthalmic solution 1 drop 4 (four) times daily as needed.       Marland Kitchen lisinopril (PRINIVIL,ZESTRIL) 40 MG tablet Take 1 tablet (40 mg total) by mouth daily.  30 tablet  1  . mirtazapine (REMERON) 15 MG tablet Take 1 tablet (15 mg total) by mouth at bedtime.  30 tablet  0  . Multiple Vitamin (MULTIVITAMIN) tablet Take 1 tablet by mouth daily.        . nitroGLYCERIN (NITROSTAT) 0.4 MG SL tablet Place 0.4 mg under the tongue every 5 (five) minutes as needed.        Marland Kitchen omeprazole (PRILOSEC) 20 MG capsule Take 20 mg by mouth daily.        .  potassium chloride SA (K-DUR,KLOR-CON) 20 MEQ tablet Take 20 mEq by mouth daily.        Marland Kitchen tiotropium (SPIRIVA) 18 MCG inhalation capsule Place 18 mcg into inhaler and inhale every morning.        . traMADol (ULTRAM) 50 MG tablet Take 50 mg by mouth. Take 1 to 2 tablets by mouth every 8 to 12 hours as needed for pain        No current facility-administered medications for this visit.    History   Social History  . Marital Status: Widowed    Spouse Name: N/A    Number of Children: 4  . Years of Education: N/A   Occupational History  . RETIRED    Social History Main Topics  . Smoking status: Former Smoker -- 0.50 packs/day for 45 years    Types: Cigarettes    Start date: 02/04/1956    Quit date: 07/15/2012  . Smokeless tobacco: Never Used     Comment: he quit in June!  . Alcohol Use: No  .  Drug Use: No  . Sexual Activity: Not on file   Other Topics Concern  . Not on file   Social History Narrative  . No narrative on file    No family history on file.  Past Medical History  Diagnosis Date  . Cardiomyopathy   . ICD (implantable cardiac defibrillator) battery depletion     CRT-D device  . Ejection fraction     Ejection fraction 25%  /  repeat ejection fraction 50%, echo, July, 2009  . Mitral regurgitation     Severe before CRT with annular dilatation and poor coaptation of the mitral leaflets  /  mild to moderate mitral regurgitation, July, 2009, after CRT therapy  . Pulmonary hypertension     44 mmHg, echo, July, 2009  . COPD (chronic obstructive pulmonary disease)   . Smoking     Chantix cause headaches  . Lung nodule     [facility]  . Hypertension   . Dyslipidemia   . BPH (benign prostatic hyperplasia)   . Gout   . Alcohol abuse     In the past.  Stable over many years  . Migraines   . LBBB (left bundle branch block)   . CAD (coronary artery disease)     Catheterization April, 2008, single vessel disease with collateralized branch of the left  circumflex  . Syncope     Probably orthostasis    Past Surgical History  Procedure Laterality Date  . Cardiac defibrillator placement  12/29/12    BiV ICD implanted, generator change 12/30/11 by Dr Rayann Heman MDT Sim Boast XT CRT-D    Patient Active Problem List   Diagnosis Date Noted  . Chronic systolic heart failure 17/00/1749  . Cardiomyopathy   . Encounter for servicing of automatic implantable cardioverter-defibrillator (AICD) at end of battery life   . Ejection fraction   . Mitral regurgitation   . Pulmonary hypertension   . COPD (chronic obstructive pulmonary disease)   . Smoking   . Lung nodule   . Hypertension   . Dyslipidemia   . BPH (benign prostatic hyperplasia)   . Gout   . Migraines   . LBBB (left bundle branch block)   . CAD (coronary artery disease)   . Syncope     ROS   Patient denies fever, chills, headache, sweats, rash, change in vision, change in hearing, chest pain, cough, nausea vomiting, urinary symptoms. All other systems are reviewed and are negative.  PHYSICAL EXAM   Patient is oriented to person time and place. Affect is normal. He does smell of cigarette use. Otherwise he looks good. Head is atraumatic. Sclera and conjunctiva are normal. He has poor dentition. Lungs reveal decreased breath sounds. There is no respiratory distress. Cardiac exam reveals S1 and S2. The abdomen is soft. There is no peripheral edema. There no musculoskeletal deformities. There are no skin rashes.  Filed Vitals:   06/01/13 1317  BP: 120/82  Pulse: 58  Height: 5\' 7"  (1.702 m)  Weight: 161 lb 6.4 oz (73.211 kg)  SpO2: 97%     ASSESSMENT & PLAN

## 2013-06-01 NOTE — Assessment & Plan Note (Signed)
He had stopped smoking for a period of time. He started again after his wife died. He says that he will begin to try to quit again.

## 2013-06-01 NOTE — Assessment & Plan Note (Signed)
Patient's LV improved when he received CRT therapy. He continues to do quite well. No further workup.

## 2013-06-01 NOTE — Assessment & Plan Note (Signed)
Volume status is well controlled. His LV function had returned to a good level. No change in therapy.

## 2013-06-01 NOTE — Assessment & Plan Note (Signed)
Coronary disease is stable. No change in therapy. 

## 2013-06-01 NOTE — Assessment & Plan Note (Signed)
When I see him next we will consider followup 2-D echo.

## 2013-06-01 NOTE — Patient Instructions (Signed)

## 2013-06-01 NOTE — Assessment & Plan Note (Signed)
Blood pressures controlled. No change in therapy. 

## 2013-06-30 ENCOUNTER — Ambulatory Visit (INDEPENDENT_AMBULATORY_CARE_PROVIDER_SITE_OTHER): Payer: Medicare Other | Admitting: *Deleted

## 2013-06-30 DIAGNOSIS — I5022 Chronic systolic (congestive) heart failure: Secondary | ICD-10-CM | POA: Diagnosis not present

## 2013-06-30 DIAGNOSIS — I429 Cardiomyopathy, unspecified: Secondary | ICD-10-CM

## 2013-06-30 DIAGNOSIS — I428 Other cardiomyopathies: Secondary | ICD-10-CM | POA: Diagnosis not present

## 2013-06-30 NOTE — Progress Notes (Signed)
Remote ICD transmission.   

## 2013-07-25 ENCOUNTER — Telehealth: Payer: Self-pay | Admitting: *Deleted

## 2013-07-25 ENCOUNTER — Encounter: Payer: Self-pay | Admitting: *Deleted

## 2013-07-25 LAB — MDC_IDC_ENUM_SESS_TYPE_REMOTE
Battery Voltage: 3.13 V
Brady Statistic AP VP Percent: 0.9 %
HIGH POWER IMPEDANCE MEASURED VALUE: 437 Ohm
HIGH POWER IMPEDANCE MEASURED VALUE: 58 Ohm
HighPow Impedance: 190 Ohm
HighPow Impedance: 49 Ohm
Lead Channel Impedance Value: 494 Ohm
Lead Channel Pacing Threshold Amplitude: 1.125 V
Lead Channel Pacing Threshold Pulse Width: 0.4 ms
Lead Channel Pacing Threshold Pulse Width: 0.4 ms
Lead Channel Setting Pacing Amplitude: 2 V
Lead Channel Setting Pacing Amplitude: 2.5 V
Lead Channel Setting Pacing Pulse Width: 0.4 ms
Lead Channel Setting Pacing Pulse Width: 0.4 ms
MDC IDC MSMT LEADCHNL LV IMPEDANCE VALUE: 570 Ohm
MDC IDC MSMT LEADCHNL LV IMPEDANCE VALUE: 855 Ohm
MDC IDC MSMT LEADCHNL LV PACING THRESHOLD AMPLITUDE: 0.75 V
MDC IDC MSMT LEADCHNL RA IMPEDANCE VALUE: 399 Ohm
MDC IDC MSMT LEADCHNL RA PACING THRESHOLD AMPLITUDE: 0.75 V
MDC IDC MSMT LEADCHNL RA PACING THRESHOLD PULSEWIDTH: 0.4 ms
MDC IDC MSMT LEADCHNL RA SENSING INTR AMPL: 2.375 mV
MDC IDC MSMT LEADCHNL RV IMPEDANCE VALUE: 532 Ohm
MDC IDC MSMT LEADCHNL RV SENSING INTR AMPL: 19.75 mV
MDC IDC SESS DTM: 20150528084227
MDC IDC SET LEADCHNL RA PACING AMPLITUDE: 2 V
MDC IDC SET LEADCHNL RV SENSING SENSITIVITY: 0.3 mV
MDC IDC SET ZONE DETECTION INTERVAL: 240 ms
MDC IDC SET ZONE DETECTION INTERVAL: 270 ms
MDC IDC SET ZONE DETECTION INTERVAL: 320 ms
MDC IDC STAT BRADY AP VS PERCENT: 0.01 %
MDC IDC STAT BRADY AS VP PERCENT: 98.83 %
MDC IDC STAT BRADY AS VS PERCENT: 0.27 %
MDC IDC STAT BRADY RA PERCENT PACED: 0.9 %
MDC IDC STAT BRADY RV PERCENT PACED: 99.73 %
Zone Setting Detection Interval: 350 ms
Zone Setting Detection Interval: 400 ms

## 2013-07-25 NOTE — Telephone Encounter (Signed)
The patient called today inquiring about his last transmission. He was due on 06/30/13 for an ICM transmission, but this did not come through until 07/01/13 (full transmission this month). I apologized I had missed calling him last week. He was very understanding. I did review his ICM readings with him from May, which were stable. He has been doing well symptom wise. His baseline weight has shifted to 160 lbs and has been holding steady for him. I advised I will follow up with him next month. He is scheduled for a transmission on 08/25/13.

## 2013-08-02 ENCOUNTER — Encounter: Payer: Self-pay | Admitting: Cardiology

## 2013-08-25 ENCOUNTER — Encounter: Payer: Medicare Other | Admitting: *Deleted

## 2013-08-29 ENCOUNTER — Telehealth: Payer: Self-pay | Admitting: *Deleted

## 2013-08-29 NOTE — Telephone Encounter (Signed)
ICM transmission received on 7/23. I left a message for the patient to call last week on his home and cell #'s. I left messages at both #'s again today to please call.

## 2013-09-29 ENCOUNTER — Telehealth: Payer: Self-pay | Admitting: *Deleted

## 2013-09-29 NOTE — Telephone Encounter (Signed)
The patient called today stating he was unavailable when his ICM transmission came in last month. He sent one in on 09/23/13 and I looked at this today. Optivol readings had been elevated over the last month. The patient states has been drinking a lot of milk this month and he eats an occasional country ham biscuit. He does not eat canned vegetables or soups. He uses a salt substitute. He states his baseline weight has been about 160 lbs recently. However, he was up to 164 lbs yesterday. With recent elevation in his optivol readings and weight increase. I have instructed him to take an additional 20 mg of lasix for the next 3 days. He is due for a remote on 10/04/13 (full transmission) for Dr. Rayann Heman. He will re-send a transmission at that time and I have advised I will call him to reassess his fluids. He is agreeable.

## 2013-10-04 ENCOUNTER — Ambulatory Visit (INDEPENDENT_AMBULATORY_CARE_PROVIDER_SITE_OTHER): Payer: Medicare Other | Admitting: *Deleted

## 2013-10-04 ENCOUNTER — Encounter: Payer: Self-pay | Admitting: Internal Medicine

## 2013-10-04 ENCOUNTER — Telehealth: Payer: Self-pay | Admitting: Cardiology

## 2013-10-04 DIAGNOSIS — I5022 Chronic systolic (congestive) heart failure: Secondary | ICD-10-CM

## 2013-10-04 DIAGNOSIS — Z95810 Presence of automatic (implantable) cardiac defibrillator: Secondary | ICD-10-CM | POA: Diagnosis not present

## 2013-10-04 DIAGNOSIS — I428 Other cardiomyopathies: Secondary | ICD-10-CM | POA: Diagnosis not present

## 2013-10-04 DIAGNOSIS — I429 Cardiomyopathy, unspecified: Secondary | ICD-10-CM

## 2013-10-04 LAB — MDC_IDC_ENUM_SESS_TYPE_REMOTE
Battery Voltage: 3.12 V
Brady Statistic AP VP Percent: 0.26 %
Brady Statistic AS VP Percent: 99.62 %
Brady Statistic RA Percent Paced: 0.27 %
Date Time Interrogation Session: 20150901160011
HighPow Impedance: 247 Ohm
HighPow Impedance: 456 Ohm
HighPow Impedance: 52 Ohm
HighPow Impedance: 66 Ohm
Lead Channel Impedance Value: 399 Ohm
Lead Channel Pacing Threshold Amplitude: 1.125 V
Lead Channel Pacing Threshold Pulse Width: 0.4 ms
Lead Channel Sensing Intrinsic Amplitude: 2.25 mV
Lead Channel Sensing Intrinsic Amplitude: 2.25 mV
Lead Channel Setting Pacing Amplitude: 2 V
Lead Channel Setting Pacing Amplitude: 2 V
MDC IDC MSMT LEADCHNL LV IMPEDANCE VALUE: 532 Ohm
MDC IDC MSMT LEADCHNL LV IMPEDANCE VALUE: 627 Ohm
MDC IDC MSMT LEADCHNL LV IMPEDANCE VALUE: 950 Ohm
MDC IDC MSMT LEADCHNL LV PACING THRESHOLD AMPLITUDE: 0.75 V
MDC IDC MSMT LEADCHNL LV PACING THRESHOLD PULSEWIDTH: 0.4 ms
MDC IDC MSMT LEADCHNL RA PACING THRESHOLD AMPLITUDE: 0.75 V
MDC IDC MSMT LEADCHNL RA PACING THRESHOLD PULSEWIDTH: 0.4 ms
MDC IDC MSMT LEADCHNL RV IMPEDANCE VALUE: 570 Ohm
MDC IDC MSMT LEADCHNL RV SENSING INTR AMPL: 21.5 mV
MDC IDC MSMT LEADCHNL RV SENSING INTR AMPL: 21.5 mV
MDC IDC SET LEADCHNL LV PACING PULSEWIDTH: 0.4 ms
MDC IDC SET LEADCHNL RV PACING AMPLITUDE: 2.5 V
MDC IDC SET LEADCHNL RV PACING PULSEWIDTH: 0.4 ms
MDC IDC SET LEADCHNL RV SENSING SENSITIVITY: 0.3 mV
MDC IDC SET ZONE DETECTION INTERVAL: 240 ms
MDC IDC SET ZONE DETECTION INTERVAL: 400 ms
MDC IDC STAT BRADY AP VS PERCENT: 0.01 %
MDC IDC STAT BRADY AS VS PERCENT: 0.12 %
MDC IDC STAT BRADY RV PERCENT PACED: 99.88 %
Zone Setting Detection Interval: 270 ms
Zone Setting Detection Interval: 320 ms
Zone Setting Detection Interval: 350 ms

## 2013-10-04 NOTE — Progress Notes (Signed)
Remote ICD transmission.   

## 2013-10-04 NOTE — Telephone Encounter (Signed)
LMOVM reminding pt to send remote transmission.   

## 2013-10-06 ENCOUNTER — Telehealth: Payer: Self-pay | Admitting: *Deleted

## 2013-10-06 ENCOUNTER — Encounter: Payer: Self-pay | Admitting: *Deleted

## 2013-10-06 NOTE — Telephone Encounter (Signed)
I spoke with the patient. 

## 2013-10-06 NOTE — Telephone Encounter (Signed)
ICM transmission received. I left a message for the patient to call at his home and cell #'s.

## 2013-10-06 NOTE — Progress Notes (Addendum)
EPIC Encounter for ICM Monitoring  Patient Name: Paul [name]. is a 73 y.o. male Date: 10/06/2013 Primary Care Physican: Caralyn Guile, DO Primary Cardiologist: Ron Parker Electrophysiologist: Allred Dry Weight: 160 lbs  Bi-V pacing: 99.7%       In the past month, have you:  1. Gained more than 2 pounds in a day or more than 5 pounds in a week? No. Most recent weight has been 158 lbs.  2. Had changes in your medications (with verification of current medications)? Yes. Transmission was sent in last week (early). Optivol readings were elevated. The patient was instructed to take an extra 20 mg of lasix x 3 days. He did this for 2 days as Sunday was the third day and he was away from home most of the day.   3. Had more shortness of breath than is usual for you? no  4. Limited your activity because of shortness of breath? no  5. Not been able to sleep because of shortness of breath? no  6. Had increased swelling in your feet or ankles? no  7. Had symptoms of dehydration (dizziness, dry mouth, increased thirst, decreased urine output) no  8. Had changes in sodium restriction? no  9. Been compliant with medication? Yes  ** The patient does report that he was woken up Saturday night by his dog barking on the front porch. He thought he was tangled up in something. He did just dizzy walking to the front door and lost his balance and broke a pane of glass on the front door as he fell into it. There was a curtain over the window, so he was not injured. He recovered within minutes from his dizziness. He was aware that his BP probably dropped with getting up quickly. **   ICM trend:   Follow-up plan: ICM clinic phone appointment: 11/07/13  Copy of note sent to patient's primary care physician, primary cardiologist, and device following physician.  Alvis Lemmings, RN, BSN 10/06/2013 5:15 PM

## 2013-10-06 NOTE — Telephone Encounter (Signed)
Patient returned you call.

## 2013-10-06 NOTE — Addendum Note (Signed)
Addended by: Alvis Lemmings C on: 10/06/2013 05:23 PM   Modules accepted: Level of Service

## 2013-10-12 ENCOUNTER — Encounter: Payer: Self-pay | Admitting: Cardiology

## 2013-11-07 ENCOUNTER — Telehealth: Payer: Self-pay | Admitting: Cardiology

## 2013-11-07 ENCOUNTER — Ambulatory Visit (INDEPENDENT_AMBULATORY_CARE_PROVIDER_SITE_OTHER): Payer: Medicare Other | Admitting: *Deleted

## 2013-11-07 ENCOUNTER — Encounter: Payer: Self-pay | Admitting: *Deleted

## 2013-11-07 DIAGNOSIS — I5022 Chronic systolic (congestive) heart failure: Secondary | ICD-10-CM | POA: Diagnosis not present

## 2013-11-07 DIAGNOSIS — Z95810 Presence of automatic (implantable) cardiac defibrillator: Secondary | ICD-10-CM

## 2013-11-07 NOTE — Progress Notes (Signed)
EPIC Encounter for ICM Monitoring  Patient Name: Paul [name]. is a 73 y.o. male Date: 11/07/2013 Primary Care Physican: Caralyn Guile, DO Primary Cardiologist: Ron Parker Electrophysiologist: Allred Dry Weight: 160 lbs  Bi-V pacing: 99.7 %       In the past month, have you:  1. Gained more than 2 pounds in a day or more than 5 pounds in a week? no  2. Had changes in your medications (with verification of current medications)? no  3. Had more shortness of breath than is usual for you? no  4. Limited your activity because of shortness of breath? no  5. Not been able to sleep because of shortness of breath? no  6. Had increased swelling in your feet or ankles? no  7. Had symptoms of dehydration (dizziness, dry mouth, increased thirst, decreased urine output) no  8. Had changes in sodium restriction? no  9. Been compliant with medication? Yes   ICM trend:   Follow-up plan: ICM clinic phone appointment: 12/08/13. The patient's corvue readings were elevated from ~ 9/19-10/2. He denies any change in symptoms or sodium intake. He has not required any additional lasix over the last few weeks. No changes made today. Will forward to MD to review.  Copy of note sent to patient's primary care physician, primary cardiologist, and device following physician.  Alvis Lemmings, RN, BSN 11/07/2013 5:34 PM

## 2013-11-07 NOTE — Telephone Encounter (Signed)
LMOVM reminding pt to send remote transmission.   

## 2013-12-08 ENCOUNTER — Encounter: Payer: Self-pay | Admitting: *Deleted

## 2013-12-08 ENCOUNTER — Ambulatory Visit (INDEPENDENT_AMBULATORY_CARE_PROVIDER_SITE_OTHER): Payer: Medicare Other | Admitting: *Deleted

## 2013-12-08 DIAGNOSIS — I5022 Chronic systolic (congestive) heart failure: Secondary | ICD-10-CM

## 2013-12-08 DIAGNOSIS — Z95810 Presence of automatic (implantable) cardiac defibrillator: Secondary | ICD-10-CM | POA: Diagnosis not present

## 2013-12-08 NOTE — Progress Notes (Signed)
EPIC Encounter for ICM Monitoring  Patient Name: Paul [name]. is a 73 y.o. male Date: 12/08/2013 Primary Care Physican: Caralyn Guile, DO Primary Cardiologist: Ron Parker Electrophysiologist: Allred Dry Weight: 159 lbs       In the past month, have you:  1. Gained more than 2 pounds in a day or more than 5 pounds in a week? no  2. Had changes in your medications (with verification of current medications)? no  3. Had more shortness of breath than is usual for you? no  4. Limited your activity because of shortness of breath? no  5. Not been able to sleep because of shortness of breath? no  6. Had increased swelling in your feet or ankles? no  7. Had symptoms of dehydration (dizziness, dry mouth, increased thirst, decreased urine output) no  8. Had changes in sodium restriction? no  9. Been compliant with medication? Yes   ICM trend:   Follow-up plan: ICM clinic phone appointment: 02/06/14. The patient has a follow up with Dr. Rayann Heman scheduled on 01/02/14.  Copy of note sent to patient's primary care physician, primary cardiologist, and device following physician.  Alvis Lemmings, RN, BSN 12/08/2013 11:56 AM

## 2014-01-02 ENCOUNTER — Ambulatory Visit (INDEPENDENT_AMBULATORY_CARE_PROVIDER_SITE_OTHER): Payer: Medicare Other | Admitting: Internal Medicine

## 2014-01-02 ENCOUNTER — Encounter: Payer: Self-pay | Admitting: Internal Medicine

## 2014-01-02 VITALS — BP 129/83 | HR 78 | Ht 67.0 in | Wt 163.0 lb

## 2014-01-02 DIAGNOSIS — I2510 Atherosclerotic heart disease of native coronary artery without angina pectoris: Secondary | ICD-10-CM

## 2014-01-02 DIAGNOSIS — F17200 Nicotine dependence, unspecified, uncomplicated: Secondary | ICD-10-CM

## 2014-01-02 DIAGNOSIS — I5022 Chronic systolic (congestive) heart failure: Secondary | ICD-10-CM

## 2014-01-02 DIAGNOSIS — I429 Cardiomyopathy, unspecified: Secondary | ICD-10-CM

## 2014-01-02 DIAGNOSIS — Z720 Tobacco use: Secondary | ICD-10-CM

## 2014-01-02 LAB — MDC_IDC_ENUM_SESS_TYPE_INCLINIC
Brady Statistic AP VP Percent: 0.41 %
Brady Statistic AP VS Percent: 0.01 %
Brady Statistic AS VP Percent: 99.42 %
Brady Statistic AS VS Percent: 0.16 %
Brady Statistic RV Percent Paced: 99.83 %
HIGH POWER IMPEDANCE MEASURED VALUE: 456 Ohm
HighPow Impedance: 209 Ohm
HighPow Impedance: 52 Ohm
HighPow Impedance: 69 Ohm
Lead Channel Impedance Value: 570 Ohm
Lead Channel Impedance Value: 646 Ohm
Lead Channel Impedance Value: 969 Ohm
Lead Channel Pacing Threshold Amplitude: 0.5 V
Lead Channel Pacing Threshold Amplitude: 1 V
Lead Channel Pacing Threshold Pulse Width: 0.4 ms
Lead Channel Sensing Intrinsic Amplitude: 2.625 mV
Lead Channel Setting Pacing Amplitude: 2 V
Lead Channel Setting Pacing Amplitude: 2 V
Lead Channel Setting Pacing Pulse Width: 0.4 ms
MDC IDC MSMT BATTERY VOLTAGE: 3.08 V
MDC IDC MSMT LEADCHNL LV PACING THRESHOLD AMPLITUDE: 0.75 V
MDC IDC MSMT LEADCHNL LV PACING THRESHOLD PULSEWIDTH: 0.4 ms
MDC IDC MSMT LEADCHNL RA IMPEDANCE VALUE: 456 Ohm
MDC IDC MSMT LEADCHNL RV IMPEDANCE VALUE: 570 Ohm
MDC IDC MSMT LEADCHNL RV PACING THRESHOLD PULSEWIDTH: 0.4 ms
MDC IDC MSMT LEADCHNL RV SENSING INTR AMPL: 20.5 mV
MDC IDC SESS DTM: 20151130115008
MDC IDC SET LEADCHNL LV PACING PULSEWIDTH: 0.4 ms
MDC IDC SET LEADCHNL RV PACING AMPLITUDE: 2.5 V
MDC IDC SET LEADCHNL RV SENSING SENSITIVITY: 0.3 mV
MDC IDC SET ZONE DETECTION INTERVAL: 400 ms
MDC IDC STAT BRADY RA PERCENT PACED: 0.42 %
Zone Setting Detection Interval: 240 ms
Zone Setting Detection Interval: 270 ms
Zone Setting Detection Interval: 320 ms
Zone Setting Detection Interval: 350 ms

## 2014-01-02 NOTE — Progress Notes (Signed)
PCP: Caralyn Guile, DO Primary Cardiologist:  Dr Brent Bulla. is a 73 y.o. male who presents today for routine electrophysiology followup.  Since his last office visit, the patient reports doing very well.  His wife died last 31-Jan-2023.  He continues to grieve. Today, he denies symptoms of palpitations, chest pain, shortness of breath,  lower extremity edema, syncope, or ICD shocks.  The patient is otherwise without complaint today.   Past Medical History  Diagnosis Date  . Cardiomyopathy   . ICD (implantable cardiac defibrillator) battery depletion     CRT-D device  . Ejection fraction     Ejection fraction 25%  /  repeat ejection fraction 50%, echo, July, 2009  . Mitral regurgitation     Severe before CRT with annular dilatation and poor coaptation of the mitral leaflets  /  mild to moderate mitral regurgitation, July, 2009, after CRT therapy  . Pulmonary hypertension     44 mmHg, echo, July, 2009  . COPD (chronic obstructive pulmonary disease)   . Smoking     Chantix cause headaches  . Lung nodule     [facility]  . Hypertension   . Dyslipidemia   . BPH (benign prostatic hyperplasia)   . Gout   . Alcohol abuse     In the past.  Stable over many years  . Migraines   . LBBB (left bundle branch block)   . CAD (coronary artery disease)     Catheterization April, 2008, single vessel disease with collateralized branch of the left circumflex  . Syncope     Probably orthostasis   Past Surgical History  Procedure Laterality Date  . Cardiac defibrillator placement  12/29/12    BiV ICD implanted, generator change 12/30/11 by Dr Rayann Heman MDT Sim Boast XT CRT-D    Current Outpatient Prescriptions  Medication Sig Dispense Refill  . aspirin 325 MG tablet Take 325 mg by mouth daily.      Marland Kitchen atorvastatin (LIPITOR) 80 MG tablet Take 80 mg by mouth daily.    . butalbital-acetaminophen-caffeine (FIORICET WITH CODEINE) 50-325-40-30 MG per capsule Take 1 capsule by mouth  every 4 (four) hours as needed.      . carvedilol (COREG) 12.5 MG tablet Take 1 tablet (12.5 mg total) by mouth 2 (two) times daily with a meal. 60 tablet 1  . Cholecalciferol 50000 UNITS TABS Take 50,000 Units by mouth once a week.    . divalproex (DEPAKOTE) 500 MG 24 hr tablet Take 1,500 mg by mouth at bedtime.      . finasteride (PROSCAR) 5 MG tablet Take 5 mg by mouth daily.      . folic acid (FOLVITE) 1 MG tablet Take 1 mg by mouth daily.      . furosemide (LASIX) 40 MG tablet Take 40 mg by mouth daily.      . hydroxypropyl methylcellulose (ISOPTO TEARS) 2.5 % ophthalmic solution 1 drop 4 (four) times daily as needed.     Marland Kitchen lisinopril (PRINIVIL,ZESTRIL) 40 MG tablet Take 1 tablet (40 mg total) by mouth daily. 30 tablet 1  . mirtazapine (REMERON) 30 MG tablet Take 30 mg by mouth at bedtime.    . Multiple Vitamin (MULTIVITAMIN) tablet Take 1 tablet by mouth daily.      . nitroGLYCERIN (NITROSTAT) 0.4 MG SL tablet Place 0.4 mg under the tongue every 5 (five) minutes as needed.      . Omega-3 Fatty Acids (FISH OIL) 1000 MG CAPS Take 1  capsule by mouth daily.    Marland Kitchen omeprazole (PRILOSEC) 20 MG capsule Take 20 mg by mouth daily.      . potassium chloride SA (K-DUR,KLOR-CON) 20 MEQ tablet Take 20 mEq by mouth daily.      Marland Kitchen tiotropium (SPIRIVA) 18 MCG inhalation capsule Place 18 mcg into inhaler and inhale every morning.      . traMADol (ULTRAM) 50 MG tablet Take 50 mg by mouth. Take 1 to 2 tablets by mouth every 8 to 12 hours as needed for pain      No current facility-administered medications for this visit.    Physical Exam: Filed Vitals:   01/02/14 1134  BP: 129/83  Pulse: 78  Height: 5\' 7"  (1.702 m)  Weight: 163 lb (73.936 kg)  SpO2: 99%    GEN- The patient is well appearing, alert and oriented x 3 today.   Head- normocephalic, atraumatic Eyes-  Sclera clear, conjunctiva pink Ears- hearing intact Oropharynx- clear Lungs- Clear to ausculation bilaterally, normal work of  breathing Chest- ICD pocket is well healed Heart- Regular rate and rhythm, no murmurs, rubs or gallops, PMI not laterally displaced GI- soft, NT, ND, + BS Extremities- no clubbing, cyanosis, or edema R arm with ecchymosis  ICD interrogation- reviewed in detail today,  See PACEART report  Assessment and Plan:  1.  Chronic systolic dysfunction euvolemic today Stable on an appropriate medical regimen Normal ICD function See Pace Art report No changes today  2. Postural dizziness Much improved  3. Tobacco Unfortunately he started smoking again with his wife's death.  He is trying to quit again.  carelink every 3 months Return in 1 year Followed by Alvis Lemmings in the [facility]  Return to see Dr Ron Parker as scheduled

## 2014-01-02 NOTE — Patient Instructions (Signed)
Your physician recommends that you schedule a follow-up appointment in: 1 year with Dr. Rayann Heman. You will receive a reminder letter in the mail in about 10 months reminding you to call and schedule your appointment. If you don't receive this letter, please contact our office. Carelink device check 04/03/14. Your physician recommends that you continue on your current medications as directed. Please refer to the Current Medication list given to you today.

## 2014-01-03 ENCOUNTER — Encounter: Payer: Self-pay | Admitting: Internal Medicine

## 2014-01-12 ENCOUNTER — Encounter (HOSPITAL_COMMUNITY): Payer: Self-pay | Admitting: Internal Medicine

## 2014-02-06 ENCOUNTER — Ambulatory Visit (INDEPENDENT_AMBULATORY_CARE_PROVIDER_SITE_OTHER): Payer: Medicare Other | Admitting: *Deleted

## 2014-02-06 DIAGNOSIS — Z95810 Presence of automatic (implantable) cardiac defibrillator: Secondary | ICD-10-CM

## 2014-02-06 DIAGNOSIS — I5022 Chronic systolic (congestive) heart failure: Secondary | ICD-10-CM

## 2014-02-07 NOTE — Progress Notes (Signed)
EPIC Encounter for ICM Monitoring  Patient Name: Paul [name]. is a 74 y.o. male Date: 02/07/2014 Primary Care Physican: Caralyn Guile, DO Primary Cardiologist: Ron Parker Electrophysiologist: Allred Dry Weight: 159 lbs  Bi-V pacing: 99.6%       In the past month, have you:  1. Gained more than 2 pounds in a day or more than 5 pounds in a week? His weight has trended up to 162 lbs over the holidays.  2. Had changes in your medications (with verification of current medications)? no  3. Had more shortness of breath than is usual for you? no  4. Limited your activity because of shortness of breath? no  5. Not been able to sleep because of shortness of breath? no  6. Had increased swelling in your feet or ankles? no  7. Had symptoms of dehydration (dizziness, dry mouth, increased thirst, decreased urine output) no  8. Had changes in sodium restriction? Yes with the Christmas holidays.  9. Been compliant with medication? Yes   ICM trend:   Follow-up plan: ICM clinic phone appointment: 03/13/14. The patient's impedence has been below baseline from ~ 12/21-present. He does admit to some dietary indiscretion over the Christmas holiday. He has been symptom free despite a slight increase in his weight. The patient typically takes lasix 40 mg once daily. I have instructed him to take an extra lasix 40 mg x 3 days and then resume his normal dosing. He voices understanding.   Copy of note sent to patient's primary care physician, primary cardiologist, and device following physician.  Alvis Lemmings, RN, BSN 02/07/2014 4:57 PM

## 2014-03-13 ENCOUNTER — Ambulatory Visit (INDEPENDENT_AMBULATORY_CARE_PROVIDER_SITE_OTHER): Payer: Medicare Other | Admitting: *Deleted

## 2014-03-13 ENCOUNTER — Telehealth: Payer: Self-pay | Admitting: Cardiology

## 2014-03-13 DIAGNOSIS — Z95810 Presence of automatic (implantable) cardiac defibrillator: Secondary | ICD-10-CM

## 2014-03-13 DIAGNOSIS — I5022 Chronic systolic (congestive) heart failure: Secondary | ICD-10-CM

## 2014-03-13 NOTE — Telephone Encounter (Signed)
Spoke with pt and reminded pt of remote transmission that is due today. Pt verbalized understanding.   

## 2014-03-16 ENCOUNTER — Encounter: Payer: Self-pay | Admitting: *Deleted

## 2014-03-16 NOTE — Progress Notes (Signed)
EPIC Encounter for ICM Monitoring  Patient Name: Paul [name]. is a 74 y.o. male Date: 03/16/2014 Primary Care Physican: Caralyn Guile, DO Primary Cardiologist: Ron Parker Electrophysiologist: Allred Dry Weight: 159 lbs  Bi-V pacing: 99.7%       In the past month, have you:  1. Gained more than 2 pounds in a day or more than 5 pounds in a week? no  2. Had changes in your medications (with verification of current medications)? no  3. Had more shortness of breath than is usual for you? no  4. Limited your activity because of shortness of breath? no  5. Not been able to sleep because of shortness of breath? no  6. Had increased swelling in your feet or ankles? no  7. Had symptoms of dehydration (dizziness, dry mouth, increased thirst, decreased urine output) no  8. Had changes in sodium restriction? no  9. Been compliant with medication? Yes   ICM trend:   Follow-up plan: ICM clinic phone appointment: 04/17/14. The patient reports no change in his symptoms this month. He did have a virus of some sort for about 3 weeks- no fever, but + nausea/ no energy. He has recovered from this. No changes made today.  Copy of note sent to patient's primary care physician, primary cardiologist, and device following physician.  Alvis Lemmings, RN, BSN 03/16/2014 4:33 PM

## 2014-04-17 ENCOUNTER — Telehealth: Payer: Self-pay | Admitting: Cardiology

## 2014-04-17 ENCOUNTER — Ambulatory Visit (INDEPENDENT_AMBULATORY_CARE_PROVIDER_SITE_OTHER): Payer: Medicare Other | Admitting: *Deleted

## 2014-04-17 DIAGNOSIS — Z95810 Presence of automatic (implantable) cardiac defibrillator: Secondary | ICD-10-CM

## 2014-04-17 DIAGNOSIS — I429 Cardiomyopathy, unspecified: Secondary | ICD-10-CM | POA: Diagnosis not present

## 2014-04-17 DIAGNOSIS — I5022 Chronic systolic (congestive) heart failure: Secondary | ICD-10-CM | POA: Diagnosis not present

## 2014-04-17 LAB — MDC_IDC_ENUM_SESS_TYPE_REMOTE
Battery Voltage: 3.1 V
Brady Statistic AP VP Percent: 0.27 %
Brady Statistic AP VS Percent: 0.01 %
Brady Statistic AS VP Percent: 99.46 %
Brady Statistic AS VS Percent: 0.27 %
Brady Statistic RA Percent Paced: 0.28 %
Brady Statistic RV Percent Paced: 99.73 %
Date Time Interrogation Session: 20160314175202
HighPow Impedance: 437 Ohm
HighPow Impedance: 46 Ohm
HighPow Impedance: 58 Ohm
Lead Channel Impedance Value: 380 Ohm
Lead Channel Impedance Value: 532 Ohm
Lead Channel Impedance Value: 532 Ohm
Lead Channel Impedance Value: 532 Ohm
Lead Channel Impedance Value: 855 Ohm
Lead Channel Pacing Threshold Amplitude: 0.5 V
Lead Channel Pacing Threshold Amplitude: 0.625 V
Lead Channel Pacing Threshold Amplitude: 1 V
Lead Channel Pacing Threshold Pulse Width: 0.4 ms
Lead Channel Pacing Threshold Pulse Width: 0.4 ms
Lead Channel Pacing Threshold Pulse Width: 0.4 ms
Lead Channel Sensing Intrinsic Amplitude: 2.125 mV
Lead Channel Sensing Intrinsic Amplitude: 2.125 mV
Lead Channel Sensing Intrinsic Amplitude: 23.125 mV
Lead Channel Sensing Intrinsic Amplitude: 23.125 mV
Lead Channel Setting Pacing Amplitude: 2 V
Lead Channel Setting Pacing Amplitude: 2 V
Lead Channel Setting Pacing Amplitude: 2.5 V
Lead Channel Setting Pacing Pulse Width: 0.4 ms
Lead Channel Setting Pacing Pulse Width: 0.4 ms
Lead Channel Setting Sensing Sensitivity: 0.3 mV
Zone Setting Detection Interval: 240 ms
Zone Setting Detection Interval: 270 ms
Zone Setting Detection Interval: 320 ms
Zone Setting Detection Interval: 350 ms
Zone Setting Detection Interval: 400 ms

## 2014-04-17 NOTE — Progress Notes (Signed)
Remote ICD transmission.   

## 2014-04-17 NOTE — Telephone Encounter (Signed)
Spoke with pt and reminded pt of remote transmission that is due today. Pt verbalized understanding.   

## 2014-04-19 ENCOUNTER — Encounter: Payer: Self-pay | Admitting: *Deleted

## 2014-04-19 NOTE — Progress Notes (Addendum)
EPIC Encounter for ICM Monitoring  Patient Name: Paul [name]. is a 74 y.o. male Date: 04/19/2014 Primary Care Physican: Caralyn Guile, DO Primary Cardiologist: Ron Parker Electrophysiologist: Allred Dry Weight: 164 lbs  Bi-V pacing: 99.6%       In the past month, have you:  1. Gained more than 2 pounds in a day or more than 5 pounds in a week? Per the patient's report, he has had a slow progression of his weight going up from 159 lbs to currently 164 lbs.   2. Had changes in your medications (with verification of current medications)? no  3. Had more shortness of breath than is usual for you? no  4. Limited your activity because of shortness of breath? no  5. Not been able to sleep because of shortness of breath? no  6. Had increased swelling in your feet or ankles? no  7. Had symptoms of dehydration (dizziness, dry mouth, increased thirst, decreased urine output) no  8. Had changes in sodium restriction? no  9. Been compliant with medication? Yes   ICM trend:   Follow-up plan: ICM clinic phone appointment: 05/22/14.  Copy of note sent to patient's primary care physician, primary cardiologist, and device following physician.  Alvis Lemmings, RN, BSN 04/19/2014 3:38 PM

## 2014-04-19 NOTE — Addendum Note (Signed)
Addended by: Alvis Lemmings C on: 04/19/2014 03:41 PM   Modules accepted: Level of Service

## 2014-04-25 ENCOUNTER — Encounter: Payer: Self-pay | Admitting: *Deleted

## 2014-05-02 ENCOUNTER — Encounter: Payer: Self-pay | Admitting: Internal Medicine

## 2014-05-22 ENCOUNTER — Encounter: Payer: Self-pay | Admitting: *Deleted

## 2014-05-22 ENCOUNTER — Ambulatory Visit (INDEPENDENT_AMBULATORY_CARE_PROVIDER_SITE_OTHER): Payer: Medicare Other | Admitting: *Deleted

## 2014-05-22 DIAGNOSIS — Z95810 Presence of automatic (implantable) cardiac defibrillator: Secondary | ICD-10-CM

## 2014-05-22 DIAGNOSIS — I5022 Chronic systolic (congestive) heart failure: Secondary | ICD-10-CM

## 2014-05-22 NOTE — Addendum Note (Signed)
Addended by: Alvis Lemmings C on: 05/22/2014 03:34 PM   Modules accepted: Orders, Medications

## 2014-05-22 NOTE — Progress Notes (Signed)
EPIC Encounter for ICM Monitoring  Patient Name: Paul [name]. is a 74 y.o. male Date: 05/22/2014 Primary Care Physican: Caralyn Guile, DO Primary Cardiologist: Ron Parker Electrophysiologist: Allred Dry Weight: 160 lbs   Bi-V pacing: 99.5%      In the past month, have you:  1. Gained more than 2 pounds in a day or more than 5 pounds in a week? no  2. Had changes in your medications (with verification of current medications)? No. He will occasionally skip doses of his lasix if he is "on the go."  3. Had more shortness of breath than is usual for you? no  4. Limited your activity because of shortness of breath? no  5. Not been able to sleep because of shortness of breath? no  6. Had increased swelling in your feet or ankles? no  7. Had symptoms of dehydration (dizziness, dry mouth, increased thirst, decreased urine output) no  8. Had changes in sodium restriction? no  9. Been compliant with medication? Yes   ICM trend:   Follow-up plan: ICM clinic phone appointment: 06/22/14. The patient's optivol readings were elevated from ~ 3/28-4/14. He does state that he will skip doses of his lasix if he will be "on the go." This is most likely what has contributed to to his increased readings. He is back on his lasix today. His only complaint is that his BP has been up some. The highest he noticed was 150/100. He took an extra coreg the day this occurred. He is due to follow up with Dr. Ron Parker on 05/25/14 and will discuss his BP's further at that time. No other changes made today.   Copy of note sent to patient's primary care physician, primary cardiologist, and device following physician.  Alvis Lemmings, RN, BSN 05/22/2014 3:10 PM

## 2014-05-25 ENCOUNTER — Encounter: Payer: Self-pay | Admitting: Cardiology

## 2014-05-25 ENCOUNTER — Ambulatory Visit (INDEPENDENT_AMBULATORY_CARE_PROVIDER_SITE_OTHER): Payer: Medicare Other | Admitting: Cardiology

## 2014-05-25 VITALS — BP 124/78 | HR 65 | Ht 67.0 in | Wt 163.8 lb

## 2014-05-25 DIAGNOSIS — I5022 Chronic systolic (congestive) heart failure: Secondary | ICD-10-CM | POA: Diagnosis not present

## 2014-05-25 DIAGNOSIS — I2510 Atherosclerotic heart disease of native coronary artery without angina pectoris: Secondary | ICD-10-CM

## 2014-05-25 DIAGNOSIS — I10 Essential (primary) hypertension: Secondary | ICD-10-CM | POA: Diagnosis not present

## 2014-05-25 DIAGNOSIS — I429 Cardiomyopathy, unspecified: Secondary | ICD-10-CM | POA: Diagnosis not present

## 2014-05-25 NOTE — Assessment & Plan Note (Signed)
Patient had only mild single-vessel coronary disease with collateralized left circumflex by catheterization in 2008. No further workup.

## 2014-05-25 NOTE — Progress Notes (Signed)
Cardiology Office Note   Date:  05/25/2014   ID:  Paul Junker., DOB 01-May-1940, MRN [id number]  PCP:  Caralyn Guile, DO  Cardiologist:  Dola Argyle, MD   Chief Complaint  Patient presents with  . Appointment    Follow-up cardiomyopathy      History of Present Illness: Paul Hiney. is a 74 y.o. male who presents today to follow-up his cardiomyopathy. He is actually doing well. His ICD has been managed carefully. When it was interrogated recently his volume status was up during several today's of the period. This is related to times when he fails to take his diuretic on that day. He's also had some mild elevation in his blood pressure intermittently. It is stable today.    Past Medical History  Diagnosis Date  . Cardiomyopathy   . ICD (implantable cardiac defibrillator) battery depletion     CRT-D device  . Ejection fraction     Ejection fraction 25%  /  repeat ejection fraction 50%, echo, July, 2009  . Mitral regurgitation     Severe before CRT with annular dilatation and poor coaptation of the mitral leaflets  /  mild to moderate mitral regurgitation, July, 2009, after CRT therapy  . Pulmonary hypertension     44 mmHg, echo, July, 2009  . COPD (chronic obstructive pulmonary disease)   . Smoking     Chantix cause headaches  . Lung nodule     [facility]  . Hypertension   . Dyslipidemia   . BPH (benign prostatic hyperplasia)   . Gout   . Alcohol abuse     In the past.  Stable over many years  . Migraines   . LBBB (left bundle branch block)   . CAD (coronary artery disease)     Catheterization April, 2008, single vessel disease with collateralized branch of the left circumflex  . Syncope     Probably orthostasis    Past Surgical History  Procedure Laterality Date  . Cardiac defibrillator placement  12/29/12    BiV ICD implanted, generator change 12/30/11 by Dr Rayann Heman MDT Protecta XT CRT-D  . Bi-ventricular implantable cardioverter  defibrillator N/A 12/30/2011    Procedure: BI-VENTRICULAR IMPLANTABLE CARDIOVERTER DEFIBRILLATOR  (CRT-D);  Surgeon: Thompson Grayer, MD;  Location: [facility] CATH LAB;  Service: Cardiovascular;  Laterality: N/A;    Patient Active Problem List   Diagnosis Date Noted  . Chronic systolic heart failure 51/03/5850  . Cardiomyopathy   . Encounter for servicing of automatic implantable cardioverter-defibrillator (AICD) at end of battery life   . Ejection fraction   . Mitral regurgitation   . Pulmonary hypertension   . COPD (chronic obstructive pulmonary disease)   . Smoking   . Lung nodule   . Hypertension   . Dyslipidemia   . BPH (benign prostatic hyperplasia)   . Gout   . Migraines   . LBBB (left bundle branch block)   . CAD (coronary artery disease)   . Syncope       Current Outpatient Prescriptions  Medication Sig Dispense Refill  . aspirin 325 MG tablet Take 325 mg by mouth daily.      Marland Kitchen atorvastatin (LIPITOR) 80 MG tablet Take 80 mg by mouth daily.    . butalbital-acetaminophen-caffeine (FIORICET WITH CODEINE) 50-325-40-30 MG per capsule Take 1 capsule by mouth every 4 (four) hours as needed.      . carvedilol (COREG) 25 MG tablet Take 1/2 tablet (12.5 mg) by mouth twice  daily    . Cholecalciferol (VITAMIN D) 2000 UNITS tablet Take 2,000 Units by mouth daily.    . divalproex (DEPAKOTE) 500 MG 24 hr tablet Take 1,500 mg by mouth at bedtime.      . finasteride (PROSCAR) 5 MG tablet Take 5 mg by mouth daily.      . folic acid (FOLVITE) 1 MG tablet Take 1 mg by mouth daily.      . furosemide (LASIX) 40 MG tablet Take 40 mg by mouth daily.      . hydroxypropyl methylcellulose (ISOPTO TEARS) 2.5 % ophthalmic solution 1 drop 4 (four) times daily as needed.     Marland Kitchen lisinopril (PRINIVIL,ZESTRIL) 40 MG tablet Take 1 tablet (40 mg total) by mouth daily. 30 tablet 1  . mirtazapine (REMERON) 30 MG tablet Take 30 mg by mouth at bedtime.    . Multiple Vitamin (MULTIVITAMIN) tablet Take 1 tablet by mouth  daily.      . nitroGLYCERIN (NITROSTAT) 0.4 MG SL tablet Place 0.4 mg under the tongue every 5 (five) minutes as needed.      . Omega-3 Fatty Acids (FISH OIL) 1000 MG CAPS Take 1 capsule by mouth daily.    Marland Kitchen omeprazole (PRILOSEC) 20 MG capsule Take 20 mg by mouth daily.      . potassium chloride SA (K-DUR,KLOR-CON) 20 MEQ tablet Take 20 mEq by mouth daily.      Marland Kitchen tiotropium (SPIRIVA) 18 MCG inhalation capsule Place 18 mcg into inhaler and inhale every morning.      . traMADol (ULTRAM) 50 MG tablet Take 50 mg by mouth. Take 1 to 2 tablets by mouth every 8 to 12 hours as needed for pain      No current facility-administered medications for this visit.    Allergies:   Morphine sulfate and Mupirocin    Social History:  The patient  reports that he has been smoking Cigarettes.  He started smoking about 58 years ago. He has a 22.5 pack-year smoking history. He has never used smokeless tobacco. He reports that he does not drink alcohol or use illicit drugs.   Family History:  There is no significant family history of coronary disease.   ROS:  Please see the history of present illness.     Patient denies fever, chills, headache, sweats, rash, change in vision, change in hearing, chest pain, cough, nausea or vomiting, urinary symptoms. All other systems are reviewed and are negative.   PHYSICAL EXAM: VS:  BP 124/78 mmHg  Pulse 65  Ht '5\' 7"'$  (1.702 m)  Wt 163 lb 12.8 oz (74.299 kg)  BMI 25.65 kg/m2  SpO2 98% , Patient is oriented to person time and place. Affect is normal. Head is atraumatic. Sclera and conjunctiva are normal. There is no jugular venous distention. Lungs are clear. Respiratory effort is not labored. Cardiac exam reveals S1 and S2. There are no clicks or significant murmurs. The abdomen is soft. There is no peripheral edema. There are no musculoskeletal deformities. There are no skin rashes. Neurologic is grossly intact.  EKG:   EKG is done today and reviewed by me. There is  biventricular pacing.   Recent Labs: No results found for requested labs within last 365 days.    Lipid Panel No results found for: CHOL, TRIG, HDL, CHOLHDL, VLDL, LDLCALC, LDLDIRECT    Wt Readings from Last 3 Encounters:  05/25/14 163 lb 12.8 oz (74.299 kg)  01/02/14 163 lb (73.936 kg)  06/01/13 161 lb 6.4 oz (73.211  kg)      Current medicines are reviewed  The patient understands his medications.   ASSESSMENT AND PLAN:

## 2014-05-25 NOTE — Assessment & Plan Note (Signed)
He's had some episodes of increased blood pressure. This may be related to the times that he has volume overload from not taking his diuretic. Blood pressures controlled today. No change in therapy.

## 2014-05-25 NOTE — Assessment & Plan Note (Signed)
His volume status was increased when checked recently at the time of his device interrogation. He admits that on days that he goes out and about he does not take his diuretic. I made it clear to him that he needs to take his diuretic each day. If he holds it in the morning to be out of his house, he should take it when he gets home. He understands.

## 2014-05-25 NOTE — Assessment & Plan Note (Signed)
The patient has had a good response to CRT therapy. His ejection fraction improved.

## 2014-05-25 NOTE — Patient Instructions (Signed)

## 2014-06-22 ENCOUNTER — Encounter: Payer: Self-pay | Admitting: *Deleted

## 2014-06-22 ENCOUNTER — Ambulatory Visit (INDEPENDENT_AMBULATORY_CARE_PROVIDER_SITE_OTHER): Payer: Medicare Other | Admitting: *Deleted

## 2014-06-22 DIAGNOSIS — Z95810 Presence of automatic (implantable) cardiac defibrillator: Secondary | ICD-10-CM

## 2014-06-22 DIAGNOSIS — I5022 Chronic systolic (congestive) heart failure: Secondary | ICD-10-CM | POA: Diagnosis not present

## 2014-06-22 DIAGNOSIS — I429 Cardiomyopathy, unspecified: Secondary | ICD-10-CM | POA: Diagnosis not present

## 2014-06-22 NOTE — Progress Notes (Signed)
EPIC Encounter for ICM Monitoring  Patient Name: Paul [name]. is a 74 y.o. male Date: 06/22/2014 Primary Care Physican: Caralyn Guile, DO Primary Cardiologist: Ron Parker Electrophysiologist: Allred Dry Weight: 162 lb   Bi-V pacing: 99.9%      In the past month, have you:  1. Gained more than 2 pounds in a day or more than 5 pounds in a week? no  2. Had changes in your medications (with verification of current medications)? no  3. Had more shortness of breath than is usual for you? No. He does have some typically have some intermittent SOB with exertion.  4. Limited your activity because of shortness of breath? no  5. Not been able to sleep because of shortness of breath? no  6. Had increased swelling in your feet or ankles? No. He did have an episode of dizziness, which he refers to as vertigo, about 2-3 weeks ago. He did have a fall at that time and feels that he may have sprained his ankle at the time. He did have some swelling to the site, but this is much improved for him.   7. Had symptoms of dehydration (dizziness, dry mouth, increased thirst, decreased urine output) no  8. Had changes in sodium restriction? no  9. Been compliant with medication? Yes   ICM trend:   Follow-up plan: ICM clinic phone appointment: 07/24/14. The patient's optivol trends have been very stable over the last month. He saw Dr. Ron Parker in the office 05/25/14 and was instructed to take his lasix when he gets home if he has been out earlier in the day and not skip his doses. He has been compliant with this. No changes made today.   Copy of note sent to patient's primary care physician, primary cardiologist, and device following physician.  Alvis Lemmings, RN, BSN 06/22/2014 3:31 PM

## 2014-07-24 ENCOUNTER — Ambulatory Visit (INDEPENDENT_AMBULATORY_CARE_PROVIDER_SITE_OTHER): Payer: Medicare Other | Admitting: *Deleted

## 2014-07-24 ENCOUNTER — Telehealth: Payer: Self-pay | Admitting: Internal Medicine

## 2014-07-24 ENCOUNTER — Encounter: Payer: Self-pay | Admitting: *Deleted

## 2014-07-24 DIAGNOSIS — Z95810 Presence of automatic (implantable) cardiac defibrillator: Secondary | ICD-10-CM | POA: Diagnosis not present

## 2014-07-24 DIAGNOSIS — I5022 Chronic systolic (congestive) heart failure: Secondary | ICD-10-CM

## 2014-07-24 DIAGNOSIS — I429 Cardiomyopathy, unspecified: Secondary | ICD-10-CM

## 2014-07-24 NOTE — Telephone Encounter (Signed)
New Message  Pt wanted to confirm his remote transmisssion. Please call back and discuss.

## 2014-07-24 NOTE — Progress Notes (Signed)
Remote ICD transmission.   

## 2014-07-24 NOTE — Progress Notes (Addendum)
EPIC Encounter for ICM Monitoring  Patient Name: Paul [name]. is a 74 y.o. male Date: 07/24/2014 Primary Care Physican: Caralyn Guile, DO Primary Cardiologist: Ron Parker Electrophysiologist: Allred Dry Weight: 162 lbs   Bi-V pacing: 99.9%      In the past month, have you:  1. Gained more than 2 pounds in a day or more than 5 pounds in a week? no  2. Had changes in your medications (with verification of current medications)? no  3. Had more shortness of breath than is usual for you? no  4. Limited your activity because of shortness of breath? no  5. Not been able to sleep because of shortness of breath? no  6. Had increased swelling in your feet or ankles? no  7. Had symptoms of dehydration (dizziness, dry mouth, increased thirst, decreased urine output) no  8. Had changes in sodium restriction? no  9. Been compliant with medication? Yes   ICM trend:   Follow-up plan: ICM clinic phone appointment: 08/24/14. No changes made today.   Copy of note sent to patient's primary care physician, primary cardiologist, and device following physician.  Alvis Lemmings, RN, BSN 07/24/2014 5:30 PM

## 2014-07-24 NOTE — Telephone Encounter (Signed)
Informed pt that transmission was received. Pt verbalized understanding.

## 2014-07-24 NOTE — Addendum Note (Signed)
Addended by: Alvis Lemmings C on: 07/24/2014 05:34 PM   Modules accepted: Level of Service

## 2014-07-25 LAB — CUP PACEART REMOTE DEVICE CHECK
Battery Voltage: 3.07 V
Brady Statistic AP VS Percent: 0.01 %
Brady Statistic RA Percent Paced: 0.07 %
HIGH POWER IMPEDANCE MEASURED VALUE: 47 Ohm
HIGH POWER IMPEDANCE MEASURED VALUE: 494 Ohm
HIGH POWER IMPEDANCE MEASURED VALUE: 58 Ohm
Lead Channel Impedance Value: 589 Ohm
Lead Channel Impedance Value: 969 Ohm
Lead Channel Pacing Threshold Amplitude: 0.875 V
Lead Channel Pacing Threshold Amplitude: 1 V
Lead Channel Pacing Threshold Pulse Width: 0.4 ms
Lead Channel Pacing Threshold Pulse Width: 0.4 ms
Lead Channel Pacing Threshold Pulse Width: 0.4 ms
Lead Channel Setting Pacing Pulse Width: 0.4 ms
MDC IDC MSMT LEADCHNL LV IMPEDANCE VALUE: 570 Ohm
MDC IDC MSMT LEADCHNL LV IMPEDANCE VALUE: 589 Ohm
MDC IDC MSMT LEADCHNL RA IMPEDANCE VALUE: 399 Ohm
MDC IDC MSMT LEADCHNL RA PACING THRESHOLD AMPLITUDE: 0.5 V
MDC IDC MSMT LEADCHNL RA SENSING INTR AMPL: 1.875 mV
MDC IDC MSMT LEADCHNL RV SENSING INTR AMPL: 23.125 mV
MDC IDC SESS DTM: 20160620142059
MDC IDC SET LEADCHNL LV PACING AMPLITUDE: 2 V
MDC IDC SET LEADCHNL LV PACING PULSEWIDTH: 0.4 ms
MDC IDC SET LEADCHNL RA PACING AMPLITUDE: 2 V
MDC IDC SET LEADCHNL RV PACING AMPLITUDE: 2.5 V
MDC IDC SET LEADCHNL RV SENSING SENSITIVITY: 0.3 mV
MDC IDC SET ZONE DETECTION INTERVAL: 320 ms
MDC IDC SET ZONE DETECTION INTERVAL: 400 ms
MDC IDC STAT BRADY AP VP PERCENT: 0.06 %
MDC IDC STAT BRADY AS VP PERCENT: 99.92 %
MDC IDC STAT BRADY AS VS PERCENT: 0.02 %
MDC IDC STAT BRADY RV PERCENT PACED: 99.98 %
Zone Setting Detection Interval: 240 ms
Zone Setting Detection Interval: 270 ms
Zone Setting Detection Interval: 350 ms

## 2014-07-27 ENCOUNTER — Encounter: Payer: Self-pay | Admitting: Cardiology

## 2014-08-01 ENCOUNTER — Telehealth: Payer: Self-pay | Admitting: Internal Medicine

## 2014-08-01 NOTE — Telephone Encounter (Deleted)
Error

## 2014-08-02 ENCOUNTER — Encounter: Payer: Self-pay | Admitting: Internal Medicine

## 2014-08-02 NOTE — Telephone Encounter (Signed)
New message     Pt calling to see if we received device transmission.  Please advise

## 2014-08-02 NOTE — Telephone Encounter (Signed)
Informed pt that transmission was received. He verbalized understanding.

## 2014-08-24 ENCOUNTER — Ambulatory Visit (INDEPENDENT_AMBULATORY_CARE_PROVIDER_SITE_OTHER): Payer: Medicare Other | Admitting: *Deleted

## 2014-08-24 DIAGNOSIS — Z95810 Presence of automatic (implantable) cardiac defibrillator: Secondary | ICD-10-CM | POA: Diagnosis not present

## 2014-08-24 DIAGNOSIS — I5022 Chronic systolic (congestive) heart failure: Secondary | ICD-10-CM | POA: Diagnosis not present

## 2014-08-25 ENCOUNTER — Telehealth: Payer: Self-pay | Admitting: Internal Medicine

## 2014-08-25 ENCOUNTER — Encounter: Payer: Self-pay | Admitting: *Deleted

## 2014-08-25 NOTE — Telephone Encounter (Signed)
New message    Pt calling to see if we received transmission.  Please call to discuss

## 2014-08-25 NOTE — Progress Notes (Signed)
EPIC Encounter for ICM Monitoring  Patient Name: Paul [name]. is a 74 y.o. male Date: 08/25/2014 Primary Care Physican: Caralyn Guile, DO Primary Cardiologist: Ron Parker Electrophysiologist: Allred Dry Weight: 165 lbs  Bi-V pacing: 100%       In the past month, have you:  1. Gained more than 2 pounds in a day or more than 5 pounds in a week? Weight has slowly increased from 162 lbs to 165 lbs over the last few months.  2. Had changes in your medications (with verification of current medications)? Vitamin D3 has been increased to 5000 units daily and he has been started on gabapentin 300 mg at bedtime.  3. Had more shortness of breath than is usual for you? no  4. Limited your activity because of shortness of breath? no  5. Not been able to sleep because of shortness of breath? no  6. Had increased swelling in your feet or ankles? no  7. Had symptoms of dehydration (dizziness, dry mouth, increased thirst, decreased urine output) no  8. Had changes in sodium restriction? no  9. Been compliant with medication? Yes   ICM trend:   Follow-up plan: ICM clinic phone appointment: 09/25/14. No changes made today.  Copy of note sent to patient's primary care physician, primary cardiologist, and device following physician.  Alvis Lemmings, RN, BSN 08/25/2014 2:42 PM

## 2014-08-25 NOTE — Telephone Encounter (Signed)
I spoke with the patient and made aware transmission received.

## 2014-08-30 ENCOUNTER — Emergency Department (HOSPITAL_COMMUNITY): Payer: Medicare Other

## 2014-08-30 ENCOUNTER — Inpatient Hospital Stay (HOSPITAL_COMMUNITY)
Admission: EM | Admit: 2014-08-30 | Discharge: 2014-09-03 | DRG: 493 | Disposition: A | Discharge status: Skilled Nursing Facility | Payer: Medicare Other | Attending: Internal Medicine | Admitting: Internal Medicine

## 2014-08-30 ENCOUNTER — Encounter (HOSPITAL_COMMUNITY): Payer: Self-pay | Admitting: Emergency Medicine

## 2014-08-30 DIAGNOSIS — S9304XA Dislocation of right ankle joint, initial encounter: Secondary | ICD-10-CM | POA: Diagnosis present

## 2014-08-30 DIAGNOSIS — I5022 Chronic systolic (congestive) heart failure: Secondary | ICD-10-CM | POA: Diagnosis not present

## 2014-08-30 DIAGNOSIS — E860 Dehydration: Secondary | ICD-10-CM | POA: Diagnosis present

## 2014-08-30 DIAGNOSIS — I255 Ischemic cardiomyopathy: Secondary | ICD-10-CM | POA: Diagnosis present

## 2014-08-30 DIAGNOSIS — S82301A Unspecified fracture of lower end of right tibia, initial encounter for closed fracture: Secondary | ICD-10-CM | POA: Diagnosis not present

## 2014-08-30 DIAGNOSIS — S82891D Other fracture of right lower leg, subsequent encounter for closed fracture with routine healing: Secondary | ICD-10-CM

## 2014-08-30 DIAGNOSIS — E785 Hyperlipidemia, unspecified: Secondary | ICD-10-CM | POA: Diagnosis present

## 2014-08-30 DIAGNOSIS — M109 Gout, unspecified: Secondary | ICD-10-CM | POA: Diagnosis present

## 2014-08-30 DIAGNOSIS — W1830XA Fall on same level, unspecified, initial encounter: Secondary | ICD-10-CM | POA: Diagnosis present

## 2014-08-30 DIAGNOSIS — I447 Left bundle-branch block, unspecified: Secondary | ICD-10-CM | POA: Diagnosis present

## 2014-08-30 DIAGNOSIS — Z419 Encounter for procedure for purposes other than remedying health state, unspecified: Secondary | ICD-10-CM

## 2014-08-30 DIAGNOSIS — I340 Nonrheumatic mitral (valve) insufficiency: Secondary | ICD-10-CM | POA: Diagnosis present

## 2014-08-30 DIAGNOSIS — Z95810 Presence of automatic (implantable) cardiac defibrillator: Secondary | ICD-10-CM | POA: Diagnosis not present

## 2014-08-30 DIAGNOSIS — R279 Unspecified lack of coordination: Secondary | ICD-10-CM | POA: Diagnosis not present

## 2014-08-30 DIAGNOSIS — Z7982 Long term (current) use of aspirin: Secondary | ICD-10-CM | POA: Diagnosis not present

## 2014-08-30 DIAGNOSIS — S82844D Nondisplaced bimalleolar fracture of right lower leg, subsequent encounter for closed fracture with routine healing: Secondary | ICD-10-CM | POA: Diagnosis not present

## 2014-08-30 DIAGNOSIS — N179 Acute kidney failure, unspecified: Secondary | ICD-10-CM | POA: Diagnosis not present

## 2014-08-30 DIAGNOSIS — N400 Enlarged prostate without lower urinary tract symptoms: Secondary | ICD-10-CM | POA: Diagnosis present

## 2014-08-30 DIAGNOSIS — I509 Heart failure, unspecified: Secondary | ICD-10-CM | POA: Diagnosis not present

## 2014-08-30 DIAGNOSIS — I10 Essential (primary) hypertension: Secondary | ICD-10-CM | POA: Diagnosis not present

## 2014-08-30 DIAGNOSIS — S82832A Other fracture of upper and lower end of left fibula, initial encounter for closed fracture: Secondary | ICD-10-CM | POA: Diagnosis not present

## 2014-08-30 DIAGNOSIS — F17200 Nicotine dependence, unspecified, uncomplicated: Secondary | ICD-10-CM | POA: Diagnosis present

## 2014-08-30 DIAGNOSIS — I272 Other secondary pulmonary hypertension: Secondary | ICD-10-CM | POA: Diagnosis present

## 2014-08-30 DIAGNOSIS — S8263XA Displaced fracture of lateral malleolus of unspecified fibula, initial encounter for closed fracture: Secondary | ICD-10-CM | POA: Diagnosis not present

## 2014-08-30 DIAGNOSIS — S82843A Displaced bimalleolar fracture of unspecified lower leg, initial encounter for closed fracture: Secondary | ICD-10-CM | POA: Diagnosis present

## 2014-08-30 DIAGNOSIS — I9589 Other hypotension: Secondary | ICD-10-CM | POA: Diagnosis present

## 2014-08-30 DIAGNOSIS — S82831D Other fracture of upper and lower end of right fibula, subsequent encounter for closed fracture with routine healing: Secondary | ICD-10-CM | POA: Diagnosis not present

## 2014-08-30 DIAGNOSIS — J449 Chronic obstructive pulmonary disease, unspecified: Secondary | ICD-10-CM | POA: Diagnosis present

## 2014-08-30 DIAGNOSIS — S82841D Displaced bimalleolar fracture of right lower leg, subsequent encounter for closed fracture with routine healing: Secondary | ICD-10-CM | POA: Diagnosis not present

## 2014-08-30 DIAGNOSIS — S8261XA Displaced fracture of lateral malleolus of right fibula, initial encounter for closed fracture: Secondary | ICD-10-CM | POA: Diagnosis not present

## 2014-08-30 DIAGNOSIS — S82899A Other fracture of unspecified lower leg, initial encounter for closed fracture: Secondary | ICD-10-CM | POA: Diagnosis present

## 2014-08-30 DIAGNOSIS — F17210 Nicotine dependence, cigarettes, uncomplicated: Secondary | ICD-10-CM | POA: Diagnosis present

## 2014-08-30 DIAGNOSIS — S8261XB Displaced fracture of lateral malleolus of right fibula, initial encounter for open fracture type I or II: Secondary | ICD-10-CM | POA: Diagnosis not present

## 2014-08-30 DIAGNOSIS — R296 Repeated falls: Secondary | ICD-10-CM | POA: Diagnosis not present

## 2014-08-30 DIAGNOSIS — IMO0001 Reserved for inherently not codable concepts without codable children: Secondary | ICD-10-CM | POA: Diagnosis present

## 2014-08-30 DIAGNOSIS — Z885 Allergy status to narcotic agent status: Secondary | ICD-10-CM

## 2014-08-30 DIAGNOSIS — G43909 Migraine, unspecified, not intractable, without status migrainosus: Secondary | ICD-10-CM | POA: Diagnosis present

## 2014-08-30 DIAGNOSIS — I2510 Atherosclerotic heart disease of native coronary artery without angina pectoris: Secondary | ICD-10-CM | POA: Diagnosis present

## 2014-08-30 DIAGNOSIS — Z79899 Other long term (current) drug therapy: Secondary | ICD-10-CM | POA: Diagnosis not present

## 2014-08-30 DIAGNOSIS — R911 Solitary pulmonary nodule: Secondary | ICD-10-CM | POA: Diagnosis present

## 2014-08-30 DIAGNOSIS — I959 Hypotension, unspecified: Secondary | ICD-10-CM | POA: Diagnosis not present

## 2014-08-30 DIAGNOSIS — Z888 Allergy status to other drugs, medicaments and biological substances status: Secondary | ICD-10-CM | POA: Diagnosis not present

## 2014-08-30 DIAGNOSIS — S93421A Sprain of deltoid ligament of right ankle, initial encounter: Secondary | ICD-10-CM | POA: Diagnosis not present

## 2014-08-30 DIAGNOSIS — W010XXD Fall on same level from slipping, tripping and stumbling without subsequent striking against object, subsequent encounter: Secondary | ICD-10-CM | POA: Diagnosis not present

## 2014-08-30 DIAGNOSIS — Z9989 Dependence on other enabling machines and devices: Secondary | ICD-10-CM | POA: Diagnosis not present

## 2014-08-30 DIAGNOSIS — W19XXXA Unspecified fall, initial encounter: Secondary | ICD-10-CM | POA: Diagnosis not present

## 2014-08-30 DIAGNOSIS — S82841A Displaced bimalleolar fracture of right lower leg, initial encounter for closed fracture: Secondary | ICD-10-CM

## 2014-08-30 DIAGNOSIS — G8918 Other acute postprocedural pain: Secondary | ICD-10-CM | POA: Diagnosis not present

## 2014-08-30 LAB — CBC WITH DIFFERENTIAL/PLATELET
BASOS PCT: 0 % (ref 0–1)
Basophils Absolute: 0 10*3/uL (ref 0.0–0.1)
EOS ABS: 0.1 10*3/uL (ref 0.0–0.7)
EOS PCT: 1 % (ref 0–5)
HCT: 34.8 % — ABNORMAL LOW (ref 39.0–52.0)
HEMOGLOBIN: 12.1 g/dL — AB (ref 13.0–17.0)
LYMPHS PCT: 19 % (ref 12–46)
Lymphs Abs: 2.1 10*3/uL (ref 0.7–4.0)
MCH: 30.8 pg (ref 26.0–34.0)
MCHC: 34.8 g/dL (ref 30.0–36.0)
MCV: 88.5 fL (ref 78.0–100.0)
MONO ABS: 0.7 10*3/uL (ref 0.1–1.0)
Monocytes Relative: 6 % (ref 3–12)
NEUTROS ABS: 8.2 10*3/uL — AB (ref 1.7–7.7)
NEUTROS PCT: 74 % (ref 43–77)
PLATELETS: 228 10*3/uL (ref 150–400)
RBC: 3.93 MIL/uL — ABNORMAL LOW (ref 4.22–5.81)
RDW: 13.6 % (ref 11.5–15.5)
WBC: 11.2 10*3/uL — AB (ref 4.0–10.5)

## 2014-08-30 LAB — BASIC METABOLIC PANEL
ANION GAP: 13 (ref 5–15)
BUN: 50 mg/dL — ABNORMAL HIGH (ref 6–20)
CHLORIDE: 102 mmol/L (ref 101–111)
CO2: 21 mmol/L — AB (ref 22–32)
Calcium: 9 mg/dL (ref 8.9–10.3)
Creatinine, Ser: 3.29 mg/dL — ABNORMAL HIGH (ref 0.61–1.24)
GFR calc Af Amer: 20 mL/min — ABNORMAL LOW (ref >60)
GFR, EST NON AFRICAN AMERICAN: 17 mL/min — AB (ref >60)
Glucose, Bld: 103 mg/dL — ABNORMAL HIGH (ref 65–99)
Potassium: 5 mmol/L (ref 3.5–5.1)
Sodium: 136 mmol/L (ref 135–145)

## 2014-08-30 LAB — BRAIN NATRIURETIC PEPTIDE: B Natriuretic Peptide: 19 pg/mL (ref 0.0–100.0)

## 2014-08-30 LAB — TROPONIN I

## 2014-08-30 MED ORDER — FENTANYL CITRATE (PF) 100 MCG/2ML IJ SOLN
50.0000 ug | Freq: Once | INTRAMUSCULAR | Status: AC
  Administered 2014-08-30: 50 ug via INTRAVENOUS
  Filled 2014-08-30: qty 2

## 2014-08-30 MED ORDER — SODIUM CHLORIDE 0.9 % IV BOLUS (SEPSIS)
1000.0000 mL | Freq: Once | INTRAVENOUS | Status: AC
  Administered 2014-08-30: 1000 mL via INTRAVENOUS

## 2014-08-30 NOTE — ED Notes (Signed)
EDP aware of pt's blood pressure. EDP in to assess pt.

## 2014-08-30 NOTE — ED Notes (Signed)
Patient states "I have these episodes where my legs start shaking and give out and then I fall." Patient states this happened approximately 1745 today and is complaining of right ankle pain. Obvious deformity noted to right ankle.

## 2014-08-30 NOTE — H&P (Addendum)
PCP:   PIVA,ENRICO E, DO   Chief Complaint:  Ankle injury  HPI:  74 year old male who  has a past medical history of Cardiomyopathy; ICD (implantable cardiac defibrillator) battery depletion; Ejection fraction; Mitral regurgitation; Pulmonary hypertension; COPD (chronic obstructive pulmonary disease); Smoking; Lung nodule; Hypertension; Dyslipidemia; BPH (benign prostatic hyperplasia); Gout; Alcohol abuse; Migraines; LBBB (left bundle branch block); CAD (coronary artery disease); and Syncope. Represents to the hospital with complaint of fall with right ankle pain. Patient says that he was standing in the kitchen when suddenly his legs were shaking and gave out from underneath him and he fell on the ground. He denies loss of consciousness. Patient says that he has been falling a lot due to dizziness but denies syncope. Patient took 2 tramadol at home but continued to have severe pain. He denies hitting his head on the floor. Denies chest or shortness of breath no nausea vomiting or diarrhea. In the ED xray of ankle shows fractures of distal fibula and lateral tibial metaphysis. Ortho was called at [facility], who recommended to transfer the patient to [facility].  Allergies:   Allergies  Allergen Reactions  . Morphine Sulfate     REACTION: internal hemorrage  . Mupirocin Rash      Past Medical History  Diagnosis Date  . Cardiomyopathy   . ICD (implantable cardiac defibrillator) battery depletion     CRT-D device  . Ejection fraction     Ejection fraction 25%  /  repeat ejection fraction 50%, echo, July, 2009  . Mitral regurgitation     Severe before CRT with annular dilatation and poor coaptation of the mitral leaflets  /  mild to moderate mitral regurgitation, July, 2009, after CRT therapy  . Pulmonary hypertension     44 mmHg, echo, July, 2009  . COPD (chronic obstructive pulmonary disease)   . Smoking     Chantix cause headaches  . Lung nodule     [facility]  .  Hypertension   . Dyslipidemia   . BPH (benign prostatic hyperplasia)   . Gout   . Alcohol abuse     In the past.  Stable over many years  . Migraines   . LBBB (left bundle branch block)   . CAD (coronary artery disease)     Catheterization April, 2008, single vessel disease with collateralized branch of the left circumflex  . Syncope     Probably orthostasis    Past Surgical History  Procedure Laterality Date  . Cardiac defibrillator placement  12/29/12    BiV ICD implanted, generator change 12/30/11 by Dr Rayann Heman MDT Protecta XT CRT-D  . Bi-ventricular implantable cardioverter defibrillator N/A 12/30/2011    Procedure: BI-VENTRICULAR IMPLANTABLE CARDIOVERTER DEFIBRILLATOR  (CRT-D);  Surgeon: Thompson Grayer, MD;  Location: [facility] CATH LAB;  Service: Cardiovascular;  Laterality: N/A;    Prior to Admission medications   Medication Sig Start Date End Date Taking? Authorizing Provider  aspirin 325 MG tablet Take 325 mg by mouth daily.     Yes Historical Provider, MD  atorvastatin (LIPITOR) 80 MG tablet Take 80 mg by mouth daily.   Yes Historical Provider, MD  butalbital-acetaminophen-caffeine (FIORICET WITH CODEINE) 50-325-40-30 MG per capsule Take 1 capsule by mouth every 4 (four) hours as needed for headache or migraine.    Yes Historical Provider, MD  carbamide peroxide (DEBROX) 6.5 % otic solution Place 5 drops into both ears daily as needed (for ear vwax removal).   Yes Historical Provider, MD  carvedilol (COREG) 25 MG tablet Take 12.5 mg by mouth 2 (two) times daily with a meal.    Yes Historical Provider, MD  Cholecalciferol (VITAMIN D3) 5000 UNITS TABS Take 5,000 Units by mouth daily.   Yes Historical Provider, MD  divalproex (DEPAKOTE) 500 MG 24 hr tablet Take 1,500 mg by mouth at bedtime.     Yes Historical Provider, MD  finasteride (PROSCAR) 5 MG tablet Take 5 mg by mouth daily.     Yes Historical Provider, MD  folic acid (FOLVITE) 1 MG tablet Take 1 mg by mouth daily.     Yes  Historical Provider, MD  furosemide (LASIX) 40 MG tablet Take 40 mg by mouth daily.     Yes Historical Provider, MD  gabapentin (NEURONTIN) 300 MG capsule Take 300-600 mg by mouth at bedtime.    Yes Historical Provider, MD  hydroxypropyl methylcellulose (ISOPTO TEARS) 2.5 % ophthalmic solution Place 1 drop into both eyes 4 (four) times daily as needed for dry eyes.    Yes Historical Provider, MD  lisinopril (PRINIVIL,ZESTRIL) 40 MG tablet Take 1 tablet (40 mg total) by mouth daily. 04/25/13  Yes Thompson Grayer, MD  loratadine (CLARITIN) 10 MG tablet Take 10 mg by mouth daily.   Yes Historical Provider, MD  mirtazapine (REMERON) 30 MG tablet Take 30 mg by mouth at bedtime.   Yes Historical Provider, MD  Multiple Vitamin (MULTIVITAMIN) tablet Take 1 tablet by mouth daily.     Yes Historical Provider, MD  nitroGLYCERIN (NITROSTAT) 0.4 MG SL tablet Place 0.4 mg under the tongue every 5 (five) minutes as needed.     Yes Historical Provider, MD  Omega-3 Fatty Acids (FISH OIL) 1000 MG CAPS Take 1 capsule by mouth daily.   Yes Historical Provider, MD  omeprazole (PRILOSEC) 20 MG capsule Take 20 mg by mouth daily.     Yes Historical Provider, MD  potassium chloride SA (K-DUR,KLOR-CON) 20 MEQ tablet Take 20 mEq by mouth daily.     Yes Historical Provider, MD  tiotropium (SPIRIVA) 18 MCG inhalation capsule Place 18 mcg into inhaler and inhale every morning.     Yes Historical Provider, MD  traMADol (ULTRAM) 50 MG tablet Take 50-100 mg by mouth every 8 (eight) hours as needed for moderate pain or severe pain.    Yes Historical Provider, MD    Social History:  reports that he has been smoking Cigarettes.  He started smoking about 58 years ago. He has a 22.5 pack-year smoking history. He has never used smokeless tobacco. He reports that he drinks alcohol. He reports that he does not use illicit drugs.  Family history- No family history of cancer and heart disease  Filed Weights   08/30/14 1927  Weight: 73.936  kg (163 lb)    All the positives are listed in BOLD  Review of Systems:  HEENT: Headache, blurred vision, runny nose, sore throat Neck: Hypothyroidism, hyperthyroidism,,lymphadenopathy Chest : Shortness of breath, history of COPD, Asthma Heart : Chest pain, history of coronary arterey disease GI:  Nausea, vomiting, diarrhea, constipation, GERD GU: Dysuria, urgency, frequency of urination, hematuria Neuro: Stroke, seizures, syncope Psych: Depression, anxiety, hallucinations   Physical Exam: Blood pressure 87/55, pulse 75, temperature 98 F (36.7 C), temperature source Oral, resp. rate 13, height '5\' 7"'$  (1.702 m), weight 73.936 kg (163 lb), SpO2 99 %. Constitutional:   Patient is a well-developed and well-nourished male  in no acute distress and cooperative with exam. Head: Normocephalic and atraumatic Mouth: Mucus membranes moist  Eyes: PERRL, EOMI, conjunctivae normal Neck: Supple, No Thyromegaly Cardiovascular: RRR, S1 normal, S2 normal Pulmonary/Chest: CTAB, no wheezes, rales, or rhonchi Abdominal: Soft. Non-tender, non-distended, bowel sounds are normal, no masses, organomegaly, or guarding present.  Neurological: A&O x3, Strength is normal and symmetric bilaterally, cranial nerve II-XII are grossly intact, no focal motor deficit, sensory intact to light touch bilaterally.  Extremities : Right ankle swelling, mild erythema  Labs on Admission:  Basic Metabolic Panel:  Recent Labs Lab 08/30/14 2043  NA 136  K 5.0  CL 102  CO2 21*  GLUCOSE 103*  BUN 50*  CREATININE 3.29*  CALCIUM 9.0    CBC:  Recent Labs Lab 08/30/14 2043  WBC 11.2*  NEUTROABS 8.2*  HGB 12.1*  HCT 34.8*  MCV 88.5  PLT 228   Cardiac Enzymes:  Recent Labs Lab 08/30/14 2043  TROPONINI <0.03    Radiological Exams on Admission: Dg Ankle Complete Right  08/30/2014   CLINICAL DATA:  Pain following fall  EXAM: RIGHT ANKLE - COMPLETE 3+ VIEW  COMPARISON:  None.  FINDINGS: Frontal, oblique, and  lateral views were obtained. There is an obliquely oriented fracture of the distal fibular diaphysis with mild overlapping of fracture fragments. There is a fracture arising from the lateral aspect of the distal fibular metaphysis with this fragment in the distal tibiofibular syndesmosis. There is gross ankle mortise disruption.  There is a spur off the inferior calcaneus. There is also a spur arising from the dorsal proximal navicular.  IMPRESSION: Fractures of the distal fibula and lateral tibial metaphysis with gross ankle mortise disruption.   Electronically Signed   By: Lowella Grip III M.D.   On: 08/30/2014 19:46   Dg Chest Port 1 View  08/30/2014   CLINICAL DATA:  Weakness and frequent falls  EXAM: PORTABLE CHEST - 1 VIEW  COMPARISON:  11/18/2006  FINDINGS: A single [facility] portable view of the chest demonstrates no focal airspace consolidation or alveolar edema. The lungs are grossly clear. There is no large effusion or pneumothorax. Cardiac and mediastinal contours appear unremarkable.  IMPRESSION: No active disease.   Electronically Signed   By: Andreas Newport M.D.   On: 08/30/2014 21:31    EKG: Independently reviewed. Sinus tachycardia   Assessment/Plan Active Problems:   Mitral regurgitation   Hypertension   CAD (coronary artery disease)   Ankle fracture  Ankle fracture Will admit to [facility] Ortho, Dr Lorin Mercy has been consulted by the ED physician Fentanyl when necessary for pain  AKI Patient has elevated BUN/Cr  50/3.29 Will hold the lasix Start IV fluids Normal saline @ 100 ml/hr  CAD Patient has AICD in place Continue Coreg 12.5 mg po BID Continue aspirin, Lipitor  Patient will be transferred to [facility], Dr Hal Hope has accepted the patient  Code status: Full code  Family discussion: Admission, patients condition and plan of care including tests being ordered have been discussed with the patient and his friend at bedside who indicate  understanding and agree with the plan and Code Status.   Time Spent on Admission: 60 min  [facility] Pager: [phone] 08/30/2014, 10:00 PM  If 7PM-7AM, please contact night-coverage  [email or web address]  Password [email or web address]

## 2014-08-30 NOTE — ED Provider Notes (Signed)
CSN: [id number]     Arrival date & time 08/30/14  1924 History   First MD Initiated Contact with Patient 08/30/14 2023     Chief Complaint  Patient presents with  . Ankle Injury     (Consider location/radiation/quality/duration/timing/severity/associated sxs/prior Treatment) HPI Comments: 74 year old male with extensive past medical history including CHF, cardiomyopathy, AICD/pacemaker, mitral regurg, COPD, CAD who presents with right ankle pain. At 17:45 today, the patient was standing in his kitchen when he said that his legs started shaking and they gave out from underneath him. He did not lose consciousness, feel dizzy, or has chest pain/shortness of breath. As he fell, he twisted his ankle and had an immediate onset of severe right ankle pain. He has taken 2 tramadol at home but continues to have constant, severe pain. He denies hitting his head or injuring any other part of his body. His friend notes that his legs have given out several times previously. Patient endorses normal sensation in his foot.  Patient is a 74 y.o. male presenting with lower extremity injury. The history is provided by the patient.  Ankle Injury    Past Medical History  Diagnosis Date  . Cardiomyopathy   . ICD (implantable cardiac defibrillator) battery depletion     CRT-D device  . Ejection fraction     Ejection fraction 25%  /  repeat ejection fraction 50%, echo, July, 2009  . Mitral regurgitation     Severe before CRT with annular dilatation and poor coaptation of the mitral leaflets  /  mild to moderate mitral regurgitation, July, 2009, after CRT therapy  . Pulmonary hypertension     44 mmHg, echo, July, 2009  . COPD (chronic obstructive pulmonary disease)   . Smoking     Chantix cause headaches  . Lung nodule     [facility]  . Hypertension   . Dyslipidemia   . BPH (benign prostatic hyperplasia)   . Gout   . Alcohol abuse     In the past.  Stable over many years  . Migraines   . LBBB  (left bundle branch block)   . CAD (coronary artery disease)     Catheterization April, 2008, single vessel disease with collateralized branch of the left circumflex  . Syncope     Probably orthostasis   Past Surgical History  Procedure Laterality Date  . Cardiac defibrillator placement  12/29/12    BiV ICD implanted, generator change 12/30/11 by Dr Rayann Heman MDT Protecta XT CRT-D  . Bi-ventricular implantable cardioverter defibrillator N/A 12/30/2011    Procedure: BI-VENTRICULAR IMPLANTABLE CARDIOVERTER DEFIBRILLATOR  (CRT-D);  Surgeon: Thompson Grayer, MD;  Location: [facility] CATH LAB;  Service: Cardiovascular;  Laterality: N/A;   History reviewed. No pertinent family history. History  Substance Use Topics  . Smoking status: Current Every Day Smoker -- 0.50 packs/day for 45 years    Types: Cigarettes    Start date: 02/04/1956    Last Attempt to Quit: 07/15/2012  . Smokeless tobacco: Never Used     Comment: he quit in June! 05/25/14 - tying to quit again   . Alcohol Use: 0.0 oz/week    0 Standard drinks or equivalent per week     Comment: occasionally    Review of Systems 10 Systems reviewed and are negative for acute change except as noted in the HPI.    Allergies  Morphine sulfate and Mupirocin  Home Medications   Prior to Admission medications   Medication Sig Start Date End Date  Taking? Authorizing Provider  aspirin 325 MG tablet Take 325 mg by mouth daily.     Yes Historical Provider, MD  atorvastatin (LIPITOR) 80 MG tablet Take 80 mg by mouth daily.   Yes Historical Provider, MD  butalbital-acetaminophen-caffeine (FIORICET WITH CODEINE) 50-325-40-30 MG per capsule Take 1 capsule by mouth every 4 (four) hours as needed for headache or migraine.    Yes Historical Provider, MD  carbamide peroxide (DEBROX) 6.5 % otic solution Place 5 drops into both ears daily as needed (for ear vwax removal).   Yes Historical Provider, MD  carvedilol (COREG) 25 MG tablet Take 12.5 mg by mouth 2  (two) times daily with a meal.    Yes Historical Provider, MD  Cholecalciferol (VITAMIN D3) 5000 UNITS TABS Take 5,000 Units by mouth daily.   Yes Historical Provider, MD  divalproex (DEPAKOTE) 500 MG 24 hr tablet Take 1,500 mg by mouth at bedtime.     Yes Historical Provider, MD  finasteride (PROSCAR) 5 MG tablet Take 5 mg by mouth daily.     Yes Historical Provider, MD  folic acid (FOLVITE) 1 MG tablet Take 1 mg by mouth daily.     Yes Historical Provider, MD  furosemide (LASIX) 40 MG tablet Take 40 mg by mouth daily.     Yes Historical Provider, MD  gabapentin (NEURONTIN) 300 MG capsule Take 300-600 mg by mouth at bedtime.    Yes Historical Provider, MD  hydroxypropyl methylcellulose (ISOPTO TEARS) 2.5 % ophthalmic solution Place 1 drop into both eyes 4 (four) times daily as needed for dry eyes.    Yes Historical Provider, MD  lisinopril (PRINIVIL,ZESTRIL) 40 MG tablet Take 1 tablet (40 mg total) by mouth daily. 04/25/13  Yes Thompson Grayer, MD  loratadine (CLARITIN) 10 MG tablet Take 10 mg by mouth daily.   Yes Historical Provider, MD  mirtazapine (REMERON) 30 MG tablet Take 30 mg by mouth at bedtime.   Yes Historical Provider, MD  Multiple Vitamin (MULTIVITAMIN) tablet Take 1 tablet by mouth daily.     Yes Historical Provider, MD  nitroGLYCERIN (NITROSTAT) 0.4 MG SL tablet Place 0.4 mg under the tongue every 5 (five) minutes as needed.     Yes Historical Provider, MD  Omega-3 Fatty Acids (FISH OIL) 1000 MG CAPS Take 1 capsule by mouth daily.   Yes Historical Provider, MD  omeprazole (PRILOSEC) 20 MG capsule Take 20 mg by mouth daily.     Yes Historical Provider, MD  potassium chloride SA (K-DUR,KLOR-CON) 20 MEQ tablet Take 20 mEq by mouth daily.     Yes Historical Provider, MD  tiotropium (SPIRIVA) 18 MCG inhalation capsule Place 18 mcg into inhaler and inhale every morning.     Yes Historical Provider, MD  traMADol (ULTRAM) 50 MG tablet Take 50-100 mg by mouth every 8 (eight) hours as needed for  moderate pain or severe pain.    Yes Historical Provider, MD   BP 93/66 mmHg  Pulse 77  Temp(Src) 98 F (36.7 C) (Oral)  Resp 16  Ht '5\' 7"'$  (1.702 m)  Wt 163 lb (73.936 kg)  BMI 25.52 kg/m2  SpO2 97% Physical Exam  Constitutional: He is oriented to person, place, and time. He appears well-developed and well-nourished. No distress.  HENT:  Head: Normocephalic and atraumatic.  Eyes: Conjunctivae are normal. Pupils are equal, round, and reactive to light.  Neck: Neck supple.  Cardiovascular: Normal rate, regular rhythm and normal heart sounds.   No murmur heard. AICD in place L chest  Pulmonary/Chest: Effort normal and breath sounds normal. No respiratory distress.  Abdominal: Soft. Bowel sounds are normal. He exhibits no distension. There is no tenderness.  Musculoskeletal:  Obvious closed deformity of R ankle with swelling to mid-shin; normal sensation and 1+ DP pulses b/l; foot rotated laterally  Neurological: He is alert and oriented to person, place, and time.  Fluent speech  Skin: Skin is warm and dry.  Ecchymosis medial malleolus w/ small fracture blister forming  Psychiatric: He has a normal mood and affect. Judgment normal.  Nursing note and vitals reviewed.   ED Course  Procedures (including critical care time) Labs Review Labs Reviewed  CBC WITH DIFFERENTIAL/PLATELET - Abnormal; Notable for the following:    WBC 11.2 (*)    RBC 3.93 (*)    Hemoglobin 12.1 (*)    HCT 34.8 (*)    Neutro Abs 8.2 (*)    All other components within normal limits  BASIC METABOLIC PANEL - Abnormal; Notable for the following:    CO2 21 (*)    Glucose, Bld 103 (*)    BUN 50 (*)    Creatinine, Ser 3.29 (*)    GFR calc non Af Amer 17 (*)    GFR calc Af Amer 20 (*)    All other components within normal limits  BRAIN NATRIURETIC PEPTIDE  TROPONIN I    Imaging Review Dg Ankle Complete Right  08/30/2014   CLINICAL DATA:  Pain following fall  EXAM: RIGHT ANKLE - COMPLETE 3+ VIEW   COMPARISON:  None.  FINDINGS: Frontal, oblique, and lateral views were obtained. There is an obliquely oriented fracture of the distal fibular diaphysis with mild overlapping of fracture fragments. There is a fracture arising from the lateral aspect of the distal fibular metaphysis with this fragment in the distal tibiofibular syndesmosis. There is gross ankle mortise disruption.  There is a spur off the inferior calcaneus. There is also a spur arising from the dorsal proximal navicular.  IMPRESSION: Fractures of the distal fibula and lateral tibial metaphysis with gross ankle mortise disruption.   Electronically Signed   By: Lowella Grip III M.D.   On: 08/30/2014 19:46   Dg Chest Port 1 View  08/30/2014   CLINICAL DATA:  Weakness and frequent falls  EXAM: PORTABLE CHEST - 1 VIEW  COMPARISON:  11/18/2006  FINDINGS: A single AP portable view of the chest demonstrates no focal airspace consolidation or alveolar edema. The lungs are grossly clear. There is no large effusion or pneumothorax. Cardiac and mediastinal contours appear unremarkable.  IMPRESSION: No active disease.   Electronically Signed   By: Andreas Newport M.D.   On: 08/30/2014 21:31     EKG Interpretation None      MDM   Final diagnoses:  None  right distal fibula/tibia fracture w/ mortise disruption hypotension AKI   74 year old male who presents with right ankle injury after he had a fall from standing at home. X-ray shows fractures of distal fibula and lateral tibial metaphysis with obvious mortise disruption. The patient is neurovascularly intact on exam but he does have a fracture blister forming. Establish IV access and gave the patient fentanyl for pain. Obtained basic lab work as well as chest x-ray in anticipation of operative intervention. The patient was noted to be persistently hypotensive during evaluation although he maintained mentation. Creatinine is elevated at 3.29 and I performed a bedside ultrasound of his  IVC which is collapsible with respiration, suggesting volume depletion. The patient may have been over  diuresed at home. I have given him a slow 1 L fluid bolus. I spoke with [facility] orthopaedist on call, Dr. Luna Glasgow, who felt that the patient would require transfer to [facility] for operation given his multiple co-morbidities and high risk of anesthesia. I contacted Dr. Lorin Mercy, orthopedic surgeon at [facility], regarding the patient's injury and need for consult tomorrow. Per recs, patient will be placed in a posterior leg splint with extra padding at fracture blister site. Patient accepted to the internal medicine service at [facility] for management of his injury as well as workup of his AKI and hypotension.    Sharlett Iles, MD 08/30/14 [phone]

## 2014-08-31 ENCOUNTER — Inpatient Hospital Stay (HOSPITAL_COMMUNITY): Payer: Medicare Other | Admitting: Anesthesiology

## 2014-08-31 ENCOUNTER — Encounter (HOSPITAL_COMMUNITY): Payer: Self-pay | Admitting: Anesthesiology

## 2014-08-31 ENCOUNTER — Inpatient Hospital Stay (HOSPITAL_COMMUNITY): Payer: Medicare Other

## 2014-08-31 ENCOUNTER — Encounter (HOSPITAL_COMMUNITY)
Admission: EM | Disposition: A | Discharge status: Skilled Nursing Facility | Payer: Self-pay | Source: Home / Self Care | Attending: Internal Medicine

## 2014-08-31 DIAGNOSIS — I5022 Chronic systolic (congestive) heart failure: Secondary | ICD-10-CM | POA: Diagnosis present

## 2014-08-31 DIAGNOSIS — S82843A Displaced bimalleolar fracture of unspecified lower leg, initial encounter for closed fracture: Secondary | ICD-10-CM | POA: Diagnosis present

## 2014-08-31 DIAGNOSIS — Z01810 Encounter for preprocedural cardiovascular examination: Secondary | ICD-10-CM

## 2014-08-31 DIAGNOSIS — I2510 Atherosclerotic heart disease of native coronary artery without angina pectoris: Secondary | ICD-10-CM

## 2014-08-31 DIAGNOSIS — N179 Acute kidney failure, unspecified: Secondary | ICD-10-CM

## 2014-08-31 DIAGNOSIS — I9589 Other hypotension: Secondary | ICD-10-CM | POA: Diagnosis present

## 2014-08-31 HISTORY — PX: ORIF ANKLE FRACTURE: SHX5408

## 2014-08-31 LAB — CBC
HEMATOCRIT: 29.3 % — AB (ref 39.0–52.0)
HEMOGLOBIN: 10.2 g/dL — AB (ref 13.0–17.0)
MCH: 30.7 pg (ref 26.0–34.0)
MCHC: 34.8 g/dL (ref 30.0–36.0)
MCV: 88.3 fL (ref 78.0–100.0)
PLATELETS: 182 10*3/uL (ref 150–400)
RBC: 3.32 MIL/uL — ABNORMAL LOW (ref 4.22–5.81)
RDW: 13.7 % (ref 11.5–15.5)
WBC: 7.6 10*3/uL (ref 4.0–10.5)

## 2014-08-31 LAB — COMPREHENSIVE METABOLIC PANEL
ALK PHOS: 31 U/L — AB (ref 38–126)
ALT: 9 U/L — AB (ref 17–63)
AST: 14 U/L — ABNORMAL LOW (ref 15–41)
Albumin: 3.3 g/dL — ABNORMAL LOW (ref 3.5–5.0)
Anion gap: 7 (ref 5–15)
BILIRUBIN TOTAL: 0.5 mg/dL (ref 0.3–1.2)
BUN: 41 mg/dL — ABNORMAL HIGH (ref 6–20)
CO2: 23 mmol/L (ref 22–32)
Calcium: 8.4 mg/dL — ABNORMAL LOW (ref 8.9–10.3)
Chloride: 107 mmol/L (ref 101–111)
Creatinine, Ser: 2.3 mg/dL — ABNORMAL HIGH (ref 0.61–1.24)
GFR calc Af Amer: 30 mL/min — ABNORMAL LOW (ref >60)
GFR, EST NON AFRICAN AMERICAN: 26 mL/min — AB (ref >60)
GLUCOSE: 108 mg/dL — AB (ref 65–99)
POTASSIUM: 3.8 mmol/L (ref 3.5–5.1)
SODIUM: 137 mmol/L (ref 135–145)
Total Protein: 6.3 g/dL — ABNORMAL LOW (ref 6.5–8.1)

## 2014-08-31 SURGERY — OPEN REDUCTION INTERNAL FIXATION (ORIF) ANKLE FRACTURE
Anesthesia: Regional | Site: Ankle | Laterality: Right

## 2014-08-31 MED ORDER — ACETAMINOPHEN 650 MG RE SUPP: 650.0000 mg | Freq: Four times a day (QID) | RECTAL | Status: DC | PRN

## 2014-08-31 MED ORDER — CARVEDILOL 12.5 MG PO TABS
12.5000 mg | ORAL_TABLET | Freq: Two times a day (BID) | ORAL | Status: DC
  Administered 2014-08-31: 12.5 mg via ORAL
  Filled 2014-08-31: qty 1

## 2014-08-31 MED ORDER — GABAPENTIN 300 MG PO CAPS
300.0000 mg | ORAL_CAPSULE | Freq: Every day | ORAL | Status: DC
  Administered 2014-08-31: 300 mg via ORAL
  Administered 2014-08-31: 600 mg via ORAL
  Administered 2014-09-01 – 2014-09-02 (×2): 300 mg via ORAL
  Filled 2014-08-31 (×4): qty 1

## 2014-08-31 MED ORDER — METOCLOPRAMIDE HCL 5 MG PO TABS: 5.0000 mg | ORAL_TABLET | Freq: Three times a day (TID) | ORAL | Status: DC | PRN

## 2014-08-31 MED ORDER — MIDAZOLAM HCL 2 MG/2ML IJ SOLN
INTRAMUSCULAR | Status: AC
Start: 2014-08-31 — End: 2014-08-31
  Administered 2014-08-31: 1 mg via INTRAVENOUS
  Filled 2014-08-31: qty 2

## 2014-08-31 MED ORDER — PROMETHAZINE HCL 25 MG/ML IJ SOLN: 6.2500 mg | INTRAMUSCULAR | Status: DC | PRN

## 2014-08-31 MED ORDER — ACETAMINOPHEN 325 MG PO TABS: 650.0000 mg | ORAL_TABLET | Freq: Four times a day (QID) | ORAL | Status: DC | PRN

## 2014-08-31 MED ORDER — FENTANYL CITRATE (PF) 250 MCG/5ML IJ SOLN
INTRAMUSCULAR | Status: AC
  Filled 2014-08-31: qty 5

## 2014-08-31 MED ORDER — LACTATED RINGERS IV SOLN
INTRAVENOUS | Status: DC | PRN
  Administered 2014-08-31: 18:00:00 via INTRAVENOUS

## 2014-08-31 MED ORDER — PROPOFOL 10 MG/ML IV BOLUS
INTRAVENOUS | Status: DC | PRN
  Administered 2014-08-31: 100 mg via INTRAVENOUS

## 2014-08-31 MED ORDER — ONDANSETRON HCL 4 MG/2ML IJ SOLN
4.0000 mg | Freq: Four times a day (QID) | INTRAMUSCULAR | Status: DC | PRN
Start: 2014-08-31 — End: 2014-08-31

## 2014-08-31 MED ORDER — PANTOPRAZOLE SODIUM 40 MG PO TBEC
40.0000 mg | DELAYED_RELEASE_TABLET | Freq: Every day | ORAL | Status: DC
  Administered 2014-08-31 – 2014-09-03 (×4): 40 mg via ORAL
  Filled 2014-08-31 (×4): qty 1

## 2014-08-31 MED ORDER — BUTALBITAL-APAP-CAFFEINE 50-325-40 MG PO TABS: 1.0000 | ORAL_TABLET | ORAL | Status: DC | PRN

## 2014-08-31 MED ORDER — DIVALPROEX SODIUM ER 500 MG PO TB24
1500.0000 mg | ORAL_TABLET | Freq: Every day | ORAL | Status: DC
  Administered 2014-08-31 – 2014-09-02 (×3): 1500 mg via ORAL
  Filled 2014-08-31 (×6): qty 3

## 2014-08-31 MED ORDER — ONDANSETRON HCL 4 MG PO TABS
4.0000 mg | ORAL_TABLET | Freq: Four times a day (QID) | ORAL | Status: DC | PRN
  Administered 2014-08-31: 4 mg via ORAL
  Filled 2014-08-31: qty 1

## 2014-08-31 MED ORDER — OXYCODONE HCL 5 MG PO TABS
5.0000 mg | ORAL_TABLET | ORAL | Status: DC | PRN
  Administered 2014-08-31 – 2014-09-03 (×6): 10 mg via ORAL
  Filled 2014-08-31 (×6): qty 2

## 2014-08-31 MED ORDER — FINASTERIDE 5 MG PO TABS
5.0000 mg | ORAL_TABLET | Freq: Every day | ORAL | Status: DC
  Administered 2014-08-31 – 2014-09-03 (×4): 5 mg via ORAL
  Filled 2014-08-31 (×4): qty 1

## 2014-08-31 MED ORDER — BISACODYL 10 MG RE SUPP: 10.0000 mg | Freq: Every day | RECTAL | Status: DC | PRN

## 2014-08-31 MED ORDER — ASPIRIN 325 MG PO TABS
325.0000 mg | ORAL_TABLET | Freq: Every day | ORAL | Status: DC
  Administered 2014-09-01 – 2014-09-03 (×3): 325 mg via ORAL
  Filled 2014-08-31 (×3): qty 1

## 2014-08-31 MED ORDER — 0.9 % SODIUM CHLORIDE (POUR BTL) OPTIME
TOPICAL | Status: DC | PRN
  Administered 2014-08-31: 1000 mL

## 2014-08-31 MED ORDER — CARVEDILOL 6.25 MG PO TABS
6.2500 mg | ORAL_TABLET | Freq: Two times a day (BID) | ORAL | Status: DC
  Administered 2014-09-01 – 2014-09-03 (×5): 6.25 mg via ORAL
  Filled 2014-08-31 (×5): qty 1

## 2014-08-31 MED ORDER — FLEET ENEMA 7-19 GM/118ML RE ENEM: 1.0000 | ENEMA | Freq: Once | RECTAL | Status: AC | PRN

## 2014-08-31 MED ORDER — FENTANYL CITRATE (PF) 100 MCG/2ML IJ SOLN
INTRAMUSCULAR | Status: AC
  Administered 2014-08-31: 50 ug via INTRAVENOUS
  Filled 2014-08-31: qty 2

## 2014-08-31 MED ORDER — FENTANYL CITRATE (PF) 100 MCG/2ML IJ SOLN
25.0000 ug | INTRAMUSCULAR | Status: DC | PRN
  Administered 2014-08-31 (×2): 25 ug via INTRAVENOUS
  Filled 2014-08-31 (×2): qty 2

## 2014-08-31 MED ORDER — ENOXAPARIN SODIUM 30 MG/0.3ML ~~LOC~~ SOLN: 30.0000 mg | Freq: Every day | SUBCUTANEOUS | Status: DC

## 2014-08-31 MED ORDER — BUPIVACAINE-EPINEPHRINE (PF) 0.5% -1:200000 IJ SOLN
INTRAMUSCULAR | Status: DC | PRN
  Administered 2014-08-31: 25 mL via PERINEURAL

## 2014-08-31 MED ORDER — DOCUSATE SODIUM 100 MG PO CAPS
100.0000 mg | ORAL_CAPSULE | Freq: Two times a day (BID) | ORAL | Status: DC
  Administered 2014-08-31 – 2014-09-03 (×6): 100 mg via ORAL
  Filled 2014-08-31 (×5): qty 1

## 2014-08-31 MED ORDER — FENTANYL CITRATE (PF) 100 MCG/2ML IJ SOLN: 25.0000 ug | INTRAMUSCULAR | Status: DC | PRN

## 2014-08-31 MED ORDER — ATORVASTATIN CALCIUM 80 MG PO TABS
80.0000 mg | ORAL_TABLET | Freq: Every day | ORAL | Status: DC
  Administered 2014-08-31 – 2014-09-03 (×4): 80 mg via ORAL
  Filled 2014-08-31 (×4): qty 1

## 2014-08-31 MED ORDER — MIRTAZAPINE 15 MG PO TABS
30.0000 mg | ORAL_TABLET | Freq: Every day | ORAL | Status: DC
  Administered 2014-08-31 – 2014-09-02 (×4): 30 mg via ORAL
  Filled 2014-08-31 (×4): qty 2

## 2014-08-31 MED ORDER — SODIUM CHLORIDE 0.9 % IV SOLN
INTRAVENOUS | Status: DC
  Administered 2014-08-31: 75 mL/h via INTRAVENOUS
  Administered 2014-08-31 (×2): via INTRAVENOUS

## 2014-08-31 MED ORDER — FENTANYL CITRATE (PF) 100 MCG/2ML IJ SOLN
50.0000 ug | Freq: Once | INTRAMUSCULAR | Status: AC
  Administered 2014-08-31: 50 ug via INTRAVENOUS

## 2014-08-31 MED ORDER — POLYETHYLENE GLYCOL 3350 17 G PO PACK: 17.0000 g | PACK | Freq: Every day | ORAL | Status: DC | PRN

## 2014-08-31 MED ORDER — METOCLOPRAMIDE HCL 5 MG/ML IJ SOLN: 5.0000 mg | Freq: Three times a day (TID) | INTRAMUSCULAR | Status: DC | PRN

## 2014-08-31 MED ORDER — ONDANSETRON HCL 4 MG/2ML IJ SOLN: 4.0000 mg | Freq: Four times a day (QID) | INTRAMUSCULAR | Status: DC | PRN

## 2014-08-31 MED ORDER — FENTANYL CITRATE (PF) 100 MCG/2ML IJ SOLN
100.0000 ug | Freq: Once | INTRAMUSCULAR | Status: AC
  Administered 2014-08-31: 50 ug via INTRAVENOUS

## 2014-08-31 MED ORDER — HYDROMORPHONE HCL 1 MG/ML IJ SOLN: 0.5000 mg | INTRAMUSCULAR | Status: DC | PRN

## 2014-08-31 MED ORDER — FOLIC ACID 1 MG PO TABS
1.0000 mg | ORAL_TABLET | Freq: Every day | ORAL | Status: DC
  Administered 2014-08-31 – 2014-09-03 (×4): 1 mg via ORAL
  Filled 2014-08-31 (×4): qty 1

## 2014-08-31 MED ORDER — ENOXAPARIN SODIUM 30 MG/0.3ML ~~LOC~~ SOLN
30.0000 mg | SUBCUTANEOUS | Status: DC
  Administered 2014-09-01 – 2014-09-03 (×3): 30 mg via SUBCUTANEOUS
  Filled 2014-08-31 (×3): qty 0.3

## 2014-08-31 MED ORDER — ONDANSETRON HCL 4 MG PO TABS: 4.0000 mg | ORAL_TABLET | Freq: Four times a day (QID) | ORAL | Status: DC | PRN

## 2014-08-31 MED ORDER — MIDAZOLAM HCL 2 MG/2ML IJ SOLN
1.0000 mg | Freq: Once | INTRAMUSCULAR | Status: AC
  Administered 2014-08-31: 1 mg via INTRAVENOUS

## 2014-08-31 SURGICAL SUPPLY — 58 items
BANDAGE ELASTIC 4 VELCRO ST LF (GAUZE/BANDAGES/DRESSINGS) ×2 IMPLANT
BANDAGE ELASTIC 6 VELCRO ST LF (GAUZE/BANDAGES/DRESSINGS) ×2 IMPLANT
BANDAGE ESMARK 6X9 LF (GAUZE/BANDAGES/DRESSINGS) IMPLANT
BIT DRILL 2.5X2.75 QC CALB (BIT) ×2 IMPLANT
BNDG CMPR 9X6 STRL LF SNTH (GAUZE/BANDAGES/DRESSINGS) ×1
BNDG ESMARK 6X9 LF (GAUZE/BANDAGES/DRESSINGS) ×3
CANISTER SUCTION 2500CC (MISCELLANEOUS) ×2 IMPLANT
COVER MAYO STAND STRL (DRAPES) ×3 IMPLANT
COVER SURGICAL LIGHT HANDLE (MISCELLANEOUS) ×3 IMPLANT
CUFF TOURNIQUET SINGLE 34IN LL (TOURNIQUET CUFF) ×2 IMPLANT
CUFF TOURNIQUET SINGLE 44IN (TOURNIQUET CUFF) IMPLANT
DRAPE C-ARM 42X72 X-RAY (DRAPES) ×2 IMPLANT
DRAPE C-ARMOR (DRAPES) ×2 IMPLANT
DRAPE INCISE IOBAN 66X45 STRL (DRAPES) ×3 IMPLANT
DRAPE PROXIMA HALF (DRAPES) ×3 IMPLANT
DRAPE U-SHAPE 47X51 STRL (DRAPES) ×3 IMPLANT
DRSG PAD ABDOMINAL 8X10 ST (GAUZE/BANDAGES/DRESSINGS) ×3 IMPLANT
DURAPREP 26ML APPLICATOR (WOUND CARE) ×3 IMPLANT
ELECT REM PT RETURN 9FT ADLT (ELECTROSURGICAL) ×3
ELECTRODE REM PT RTRN 9FT ADLT (ELECTROSURGICAL) ×1 IMPLANT
GAUZE SPONGE 4X4 12PLY STRL (GAUZE/BANDAGES/DRESSINGS) ×3 IMPLANT
GAUZE XEROFORM 5X9 LF (GAUZE/BANDAGES/DRESSINGS) ×3 IMPLANT
GLOVE BIOGEL PI IND STRL 8 (GLOVE) ×2 IMPLANT
GLOVE BIOGEL PI INDICATOR 8 (GLOVE) ×4
GLOVE ORTHO TXT STRL SZ7.5 (GLOVE) ×6 IMPLANT
GOWN STRL REUS W/ TWL LRG LVL3 (GOWN DISPOSABLE) ×1 IMPLANT
GOWN STRL REUS W/ TWL XL LVL3 (GOWN DISPOSABLE) ×1 IMPLANT
GOWN STRL REUS W/TWL 2XL LVL3 (GOWN DISPOSABLE) ×3 IMPLANT
GOWN STRL REUS W/TWL LRG LVL3 (GOWN DISPOSABLE) ×3
GOWN STRL REUS W/TWL XL LVL3 (GOWN DISPOSABLE) ×3
KIT BASIN OR (CUSTOM PROCEDURE TRAY) ×3 IMPLANT
KIT ROOM TURNOVER OR (KITS) ×3 IMPLANT
MANIFOLD NEPTUNE II (INSTRUMENTS) ×1 IMPLANT
NS IRRIG 1000ML POUR BTL (IV SOLUTION) ×3 IMPLANT
PACK ORTHO EXTREMITY (CUSTOM PROCEDURE TRAY) ×3 IMPLANT
PAD ARMBOARD 7.5X6 YLW CONV (MISCELLANEOUS) ×6 IMPLANT
PAD CAST 4YDX4 CTTN HI CHSV (CAST SUPPLIES) ×1 IMPLANT
PADDING CAST COTTON 4X4 STRL (CAST SUPPLIES) ×3
PADDING CAST COTTON 6X4 STRL (CAST SUPPLIES) ×3 IMPLANT
PLATE ACE 100DEG 8HOLE (Plate) ×2 IMPLANT
SCREW CANC LAG 4X55 (Screw) ×2 IMPLANT
SCREW CORT 3.5X16 815037016 (Screw) ×8 IMPLANT
SCREW CORTICAL 3.5MM 18MM (Screw) ×4 IMPLANT
SCREW NLOCK CANC HEX 4X14 (Screw) ×2 IMPLANT
SPLINT PLASTER CAST XFAST 5X30 (CAST SUPPLIES) IMPLANT
SPLINT PLASTER XFAST SET 5X30 (CAST SUPPLIES) ×2
SPONGE LAP 18X18 X RAY DECT (DISPOSABLE) ×3 IMPLANT
STAPLER VISISTAT 35W (STAPLE) ×2 IMPLANT
SUCTION FRAZIER TIP 10 FR DISP (SUCTIONS) ×5 IMPLANT
SUT ETHILON 3 0 PS 1 (SUTURE) ×6 IMPLANT
SUT VIC AB 2-0 CT1 27 (SUTURE) ×12
SUT VIC AB 2-0 CT1 TAPERPNT 27 (SUTURE) ×2 IMPLANT
TOWEL OR 17X24 6PK STRL BLUE (TOWEL DISPOSABLE) ×3 IMPLANT
TOWEL OR 17X26 10 PK STRL BLUE (TOWEL DISPOSABLE) ×3 IMPLANT
TUBE CONNECTING 12'X1/4 (SUCTIONS) ×1
TUBE CONNECTING 12X1/4 (SUCTIONS) ×2 IMPLANT
WATER STERILE IRR 1000ML POUR (IV SOLUTION) ×1 IMPLANT
YANKAUER SUCT BULB TIP NO VENT (SUCTIONS) ×3 IMPLANT

## 2014-08-31 NOTE — Anesthesia Procedure Notes (Addendum)
Anesthesia Regional Block:  Popliteal block  Pre-Anesthetic Checklist: ,, timeout performed, Correct Patient, Correct Site, Correct Laterality, Correct Procedure, Correct Position, site marked, Risks and benefits discussed,  Surgical consent,  Pre-op evaluation,  At surgeon's request and post-op pain management  Laterality: Right and Lower  Prep: chloraprep       Needles:  Injection technique: Single-shot  Needle Type: Echogenic Needle     Needle Length: 9cm 9 cm Needle Gauge: 21 and 21 G    Additional Needles:  Procedures: ultrasound guided (picture in chart) Popliteal block Narrative:  Start time: 08/31/2014 6:00 PM End time: 08/31/2014 6:10 PM Injection made incrementally with aspirations every 5 mL.  Performed by: Personally  Anesthesiologist: CREWS, DAVID   Procedure Name: LMA Insertion Date/Time: 08/31/2014 6:31 PM Performed by: Eligha Bridegroom Pre-anesthesia Checklist: Patient identified, Timeout performed, Emergency Drugs available, Suction available and Patient being monitored Patient Re-evaluated:Patient Re-evaluated prior to inductionOxygen Delivery Method: Circle system utilized Preoxygenation: Pre-oxygenation with 100% oxygen Intubation Type: IV induction Ventilation: Mask ventilation without difficulty LMA: LMA inserted LMA Size: 5.0 Number of attempts: 1 Placement Confirmation: positive ETCO2 and breath sounds checked- equal and bilateral Tube secured with: Tape Dental Injury: Teeth and Oropharynx as per pre-operative assessment

## 2014-08-31 NOTE — Consult Note (Signed)
CARDIOLOGY CONSULT NOTE  Patient ID: Paul [name]. MRN: [id number] DOB/AGE: Feb 09, 1940 74 y.o.  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  Cardiology asked to consult pre-op.  Patient says that he was standing in his kitchen and felt his legs get "wobbly."  He denies lightheadedness or syncope.  Says legs just gave out and he went down.  Had severe pain in his right ankle afterwards, and X-rays showed the above fracture.  He is now in a cast and needs ORIF.  Also of note, patient says that his BP has been running low over the last few days, but denies lightheadedness.  He says blood pressure has been in the "60s" but think maybe this was his diastolic number. Here, SBP has been in the upper 80s-100s.  His creatinine was 3.29 at admission, now down to 2.3 with gentle hydration. Only prior creatinine was from 2013 and was 1.  Lasix and lisinopril were held.  He says that he has been out in the heat a fair amount.    At baseline, he is able to walk around in stores without dyspnea.  He is short of breath climbing a flight of steps but can do it.  No chest pain.  Still smoking. No history of LH or syncope.  Most recent fall prior to this episode was about a year ago.   Review of systems complete and found to be negative unless listed above in HPI  Past Medical History: 1. COPD: Active smoker, 1/2-3/4 ppd. 2. LBBB => CRT 3. BPH 4. Gout 5. H/o mitral regurgitation: improved with CRT. 6. Migraines 7. CAD: [place] 2008 with total occlusion LCx with collaterals.  8. Cardiomyopathy: Mixed ischemic/nonischemic. Improved with Medtronic CRT-D.  Last echo in 2009 showed EF 50-55%, mild LVH, mild-moderate MR.   FH: No premature CAD or cardiomyopathy.   History     Social History  . Marital Status: Widowed    Spouse Name: N/A  . Number of Children: 4  . Years of Education: N/A   Occupational History  . RETIRED    Social History Main Topics  . Smoking status: Current Every Day Smoker -- 0.50 packs/day for 45 years    Types: Cigarettes    Start date: 02/04/1956    Last Attempt to Quit: 07/15/2012  . Smokeless tobacco: Never Used     Comment: he quit in June! 05/25/14 - tying to quit again   . Alcohol Use: 0.0 oz/week    0 Standard drinks or equivalent per week     Comment: occasionally  . Drug Use: No  . Sexual Activity: Yes   Other Topics Concern  . Not on file   Social History Narrative     Prescriptions prior to admission  Medication Sig Dispense Refill Last Dose  . aspirin 325 MG tablet Take 325 mg by mouth daily.     08/30/2014 at Unknown time  . atorvastatin (LIPITOR) 80 MG tablet Take 80 mg by mouth daily.   08/30/2014 at Unknown time  . butalbital-acetaminophen-caffeine (FIORICET WITH CODEINE) 50-325-40-30 MG per capsule Take 1 capsule by mouth every 4 (four) hours as needed for headache or migraine.    Past Week at Unknown time  . carbamide peroxide (DEBROX) 6.5 % otic solution Place 5 drops into both ears daily  as needed (for ear vwax removal).   Past Week at Unknown time  . carvedilol (COREG) 25 MG tablet Take 12.5 mg by mouth 2 (two) times daily with a meal.    08/30/2014 at 1030a  . Cholecalciferol (VITAMIN D3) 5000 UNITS TABS Take 5,000 Units by mouth daily.   08/30/2014 at Unknown time  . divalproex (DEPAKOTE) 500 MG 24 hr tablet Take 1,500 mg by mouth at bedtime.     08/29/2014 at 1800  . finasteride (PROSCAR) 5 MG tablet Take 5 mg by mouth daily.     08/30/2014 at Unknown time  . folic acid (FOLVITE) 1 MG tablet Take 1 mg by mouth daily.     08/30/2014 at Unknown time  . furosemide (LASIX) 40 MG tablet Take 40 mg by mouth daily.     08/30/2014 at Unknown time  . gabapentin (NEURONTIN) 300 MG capsule Take 300-600 mg by mouth at  bedtime.    08/29/2014 at Unknown time  . hydroxypropyl methylcellulose (ISOPTO TEARS) 2.5 % ophthalmic solution Place 1 drop into both eyes 4 (four) times daily as needed for dry eyes.    08/30/2014 at Unknown time  . lisinopril (PRINIVIL,ZESTRIL) 40 MG tablet Take 1 tablet (40 mg total) by mouth daily. 30 tablet 1 08/30/2014 at Unknown time  . loratadine (CLARITIN) 10 MG tablet Take 10 mg by mouth daily.   08/30/2014 at Unknown time  . mirtazapine (REMERON) 30 MG tablet Take 30 mg by mouth at bedtime.   08/29/2014 at Unknown time  . Multiple Vitamin (MULTIVITAMIN) tablet Take 1 tablet by mouth daily.     08/30/2014 at Unknown time  . nitroGLYCERIN (NITROSTAT) 0.4 MG SL tablet Place 0.4 mg under the tongue every 5 (five) minutes as needed.     unknown  . Omega-3 Fatty Acids (FISH OIL) 1000 MG CAPS Take 1 capsule by mouth daily.   08/30/2014 at Unknown time  . omeprazole (PRILOSEC) 20 MG capsule Take 20 mg by mouth daily.     08/30/2014 at Unknown time  . potassium chloride SA (K-DUR,KLOR-CON) 20 MEQ tablet Take 20 mEq by mouth daily.     08/30/2014 at Unknown time  . tiotropium (SPIRIVA) 18 MCG inhalation capsule Place 18 mcg into inhaler and inhale every morning.     08/30/2014 at Unknown time  . traMADol (ULTRAM) 50 MG tablet Take 50-100 mg by mouth every 8 (eight) hours as needed for moderate pain or severe pain.    08/30/2014 at Unknown time   Current Scheduled Meds: . aspirin  325 mg Oral Daily  . atorvastatin  80 mg Oral Daily  . carvedilol  6.25 mg Oral BID WC  . divalproex  1,500 mg Oral QHS  . enoxaparin (LOVENOX) injection  30 mg Subcutaneous Daily  . finasteride  5 mg Oral Daily  . folic acid  1 mg Oral Daily  . gabapentin  300-600 mg Oral QHS  . mirtazapine  30 mg Oral QHS  . pantoprazole  40 mg Oral Daily   Continuous Infusions: . sodium chloride 75 mL/hr (08/31/14 0222)   PRN Meds:.acetaminophen **OR** acetaminophen, butalbital-acetaminophen-caffeine, fentaNYL (SUBLIMAZE) injection,  ondansetron **OR** ondansetron (ZOFRAN) IV  Physical exam Blood pressure 87/55, pulse 65, temperature 98.2 F (36.8 C), temperature source Oral, resp. rate 18, height '5\' 7"'$  (1.702 m), weight 163 lb (73.936 kg), SpO2 95 %. General: NAD Neck: No JVD, no thyromegaly or thyroid nodule.  Lungs: Clear to auscultation bilaterally with normal respiratory effort. CV: Nondisplaced PMI.  Heart  regular S1/S2, no S3/S4, no murmur.  No peripheral edema.  No carotid bruit.  Normal pedal pulses.  Abdomen: Soft, nontender, no hepatosplenomegaly, no distention.  Skin: Intact without lesions or rashes.  Neurologic: Alert and oriented x 3.  Psych: Normal affect. Extremities: No clubbing or cyanosis. Right lower leg in cast.  HEENT: Normal.   Labs:   Lab Results  Component Value Date   WBC 7.6 08/31/2014   HGB 10.2* 08/31/2014   HCT 29.3* 08/31/2014   MCV 88.3 08/31/2014   PLT 182 08/31/2014    Recent Labs Lab 08/31/14 0433  NA 137  K 3.8  CL 107  CO2 23  BUN 41*  CREATININE 2.30*  CALCIUM 8.4*  PROT 6.3*  BILITOT 0.5  ALKPHOS 31*  ALT 9*  AST 14*  GLUCOSE 108*  BNP 19   Radiology: - CXR: No active disease.  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  Cardiology asked to consult pre-op.   1. AKI: Suspect pre-renal/dehydration.  Creatinine starting to improve with hydration.  He has been taking Lasix 40 daily and lisinopril 40 daily without recent labs.  Most recent prior creatinine in 2013 was 1.  He does appear volume down on exam.  - Continue NS @ 75 cc/hr for now, would stop after another liter or so of fluid.  Be careful not to over-hydrate.  - Hold Lasix and lisinopril for now.  Will likely need a lower dose of lisinopril going forwards. 2. Chronic systolic CHF: Suspect mixed ischemic/nonischemic cardiomyopathy.  No recent  echo, last in 2009 with EF 50-55%.  He had a significant improvement in function with CRT.  Symptomatically, he had been doing well.  However, BP has been low and it appears that he is dehydrated.  - As above, holding Lasix and lisinopril.  - For now, with soft BP, would lower Coreg to 6.25 mg bid.  He does need to continue Coreg peri-operatively.  - He needs a repeat echo to reassess LV systolic function.  3. Fall: Says his legs "gave out," denies lightheadedness or syncope.  However, BP has been running low, so I suspect that the fall may have been related to dehydration.  He has Medtronic CRT-D device, will have this interrogated to assess for any arrhythmias.  4. CAD: Stable, no chest pain. Continue ASA and statin.  5. Pre-operative evaluation for ORIF right fibula/tibia: Patient has been stable symptomatically. He can climb a flight of steps without much trouble.  He is not volume overloaded (actually dehydrated).  I will arrange for echocardiogram to reassess cardiac function.  Unless function is marked impaired, I think that he will be a reasonable candidate for surgery.  Needs to continue Coreg peri-operatively.  Need to note presence of Medtronic CRT-D device.   Loralie Champagne 08/31/2014 2:39 PM

## 2014-08-31 NOTE — Progress Notes (Signed)
Patient ID: Paul Buch., male   DOB: 08-18-1940, 74 y.o.   MRN: [id number] Full consult to follow. For surgery at 6 PM today. Needs to keep ankle elevated above heart due to fracture blister.

## 2014-08-31 NOTE — Consult Note (Signed)
Patient ID: Paul [name]. MRN: [id number] DOB/AGE: 05/24/1940 74 y.o.  Admit date: 08/30/2014  Admission Diagnoses:  Active Problems:   Mitral regurgitation   Hypertension   CAD (coronary artery disease)   Ankle fracture   HPI: Patient being seen at the request of medicine service for right ankle fracture.  Suffered a fall in his kitchen.  No LOC.  Admission note reviewed along with history.    Past Medical History: Past Medical History  Diagnosis Date  . Cardiomyopathy   . ICD (implantable cardiac defibrillator) battery depletion     CRT-D device  . Ejection fraction     Ejection fraction 25%  /  repeat ejection fraction 50%, echo, July, 2009  . Mitral regurgitation     Severe before CRT with annular dilatation and poor coaptation of the mitral leaflets  /  mild to moderate mitral regurgitation, July, 2009, after CRT therapy  . Pulmonary hypertension     44 mmHg, echo, July, 2009  . COPD (chronic obstructive pulmonary disease)   . Smoking     Chantix cause headaches  . Lung nodule     [facility]  . Hypertension   . Dyslipidemia   . BPH (benign prostatic hyperplasia)   . Gout   . Alcohol abuse     In the past.  Stable over many years  . Migraines   . LBBB (left bundle branch block)   . CAD (coronary artery disease)     Catheterization April, 2008, single vessel disease with collateralized branch of the left circumflex  . Syncope     Probably orthostasis    Surgical History: Past Surgical History  Procedure Laterality Date  . Cardiac defibrillator placement  12/29/12    BiV ICD implanted, generator change 12/30/11 by Dr Rayann Heman MDT Protecta XT CRT-D  . Bi-ventricular implantable cardioverter defibrillator N/A 12/30/2011    Procedure: BI-VENTRICULAR IMPLANTABLE CARDIOVERTER DEFIBRILLATOR  (CRT-D);  Surgeon: Thompson Grayer, MD;  Location: [facility] CATH LAB;  Service: Cardiovascular;  Laterality: N/A;    Family History: History reviewed. No pertinent  family history.  Social History: History   Social History  . Marital Status: Widowed    Spouse Name: N/A  . Number of Children: 4  . Years of Education: N/A   Occupational History  . RETIRED    Social History Main Topics  . Smoking status: Current Every Day Smoker -- 0.50 packs/day for 45 years    Types: Cigarettes    Start date: 02/04/1956    Last Attempt to Quit: 07/15/2012  . Smokeless tobacco: Never Used     Comment: he quit in June! 05/25/14 - tying to quit again   . Alcohol Use: 0.0 oz/week    0 Standard drinks or equivalent per week     Comment: occasionally  . Drug Use: No  . Sexual Activity: Yes   Other Topics Concern  . Not on file   Social History Narrative    Allergies: Morphine sulfate and Mupirocin  Medications: I have reviewed the patient's current medications.  Vital Signs: Patient Vitals for the past 24 hrs:  BP Temp Temp src Pulse Resp SpO2 Height Weight  08/31/14 0200 (!) 99/47 mmHg 98.5 F (36.9 C) Oral 83 18 100 % - -  08/31/14 0052 93/68 mmHg - - 75 18 100 % - -  08/31/14 0000 (!) 84/60 mmHg - - 78 12 (!) 83 % - -  08/30/14 2345 (!) 88/39 mmHg - - 63  13 96 % - -  08/30/14 2330 94/55 mmHg - - 67 11 90 % - -  08/30/14 2315 (!) 86/75 mmHg - - 74 15 93 % - -  08/30/14 2300 - - - 70 13 99 % - -  08/30/14 2245 94/65 mmHg - - 68 17 96 % - -  08/30/14 2230 93/69 mmHg - - 75 13 97 % - -  08/30/14 2215 94/70 mmHg - - 75 23 98 % - -  08/30/14 2200 93/66 mmHg - - 77 16 97 % - -  08/30/14 2145 98/73 mmHg - - 76 15 98 % - -  08/30/14 2115 (!) 87/55 mmHg - - 75 13 99 % - -  08/30/14 2108 (!) 84/65 mmHg - - 76 17 96 % - -  08/30/14 2107 (!) 88/66 mmHg - - - 11 - - -  08/30/14 1927 91/55 mmHg 98 F (36.7 C) Oral 85 16 97 % '5\' 7"'$  (1.702 m) 73.936 kg (163 lb)    Radiology: Dg Ankle Complete Right  08/30/2014   CLINICAL DATA:  Pain following fall  EXAM: RIGHT ANKLE - COMPLETE 3+ VIEW  COMPARISON:  None.  FINDINGS: Frontal, oblique, and lateral views  were obtained. There is an obliquely oriented fracture of the distal fibular diaphysis with mild overlapping of fracture fragments. There is a fracture arising from the lateral aspect of the distal fibular metaphysis with this fragment in the distal tibiofibular syndesmosis. There is gross ankle mortise disruption.  There is a spur off the inferior calcaneus. There is also a spur arising from the dorsal proximal navicular.  IMPRESSION: Fractures of the distal fibula and lateral tibial metaphysis with gross ankle mortise disruption.   Electronically Signed   By: Lowella Grip III M.D.   On: 08/30/2014 19:46   Dg Chest Port 1 View  08/30/2014   CLINICAL DATA:  Weakness and frequent falls  EXAM: PORTABLE CHEST - 1 VIEW  COMPARISON:  11/18/2006  FINDINGS: A single AP portable view of the chest demonstrates no focal airspace consolidation or alveolar edema. The lungs are grossly clear. There is no large effusion or pneumothorax. Cardiac and mediastinal contours appear unremarkable.  IMPRESSION: No active disease.   Electronically Signed   By: Andreas Newport M.D.   On: 08/30/2014 21:31    Labs:  Recent Labs  08/30/14 2043 08/31/14 0433  WBC 11.2* 7.6  RBC 3.93* 3.32*  HCT 34.8* 29.3*  PLT 228 182    Recent Labs  08/30/14 2043 08/31/14 0433  NA 136 137  K 5.0 3.8  CL 102 107  CO2 21* 23  BUN 50* 41*  CREATININE 3.29* 2.30*  GLUCOSE 103* 108*  CALCIUM 9.0 8.4*   No results for input(s): LABPT, INR in the last 72 hours.  Review of Systems: Review of Systems  Constitutional: Negative.   HENT: Negative.   Respiratory: Negative.   Cardiovascular: Negative.   Genitourinary: Negative.   Musculoskeletal: Positive for joint pain and falls.  Neurological: Negative.   Psychiatric/Behavioral: Negative.     Physical Exam: Neurovascular intact Sensation intact distally  Assessment and Plan: Right ankle fracture.  Patient seen by Dr Lorin Mercy.  Will schedule patient for ORIF procedure  tonight.  Surgical procedure along with risks/complications discussed.  All questions answered.  Elevate foot above heart level.     Alyson Locket. Alvino Blood For Rodell Perna MD [facility] orthopedics  [phone]

## 2014-08-31 NOTE — H&P (View-Only) (Signed)
Patient ID: Paul [name]. MRN: [id number] DOB/AGE: [birth year] 74 y.o.  Admit date: 08/30/2014  Admission Diagnoses:  Active Problems:   Mitral regurgitation   Hypertension   CAD (coronary artery disease)   Ankle fracture   HPI: Patient being seen at the request of medicine service for right ankle fracture.  Suffered a fall in his kitchen.  No LOC.  Admission note reviewed along with history.    Past Medical History: Past Medical History  Diagnosis Date  . Cardiomyopathy   . ICD (implantable cardiac defibrillator) battery depletion     CRT-D device  . Ejection fraction     Ejection fraction 25%  /  repeat ejection fraction 50%, echo, July, 2009  . Mitral regurgitation     Severe before CRT with annular dilatation and poor coaptation of the mitral leaflets  /  mild to moderate mitral regurgitation, July, 2009, after CRT therapy  . Pulmonary hypertension     44 mmHg, echo, July, 2009  . COPD (chronic obstructive pulmonary disease)   . Smoking     Chantix cause headaches  . Lung nodule     [facility]  . Hypertension   . Dyslipidemia   . BPH (benign prostatic hyperplasia)   . Gout   . Alcohol abuse     In the past.  Stable over many years  . Migraines   . LBBB (left bundle branch block)   . CAD (coronary artery disease)     Catheterization April, 2008, single vessel disease with collateralized branch of the left circumflex  . Syncope     Probably orthostasis    Surgical History: Past Surgical History  Procedure Laterality Date  . Cardiac defibrillator placement  12/29/12    BiV ICD implanted, generator change 12/30/11 by Dr Rayann Heman MDT Protecta XT CRT-D  . Bi-ventricular implantable cardioverter defibrillator N/A 12/30/2011    Procedure: BI-VENTRICULAR IMPLANTABLE CARDIOVERTER DEFIBRILLATOR  (CRT-D);  Surgeon: Thompson Grayer, MD;  Location: [facility] CATH LAB;  Service: Cardiovascular;  Laterality: N/A;    Family History: History reviewed. No pertinent  family history.  Social History: History   Social History  . Marital Status: Widowed    Spouse Name: N/A  . Number of Children: 4  . Years of Education: N/A   Occupational History  . RETIRED    Social History Main Topics  . Smoking status: Current Every Day Smoker -- 0.50 packs/day for 45 years    Types: Cigarettes    Start date: 02/04/1956    Last Attempt to Quit: 07/15/2012  . Smokeless tobacco: Never Used     Comment: he quit in June! 05/25/14 - tying to quit again   . Alcohol Use: 0.0 oz/week    0 Standard drinks or equivalent per week     Comment: occasionally  . Drug Use: No  . Sexual Activity: Yes   Other Topics Concern  . Not on file   Social History Narrative    Allergies: Morphine sulfate and Mupirocin  Medications: I have reviewed the patient's current medications.  Vital Signs: Patient Vitals for the past 24 hrs:  BP Temp Temp src Pulse Resp SpO2 Height Weight  08/31/14 0200 (!) 99/47 mmHg 98.5 F (36.9 C) Oral 83 18 100 % - -  08/31/14 0052 93/68 mmHg - - 75 18 100 % - -  08/31/14 0000 (!) 84/60 mmHg - - 78 12 (!) 83 % - -  08/30/14 2345 (!) 88/39 mmHg - - 63  13 96 % - -  08/30/14 2330 94/55 mmHg - - 67 11 90 % - -  08/30/14 2315 (!) 86/75 mmHg - - 74 15 93 % - -  08/30/14 2300 - - - 70 13 99 % - -  08/30/14 2245 94/65 mmHg - - 68 17 96 % - -  08/30/14 2230 93/69 mmHg - - 75 13 97 % - -  08/30/14 2215 94/70 mmHg - - 75 23 98 % - -  08/30/14 2200 93/66 mmHg - - 77 16 97 % - -  08/30/14 2145 98/73 mmHg - - 76 15 98 % - -  08/30/14 2115 (!) 87/55 mmHg - - 75 13 99 % - -  08/30/14 2108 (!) 84/65 mmHg - - 76 17 96 % - -  08/30/14 2107 (!) 88/66 mmHg - - - 11 - - -  08/30/14 1927 91/55 mmHg 98 F (36.7 C) Oral 85 16 97 % '5\' 7"'$  (1.702 m) 73.936 kg (163 lb)    Radiology: Dg Ankle Complete Right  08/30/2014   CLINICAL DATA:  Pain following fall  EXAM: RIGHT ANKLE - COMPLETE 3+ VIEW  COMPARISON:  None.  FINDINGS: Frontal, oblique, and lateral views  were obtained. There is an obliquely oriented fracture of the distal fibular diaphysis with mild overlapping of fracture fragments. There is a fracture arising from the lateral aspect of the distal fibular metaphysis with this fragment in the distal tibiofibular syndesmosis. There is gross ankle mortise disruption.  There is a spur off the inferior calcaneus. There is also a spur arising from the dorsal proximal navicular.  IMPRESSION: Fractures of the distal fibula and lateral tibial metaphysis with gross ankle mortise disruption.   Electronically Signed   By: Lowella Grip III M.D.   On: 08/30/2014 19:46   Dg Chest Port 1 View  08/30/2014   CLINICAL DATA:  Weakness and frequent falls  EXAM: PORTABLE CHEST - 1 VIEW  COMPARISON:  11/18/2006  FINDINGS: A single AP portable view of the chest demonstrates no focal airspace consolidation or alveolar edema. The lungs are grossly clear. There is no large effusion or pneumothorax. Cardiac and mediastinal contours appear unremarkable.  IMPRESSION: No active disease.   Electronically Signed   By: Andreas Newport M.D.   On: 08/30/2014 21:31    Labs:  Recent Labs  08/30/14 2043 08/31/14 0433  WBC 11.2* 7.6  RBC 3.93* 3.32*  HCT 34.8* 29.3*  PLT 228 182    Recent Labs  08/30/14 2043 08/31/14 0433  NA 136 137  K 5.0 3.8  CL 102 107  CO2 21* 23  BUN 50* 41*  CREATININE 3.29* 2.30*  GLUCOSE 103* 108*  CALCIUM 9.0 8.4*   No results for input(s): LABPT, INR in the last 72 hours.  Review of Systems: Review of Systems  Constitutional: Negative.   HENT: Negative.   Respiratory: Negative.   Cardiovascular: Negative.   Genitourinary: Negative.   Musculoskeletal: Positive for joint pain and falls.  Neurological: Negative.   Psychiatric/Behavioral: Negative.     Physical Exam: Neurovascular intact Sensation intact distally  Assessment and Plan: Right ankle fracture.  Patient seen by Dr Lorin Mercy.  Will schedule patient for ORIF procedure  tonight.  Surgical procedure along with risks/complications discussed.  All questions answered.  Elevate foot above heart level.     Alyson Locket. Alvino Blood For Rodell Perna MD [facility] orthopedics  [phone]

## 2014-08-31 NOTE — Interval H&P Note (Signed)
History and Physical Interval Note:  08/31/2014 5:54 PM  Paul [name].  has presented today for surgery, with the diagnosis of Right Ankle Fx  The various methods of treatment have been discussed with the patient and family. After consideration of risks, benefits and other options for treatment, the patient has consented to  Procedure(s): OPEN REDUCTION INTERNAL FIXATION (ORIF) ANKLE FRACTURE (Right) as a surgical intervention .  The patient's history has been reviewed, patient examined, no change in status, stable for surgery.  I have reviewed the patient's chart and labs.  Questions were answered to the patient's satisfaction.     YATES,MARK C

## 2014-08-31 NOTE — Transfer of Care (Signed)
Immediate Anesthesia Transfer of Care Note  Patient: Paul [name].  Procedure(s) Performed: Procedure(s): OPEN REDUCTION INTERNAL FIXATION (ORIF) ANKLE FRACTURE (Right)  Patient Location: PACU  Anesthesia Type:GA combined with regional for post-op pain  Level of Consciousness: awake, alert  and oriented  Airway & Oxygen Therapy: Patient Spontanous Breathing and Patient connected to nasal cannula oxygen  Post-op Assessment: Report given to RN and Post -op Vital signs reviewed and stable  Post vital signs: Reviewed and stable  Last Vitals:  Filed Vitals:   08/31/14 1810  BP: 105/62  Pulse: 74  Temp:   Resp: 18    Complications: No apparent anesthesia complications

## 2014-08-31 NOTE — Progress Notes (Signed)
Attempted report x1. 

## 2014-08-31 NOTE — Progress Notes (Signed)
PROGRESS NOTE  Paul [name]. VHQ:[id number] DOB: [birth year] DOA: 08/30/2014 PCP: Caralyn Guile, DO   HPI: Paul [name] is a 74 y.o. male with cardiomyopathy, implantable cardiac defibrillator battery depletion, ejection fraction, mitral regurgitation, pulmonary hypertension, COPD, hypertension, dyslipidemia, BPH, Left bundle branch block, CAD, and syncope who presented to the hospital with complaint of fall with right ankle pain. Patient states that he was standing in the kitchen when suddenly his legs were shaking and gave out from underneath him and he fell on the ground. Denies loss of consciousness. He states he's been falling a lot lately due to dizziness but denies syncope. Took 2 tramadol at home but continued to have severe right ankle pain. Denies hitting his head on the floor. Denies chest pain or shortness of breath. No nausea, vomiting, or diarrhea.  In the ED, x-ray of right ankle shows fractures of distal fibula and lateral tibial metaphysis. Ortho was called at [facility], who recommended to transfer the patient to [facility].  Subjective / 24 H Interval events Persistent moderate right ankle pain needing fentanyl every 2 hours. Denies chest pain or shortness of breath. No nausea.   Assessment/Plan: Active Problems:   Mitral regurgitation   Hypertension   CAD (coronary artery disease)   Ankle fracture   Ankle fracture from fall - Appreciate ortho consult - I have asked cardiology to see as well given extensive cardiac history pre-op. Appreciate input. Also, may need outpatient Lasix and Lisinopril given hypotension and renal failure.   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 Echo ordered today to reassess LV systolic function. - on coreg, was lowered to 6.25 due to soft BP  Implantable cardioverter defibrillator  - interrogate per cardiology    Hypertension - BP intermittently soft still - monitor closely  Coronary artery disease - stable, no chest pain - on lipitor and aspirin  Acute kidney injury - pre-renal/dehydration suspected - creatinine has improved from 3.29 to 2.30 with hydration; most recent prior creatinine in 2013 was 1 so not entirely sure about baseline in the past 1-2 years. Continue NS at 75 cc/hr - careful not to over-hydrate. D/c fluids in am - hold lasix and lisinopril   Migraines - on fioricet 50-325-40 mg  COPD with ongoing tobacco abuse - active smoker, 1/2-3/4 ppd   Diet: Diet NPO time specified Fluids: RAC-saline at 30 ccs DVT Prophylaxis: Lovenox   Code Status: Full Code Family Communication: no family at bedside Disposition Plan: remain inpatient. PT eval post op  Consultants: - Cardiology - Orthopedic surgery   Procedures: - Open Reduction Internal Fixation, Dr. Lorin Mercy, scheduled for tonight 7/28  Antibiotics - None  Anti-infectives    None       Studies  Dg Ankle Complete Right  08/30/2014   CLINICAL DATA:  Pain following fall  EXAM: RIGHT ANKLE - COMPLETE 3+ VIEW  COMPARISON:  None.  FINDINGS: Frontal, oblique, and lateral views were obtained. There is an obliquely oriented fracture of the distal fibular diaphysis with mild overlapping of fracture fragments. There is a fracture arising from the lateral aspect of the distal fibular metaphysis with this fragment in the distal tibiofibular syndesmosis. There is gross ankle mortise disruption.  There is a spur off the inferior calcaneus. There is also a spur arising from the dorsal proximal navicular.  IMPRESSION: Fractures of the distal fibula and lateral tibial metaphysis with gross ankle  mortise disruption.   Electronically Signed   By: Lowella Grip III M.D.   On: 08/30/2014 19:46   Dg Chest Port 1 View  08/30/2014   CLINICAL DATA:  Weakness and frequent falls  EXAM: PORTABLE CHEST - 1 VIEW  COMPARISON:  11/18/2006   FINDINGS: A single AP portable view of the chest demonstrates no focal airspace consolidation or alveolar edema. The lungs are grossly clear. There is no large effusion or pneumothorax. Cardiac and mediastinal contours appear unremarkable.  IMPRESSION: No active disease.   Electronically Signed   By: Andreas Newport M.D.   On: 08/30/2014 21:31    Objective  Filed Vitals:   08/31/14 0000 08/31/14 0052 08/31/14 0200 08/31/14 1025  BP: '84/60 93/68 99/47 '$ 106/56  Pulse: 78 75 83 67  Temp:   98.5 F (36.9 C)   TempSrc:   Oral   Resp: '12 18 18   '$ Height:      Weight:      SpO2: 83% 100% 100%     Intake/Output Summary (Last 24 hours) at 08/31/14 1105 Last data filed at 08/31/14 1031  Gross per 24 hour  Intake 606.25 ml  Output   1000 ml  Net -393.75 ml   Filed Weights   08/30/14 1927  Weight: 73.936 kg (163 lb)    Exam:  General:  Patient is well-developed, lying supine on bed with legs raised above heart level, in no acute distress.   HEENT: Normocephalic, atraumatic, PERRL, conjunctivae normal  Cardiovascular: RRR without m/r/g, S1 and S2 normal  Respiratory: CTA biL, good air movement, no wheezing, no crackles, no rales  Abdomen: soft, non-tender, non-distended, +BS, no guarding  MSK/Extremities: Right ankle wrapped - not visualized on exam.  Skin: no rashes  Neuro: alert and oriented x 3. CN II-XII grossly intact, no focal motor deficit, sensory intact to light touch bilaterally.  Data Reviewed: Basic Metabolic Panel:  Recent Labs Lab 08/30/14 2043 08/31/14 0433  NA 136 137  K 5.0 3.8  CL 102 107  CO2 21* 23  GLUCOSE 103* 108*  BUN 50* 41*  CREATININE 3.29* 2.30*  CALCIUM 9.0 8.4*   Liver Function Tests:  Recent Labs Lab 08/31/14 0433  AST 14*  ALT 9*  ALKPHOS 31*  BILITOT 0.5  PROT 6.3*  ALBUMIN 3.3*   CBC:  Recent Labs Lab 08/30/14 2043 08/31/14 0433  WBC 11.2* 7.6  NEUTROABS 8.2*  --   HGB 12.1* 10.2*  HCT 34.8* 29.3*  MCV 88.5  88.3  PLT 228 182   Cardiac Enzymes:  Recent Labs Lab 08/30/14 2043  TROPONINI <0.03   BNP (last 3 results)  Recent Labs  08/30/14 2043  BNP 19.0    Scheduled Meds: . aspirin  325 mg Oral Daily  . atorvastatin  80 mg Oral Daily  . carvedilol  12.5 mg Oral BID WC  . divalproex  1,500 mg Oral QHS  . enoxaparin (LOVENOX) injection  30 mg Subcutaneous Daily  . finasteride  5 mg Oral Daily  . folic acid  1 mg Oral Daily  . gabapentin  300-600 mg Oral QHS  . mirtazapine  30 mg Oral QHS  . pantoprazole  40 mg Oral Daily   Continuous Infusions: . sodium chloride 75 mL/hr (08/31/14 0222)   Time spent: 25 minutes  Lenda Kelp, PA-S  Marzetta Board, MD [facility] Pager [phone]. If 7 PM - 7 AM, please contact night-coverage at [email or web address], password [facility] 08/31/2014, 11:05 AM  LOS: 1 day

## 2014-08-31 NOTE — Progress Notes (Signed)
Received report from [place], [place].

## 2014-08-31 NOTE — Progress Notes (Signed)
[facility] notified of telemetry bed request. Patient will be placed on telemetry. Nursing will continue to monitor.

## 2014-08-31 NOTE — Progress Notes (Signed)
Central telemetry called to report pulse rate in the 30's.  Patient was assessed there was a cardio tech in the room who alerted to me that she had been testing his ICD and that was the reason that he was brady.  Philis Fendt RN-BSN 08/31/14 1552

## 2014-08-31 NOTE — Brief Op Note (Signed)
08/30/2014 - 08/31/2014  7:17 PM  PATIENT:  Paul [name].  74 y.o. male  PRE-OPERATIVE DIAGNOSIS:  Right Ankle Fracture with Syndesmosis Injury  POST-OPERATIVE DIAGNOSIS:  Right Ankle Fracture with Syndesmosis Injury  PROCEDURE:  Procedure(s): OPEN REDUCTION INTERNAL FIXATION (ORIF) ANKLE FRACTURE (Right) ORIF lateral malleolus and ORIF syndosmosis  SURGEON:  Surgeon(s) and Role:    * Marybelle Killings, MD - Primary  PHYSICIAN ASSISTANT:   ASSISTANTS: none   ANESTHESIA:   regional and general  EBL:     BLOOD ADMINISTERED:none  DRAINS: none   LOCAL MEDICATIONS USED:  NONE  SPECIMEN:  No Specimen  DISPOSITION OF SPECIMEN:  N/A  COUNTS:  YES  TOURNIQUET:   Total Tourniquet Time Documented: Thigh (Right) - 22 minutes Total: Thigh (Right) - 22 minutes   DICTATION: .Other Dictation: Dictation Number [id number]  PLAN OF CARE: already inpatient  PATIENT DISPOSITION:  PACU - hemodynamically stable.   Delay start of Pharmacological VTE agent (>24hrs) due to surgical blood loss or risk of bleeding: yes

## 2014-08-31 NOTE — Anesthesia Preprocedure Evaluation (Addendum)
Anesthesia Evaluation  Patient identified by MRN, date of birth, ID band Patient awake    Reviewed: Allergy & Precautions, NPO status , Patient's Chart, lab work & pertinent test results  Airway Mallampati: I  TM Distance: >3 FB Neck ROM: Full    Dental  (+) Poor Dentition, Dental Advisory Given   Pulmonary Current Smoker,  breath sounds clear to auscultation        Cardiovascular hypertension, Pt. on medications and Pt. on home beta blockers + CAD and +CHF + dysrhythmias Rhythm:Regular Rate:Normal     Neuro/Psych    GI/Hepatic   Endo/Other    Renal/GU      Musculoskeletal   Abdominal   Peds  Hematology   Anesthesia Other Findings Defibrillator interrogated. Will leave as is due to surgery being far away from device,   Reproductive/Obstetrics                            Anesthesia Physical Anesthesia Plan  ASA: III  Anesthesia Plan: General and Regional   Post-op Pain Management:    Induction: Intravenous  Airway Management Planned: LMA  Additional Equipment:   Intra-op Plan:   Post-operative Plan: Extubation in OR  Informed Consent: I have reviewed the patients History and Physical, chart, labs and discussed the procedure including the risks, benefits and alternatives for the proposed anesthesia with the patient or authorized representative who has indicated his/her understanding and acceptance.   Dental advisory given  Plan Discussed with: CRNA, Anesthesiologist and Surgeon  Anesthesia Plan Comments:         Anesthesia Quick Evaluation

## 2014-08-31 NOTE — Anesthesia Postprocedure Evaluation (Signed)
  Anesthesia Post-op Note  Patient: Paul [name].  Procedure(s) Performed: Procedure(s): OPEN REDUCTION INTERNAL FIXATION (ORIF) ANKLE FRACTURE (Right)  Patient Location: PACU  Anesthesia Type:GA combined with regional for post-op pain  Level of Consciousness: awake, alert  and oriented  Airway and Oxygen Therapy: Patient Spontanous Breathing  Post-op Pain: none  Post-op Assessment: Post-op Vital signs reviewed LLE Motor Response: Responds to commands, Purposeful movement LLE Sensation: No numbness, No tingling RLE Motor Response: Purposeful movement, No tremor, Responds to commands RLE Sensation: Full sensation, Pain, No numbness, No tingling      Post-op Vital Signs: Reviewed  Last Vitals:  Filed Vitals:   08/31/14 2009  BP: 131/76  Pulse: 74  Temp:   Resp: 12    Complications: No apparent anesthesia complications

## 2014-08-31 NOTE — Progress Notes (Signed)
crna at bedside.

## 2014-09-01 ENCOUNTER — Encounter (HOSPITAL_COMMUNITY): Payer: Self-pay | Admitting: Orthopaedic Surgery

## 2014-09-01 DIAGNOSIS — I10 Essential (primary) hypertension: Secondary | ICD-10-CM

## 2014-09-01 LAB — CBC
HCT: 32.6 % — ABNORMAL LOW (ref 39.0–52.0)
Hemoglobin: 11.3 g/dL — ABNORMAL LOW (ref 13.0–17.0)
MCH: 30.7 pg (ref 26.0–34.0)
MCHC: 34.7 g/dL (ref 30.0–36.0)
MCV: 88.6 fL (ref 78.0–100.0)
PLATELETS: 184 10*3/uL (ref 150–400)
RBC: 3.68 MIL/uL — AB (ref 4.22–5.81)
RDW: 13.6 % (ref 11.5–15.5)
WBC: 7.8 10*3/uL (ref 4.0–10.5)

## 2014-09-01 LAB — BASIC METABOLIC PANEL
ANION GAP: 6 (ref 5–15)
BUN: 15 mg/dL (ref 6–20)
CALCIUM: 8.9 mg/dL (ref 8.9–10.3)
CO2: 24 mmol/L (ref 22–32)
CREATININE: 1.36 mg/dL — AB (ref 0.61–1.24)
Chloride: 109 mmol/L (ref 101–111)
GFR calc non Af Amer: 50 mL/min — ABNORMAL LOW (ref >60)
GFR, EST AFRICAN AMERICAN: 58 mL/min — AB (ref >60)
Glucose, Bld: 97 mg/dL (ref 65–99)
Potassium: 4.5 mmol/L (ref 3.5–5.1)
Sodium: 139 mmol/L (ref 135–145)

## 2014-09-01 NOTE — Progress Notes (Signed)
Full cardiology consultation was done yesterday. The patient had surgery yesterday. Two-dimensional echo has been ordered but data is not available yet.  Daryel November, MD

## 2014-09-01 NOTE — Clinical Social Work Placement (Signed)
   CLINICAL SOCIAL WORK PLACEMENT  NOTE  Date:  09/01/2014  Patient Details  Name: Paul [name]. MRN: [id number] Date of Birth: 1940/10/21  Clinical Social Work is seeking post-discharge placement for this patient at the [place] level of care (*CSW will initial, date and re-position this form in  chart as items are completed):  Yes   Patient/family provided with [place] Work Department's list of facilities offering this level of care within the geographic area requested by the patient (or if unable, by the patient's family).  Yes   Patient/family informed of their freedom to choose among providers that offer the needed level of care, that participate in Medicare, Medicaid or managed care program needed by the patient, have an available bed and are willing to accept the patient.  Yes   Patient/family informed of [facility]'s ownership interest in [facility] and [facility], as well as of the fact that they are under no obligation to receive care at these facilities.  PASRR submitted to EDS on 09/01/14     PASRR number received on 09/01/14     Existing PASRR number confirmed on  (n/a)     FL2 transmitted to all facilities in geographic area requested by pt/family on 09/01/14     FL2 transmitted to all facilities within larger geographic area on  (n/a)     Patient informed that his/her managed care company has contracts with or will negotiate with certain facilities, including the following:   (yes, [facility])         Patient/family informed of bed offers received.  Patient chooses bed at       Physician recommends and patient chooses bed at      Patient to be transferred to   on  .  Patient to be transferred to facility by       Patient family notified on   of transfer.  Name of family member notified:        PHYSICIAN Please sign FL2     Additional Comment:     _______________________________________________ Caroline Sauger, LCSW 09/01/2014, 3:22 PM [phone]

## 2014-09-01 NOTE — Progress Notes (Signed)
PROGRESS NOTE  Paul [name]. MEQ:[id number] DOB: 12/18/1940 DOA: 08/30/2014 PCP: Caralyn Guile, DO  HPI: Paul [name] is a 74 y.o. male with cardiomyopathy, implantable cardiac defibrillator battery depletion, ejection fraction, mitral regurgitation, pulmonary hypertension, COPD, hypertension, dyslipidemia, BPH, Left bundle branch block, CAD, and syncope who presented to the hospital with complaint of fall with right ankle pain. Patient states that he was standing in the kitchen when suddenly his legs were shaking and gave out from underneath him and he fell on the ground. Denies loss of consciousness. He states he's been falling a lot lately due to dizziness but denies syncope. Took 2 tramadol at home but continued to have severe right ankle pain. Denies hitting his head on the floor. Denies chest pain or shortness of breath. No nausea, vomiting, or diarrhea. In the ED, x-ray of right ankle shows fractures of distal fibula and lateral tibial metaphysis. Ortho was called at [facility], who recommended to transfer the patient to [facility].  Subjective / 24 H Interval events - did well with surgery last night, no pain overnight or this morning - no chest pain, shortness of breath, no abdominal pain, nausea or vomiting.   Assessment/Plan: Active Problems:   Mitral regurgitation   Smoking   Hypertension   CAD (coronary artery disease)   Ankle fracture   Ankle fracture, bimalleolar, closed   AKI (acute kidney injury)   Chronic systolic CHF (congestive heart failure)   Other specified hypotension   Ankle fracture from fall - Appreciate ortho consult - s/p open reduction and internal fixation right ankle, lateral malleolus, ORIF syndesmosis on 21-Sep-2022 by Dr. 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 Echo ordered today to  reassess LV systolic function. - on coreg, was lowered to 6.25 due to soft BP  Implantable cardioverter defibrillator  - interrogate per cardiology   Hypertension - BP no longer soft  Coronary artery disease  - stable, no chest pain  - on lipitor and aspirin   Acute kidney injury - pre-renal/dehydration suspected - improved with IVF, discontinue fluids today to prevent overload, continue to hold Lasix/Lisinopril  Migraines - on fioricet 50-325-40 mg  COPD with ongoing tobacco abuse - active smoker, 1/2-3/4 ppd   Diet: Diet regular Room service appropriate?: Yes; Fluid consistency:: Thin Fluids: none DVT Prophylaxis: Lovenox   Code Status: Full Code Family Communication: no family at bedside Disposition Plan: remain inpatient. PT eval post op  Consultants: - Cardiology - Orthopedic surgery   Procedures: - open reduction and internal fixation right ankle, lateral malleolus, ORIF syndesmosis on 2022/09/21 by Dr. Lorin Mercy  Antibiotics - None  Anti-infectives    None       Studies  Dg Ankle 2 Views Right  09/21/14   CLINICAL DATA:  Right ankle fracture.  EXAM: DG C-ARM 61-120 MIN; RIGHT ANKLE - 2 VIEW  COMPARISON:  Radiographs dated 08/30/2014  FINDINGS: C-arm images demonstrate that the patient has undergone open reduction and internal fixation of the distal fibula fracture. Disruption of the distal tibiofibular syndesmosis has been corrected with a screw across the tibia and fibula. Ankle mortise has been restored.  IMPRESSION: Open reduction and internal fixation of the fibula fracture.   Electronically Signed   By: Lorriane Shire M.D.   On: 09-21-2014 19:27   Dg Ankle Complete Right  08/30/2014  CLINICAL DATA:  Pain following fall  EXAM: RIGHT ANKLE - COMPLETE 3+ VIEW  COMPARISON:  None.  FINDINGS: Frontal, oblique, and lateral views were obtained. There is an obliquely oriented fracture of the distal fibular diaphysis with mild overlapping of fracture fragments. There  is a fracture arising from the lateral aspect of the distal fibular metaphysis with this fragment in the distal tibiofibular syndesmosis. There is gross ankle mortise disruption.  There is a spur off the inferior calcaneus. There is also a spur arising from the dorsal proximal navicular.  IMPRESSION: Fractures of the distal fibula and lateral tibial metaphysis with gross ankle mortise disruption.   Electronically Signed   By: Lowella Grip III M.D.   On: 08/30/2014 19:46   Dg Chest Port 1 View  08/30/2014   CLINICAL DATA:  Weakness and frequent falls  EXAM: PORTABLE CHEST - 1 VIEW  COMPARISON:  11/18/2006  FINDINGS: A single AP portable view of the chest demonstrates no focal airspace consolidation or alveolar edema. The lungs are grossly clear. There is no large effusion or pneumothorax. Cardiac and mediastinal contours appear unremarkable.  IMPRESSION: No active disease.   Electronically Signed   By: Andreas Newport M.D.   On: 08/30/2014 21:31   Dg C-arm 1-60 Min  08/31/2014   CLINICAL DATA:  Right ankle fracture.  EXAM: DG C-ARM 61-120 MIN; RIGHT ANKLE - 2 VIEW  COMPARISON:  Radiographs dated 08/30/2014  FINDINGS: C-arm images demonstrate that the patient has undergone open reduction and internal fixation of the distal fibula fracture. Disruption of the distal tibiofibular syndesmosis has been corrected with a screw across the tibia and fibula. Ankle mortise has been restored.  IMPRESSION: Open reduction and internal fixation of the fibula fracture.   Electronically Signed   By: Lorriane Shire M.D.   On: 08/31/2014 19:27    Objective  Filed Vitals:   08/31/14 2009 08/31/14 2152 09/01/14 0127 09/01/14 1300  BP: 131/76 140/76 132/76 107/66  Pulse: 74 81 80 90  Temp:  98.1 F (36.7 C) 98 F (36.7 C) 98.8 F (37.1 C)  TempSrc:  Oral Oral   Resp: '12 15 16 16  '$ Height:      Weight:      SpO2: 97% 98% 98% 96%    Intake/Output Summary (Last 24 hours) at 09/01/14 1613 Last data filed at  09/01/14 1300  Gross per 24 hour  Intake   1250 ml  Output   1950 ml  Net   -700 ml   Filed Weights   08/30/14 1927  Weight: 73.936 kg (163 lb)    Exam:  General:  NAD  HEENT: Normocephalic, atraumatic  Cardiovascular: RRR without m/r/g, S1 and S2 normal  Respiratory: CTA biL, good air movement, no wheezing, no crackles, no rales  Abdomen: soft, non-tender, non-distended, +BS, no guarding  MSK/Extremities: Right ankle wrapped - not visualized on exam.  Neuro: non focal  Data Reviewed: Basic Metabolic Panel:  Recent Labs Lab 08/30/14 2043 08/31/14 0433 09/01/14 0420  NA 136 137 139  K 5.0 3.8 4.5  CL 102 107 109  CO2 21* 23 24  GLUCOSE 103* 108* 97  BUN 50* 41* 15  CREATININE 3.29* 2.30* 1.36*  CALCIUM 9.0 8.4* 8.9   Liver Function Tests:  Recent Labs Lab 08/31/14 0433  AST 14*  ALT 9*  ALKPHOS 31*  BILITOT 0.5  PROT 6.3*  ALBUMIN 3.3*   CBC:  Recent Labs Lab 08/30/14 2043 08/31/14 0433 09/01/14 0420  WBC 11.2*  7.6 7.8  NEUTROABS 8.2*  --   --   HGB 12.1* 10.2* 11.3*  HCT 34.8* 29.3* 32.6*  MCV 88.5 88.3 88.6  PLT 228 182 184   Cardiac Enzymes:  Recent Labs Lab 08/30/14 2043  TROPONINI <0.03   BNP (last 3 results)  Recent Labs  08/30/14 2043  BNP 19.0    Scheduled Meds: . aspirin  325 mg Oral Daily  . atorvastatin  80 mg Oral Daily  . carvedilol  6.25 mg Oral BID WC  . divalproex  1,500 mg Oral QHS  . docusate sodium  100 mg Oral BID  . enoxaparin (LOVENOX) injection  30 mg Subcutaneous Q24H  . finasteride  5 mg Oral Daily  . folic acid  1 mg Oral Daily  . gabapentin  300-600 mg Oral QHS  . mirtazapine  30 mg Oral QHS  . pantoprazole  40 mg Oral Daily   Continuous Infusions: . sodium chloride Stopped (08/31/14 1830)    Marzetta Board, MD [facility] Pager [phone]. If 7 PM - 7 AM, please contact night-coverage at [email or web address], password [facility] 09/01/2014, 4:13 PM  LOS: 2 days

## 2014-09-01 NOTE — Progress Notes (Signed)
Call cardio echo lab for an estimated time of procedure, left message, no return call this shift.

## 2014-09-01 NOTE — Progress Notes (Signed)
OT Cancellation Note  Patient Details Name: Paul [name]. MRN: [id number] DOB: Dec 18, 1940   Cancelled Treatment:    Reason Eval/Treat Not Completed: Other (comment) Pt is Medicare and current D/C plan is SNF. No apparent immediate acute care OT needs, therefore will defer OT to SNF. If OT eval is needed please call Acute Rehab Dept. at [phone] or text page OT at [phone].    Almon Register [phone] 09/01/2014, 12:32 PM

## 2014-09-01 NOTE — Op Note (Signed)
NAMEMEDARD, DECUIR                ACCOUNT NO.:  [id number]  MEDICAL RECORD NO.:  [id number]  LOCATION:  5N11C                        FACILITY:  [facility]  PHYSICIAN:  Jaila Schellhorn C. Lorin Mercy, M.D.    DATE OF BIRTH:  Aug 16, 1940  DATE OF PROCEDURE:  08/31/2014 DATE OF DISCHARGE:                              OPERATIVE REPORT   PREOPERATIVE DIAGNOSIS:  Lateral malleolar fracture, deltoid ligament tear, ankle dislocation with syndesmotic injury.  POSTOPERATIVE DIAGNOSIS:  Lateral malleolar fracture, deltoid ligament tear, ankle dislocation with syndesmotic injury.  PROCEDURE:  Open reduction and internal fixation right ankle, lateral Malleolus,   ORIF  syndesmosis.  SURGEON:  Brent Noto C. Lorin Mercy, MD  TOURNIQUET TIME:  Twenty minutes.  PROCEDURE IN DETAIL:  After induction of general anesthesia, orotracheal intubation, time-out procedure, preoperative popliteal block was given and LMA tube was placed.  Proximal thigh tourniquet prepping with DuraPrep.  The patient had some medial fracture blisters from the fracture dislocation.  Ancef was given prophylactically.  Leg was elevated.  After a time-out, wrapped with an Esmarch, tourniquet inflated.  Sterile glove over the toes.  Sterile skin marker laterally over the fibula with palpation of the fibula.  Incision was made, subperiosteal dissection, distraction of the fracture with Lobster claw clamps, reduction, self-retaining retractor holding it and 8-holed, 1/3rd tubular titanium plate DePuy was selected, applied, held in place and screws were filled.  Second screw from the bottom was used for the syndesmosis with the ankle in neutral position, pressure with the thumb and middle finger and index finger from between the medial and lateral malleoli.  The screw was tightened down securely bicortical, which was the cancellous lag screw squeezing the syndesmosis with complete closure of the medial clear space.  I closed dorsiflex, plantar flex, checked  AP and lateral with good position of all screws.  There were 5 bicortical screws above the fracture caught through the fracture and the distal most screw was unicortical cancellous.  Good reduction of the joint. Tourniquet was deflated.  Wound was irrigated.  2-0 Vicryl was used in closure followed by skin staples.  Postop dressing was pieces of Xeroform over the medial blisters, 4x4s, x-ray, ABDs, Webril, and short- leg splint.  The patient tolerated the procedure well.     Dacota Devall C. Lorin Mercy, M.D.     MCY/MEDQ  D:  08/31/2014  T:  09/01/2014  Job:  [id number]

## 2014-09-01 NOTE — Clinical Social Work Note (Signed)
Clinical Social Work Assessment  Patient Details  Name: Paul [name]. MRN: [id number] Date of Birth: [birth year]  Date of referral:  09/01/14               Reason for consult:  Facility Placement, Discharge Planning                Permission sought to share information with:  Chartered certified accountant granted to share information::  Yes, Verbal Permission Granted  Name::     n/a  Agency::  [facility] SNF  Relationship::  n/a  Contact Information:  n/a  Housing/Transportation Living arrangements for the past 2 months:  [place] of Information:  Patient Patient Interpreter Needed:  None Criminal Activity/Legal Involvement Pertinent to Current Situation/Hospitalization:  No - Comment as needed Significant Relationships:  Friend Lives with:  Self Do you feel safe going back to the place where you live?  Yes Need for family participation in patient care:  No (Coment) (Patient able to make own decisions.)  Care giving concerns:  Patient expressed no concerns at this time.   Social Worker assessment / plan:  CSW received referral for possible SNF placement at time of discharge. CSW met with patient at bedside to discuss discharge disposition. Patient expressed understanding of PT recommendation, but would prefer to discharge home at time of discharge. Per patient, patient lives alone but has a friend who is able to assist with patient at home. Patient agreeable to CSW completing SNF search. CSW to continue to follow and assist with discharge planning needs.  Employment status:  Retired Forensic scientist:  Medicare PT Recommendations:  [place] / Referral to community resources:  [place]  Patient/Family's Response to care:  Patient understanding and agreeable to CSW plan of care.  Patient/Family's Understanding of and Emotional Response to Diagnosis, Current Treatment, and Prognosis:  Patient  understanding and agreeable to CSW plan of care.  Emotional Assessment Appearance:  Appears stated age Attitude/Demeanor/Rapport:  Other (Pleasant.) Affect (typically observed):  Pleasant, Accepting, Appropriate, Quiet Orientation:  Oriented to Self, Oriented to Place, Oriented to  Time, Oriented to Situation Alcohol / Substance use:  Not Applicable Psych involvement (Current and /or in the community):  No (Comment) (Not appropriate on this admission.)  Discharge Needs  Concerns to be addressed:  No discharge needs identified Readmission within the last 30 days:  No Current discharge risk:  None Barriers to Discharge:  No Barriers Identified   Caroline Sauger, LCSW 09/01/2014, 3:19 PM [phone]

## 2014-09-01 NOTE — Evaluation (Signed)
Physical Therapy Evaluation Patient Details Name: Paul [name]. MRN: [id number] DOB: 12-27-1940 Today's Date: 09/01/2014   History of Present Illness  Lateral malleolar fracture, deltoid ligament tear, ankle dislocation with syndesmotic injury. s/p ORIF Rt ankle.  Clinical Impression  Patient is s/p above surgery resulting in functional limitations due to the deficits listed below (see PT Problem List). Patient will benefit from skilled PT to increase their independence and safety with mobility to allow discharge to the venue listed below.       Follow Up Recommendations SNF    Equipment Recommendations  None recommended by PT;Other (comment) (Reports having walker at home. )    Recommendations for Other Services       Precautions / Restrictions Precautions Precautions: Fall Precaution Comments: elevate Rt LE Restrictions Weight Bearing Restrictions: Yes RLE Weight Bearing: Non weight bearing      Mobility  Bed Mobility Overal bed mobility: Needs Assistance Bed Mobility: Supine to Sit     Supine to sit: Supervision        Transfers Overall transfer level: Needs assistance Equipment used: Rolling walker (2 wheeled) Transfers: Sit to/from Stand Sit to Stand: Min assist (cues for safe rw use)            Ambulation/Gait Ambulation/Gait assistance: Min assist Ambulation Distance (Feet): 3 Feet Assistive device: Rolling walker (2 wheeled) Gait Pattern/deviations:  (hop-to pattern) Gait velocity: slow   General Gait Details: Needing cues throughout for safety and use of walker  Stairs            Wheelchair Mobility    Modified Rankin (Stroke Patients Only)       Balance Overall balance assessment: Needs assistance Sitting-balance support: No upper extremity supported Sitting balance-Leahy Scale: Fair     Standing balance support: Bilateral upper extremity supported Standing balance-Leahy Scale: Poor                                Pertinent Vitals/Pain Pain Assessment: 0-10 Pain Score: 8  Pain Location: Rt ankle Pain Descriptors / Indicators: Aching Pain Intervention(s): Monitored during session;RN gave pain meds during session    Home Living Family/patient expects to be discharged to:: Skilled nursing facility Living Arrangements: Alone               Additional Comments: Reports having one step into home.     Prior Function Level of Independence: Independent         Comments: History of several falls over the last year or so.      Hand Dominance        Extremity/Trunk Assessment               Lower Extremity Assessment: Generalized weakness         Communication   Communication: No difficulties  Cognition Arousal/Alertness: Awake/alert Behavior During Therapy: WFL for tasks assessed/performed Overall Cognitive Status: Within Functional Limits for tasks assessed                      General Comments      Exercises        Assessment/Plan    PT Assessment Patient needs continued PT services  PT Diagnosis Difficulty walking;Generalized weakness   PT Problem List Decreased strength;Decreased range of motion;Decreased activity tolerance;Decreased balance;Decreased mobility;Decreased knowledge of use of DME  PT Treatment Interventions DME instruction;Gait training;Stair training;Functional mobility training;Therapeutic activities;Therapeutic exercise;Patient/family education   PT Goals (  Current goals can be found in the Care Plan section) Acute Rehab PT Goals Patient Stated Goal: Eventually go home PT Goal Formulation: With patient Time For Goal Achievement: 09/15/14 Potential to Achieve Goals: Good    Frequency Min 3X/week   Barriers to discharge        Co-evaluation               End of Session Equipment Utilized During Treatment: Gait belt Activity Tolerance: Patient tolerated treatment well Patient left: in chair;with call bell/phone within  reach;Other (comment) (Rt LE elevated) Nurse Communication: Mobility status         Time: 2111-5520 PT Time Calculation (min) (ACUTE ONLY): 27 min   Charges:   PT Evaluation $Initial PT Evaluation Tier I: 1 Procedure PT Treatments $Gait Training: 8-22 mins   PT G Codes:        Cassell Clement, PT, CSCS Pager [phone] Office [phone]  09/01/2014, 12:17 PM

## 2014-09-02 ENCOUNTER — Inpatient Hospital Stay (HOSPITAL_COMMUNITY): Payer: Medicare Other

## 2014-09-02 DIAGNOSIS — I509 Heart failure, unspecified: Secondary | ICD-10-CM

## 2014-09-02 LAB — BASIC METABOLIC PANEL
ANION GAP: 6 (ref 5–15)
BUN: 15 mg/dL (ref 6–20)
CO2: 23 mmol/L (ref 22–32)
Calcium: 8.6 mg/dL — ABNORMAL LOW (ref 8.9–10.3)
Chloride: 108 mmol/L (ref 101–111)
Creatinine, Ser: 1.32 mg/dL — ABNORMAL HIGH (ref 0.61–1.24)
GFR, EST AFRICAN AMERICAN: 60 mL/min — AB (ref >60)
GFR, EST NON AFRICAN AMERICAN: 51 mL/min — AB (ref >60)
Glucose, Bld: 137 mg/dL — ABNORMAL HIGH (ref 65–99)
POTASSIUM: 4 mmol/L (ref 3.5–5.1)
Sodium: 137 mmol/L (ref 135–145)

## 2014-09-02 NOTE — Progress Notes (Signed)
Subjective: 2 Days Post-Op Procedure(s) (LRB): OPEN REDUCTION INTERNAL FIXATION (ORIF) ANKLE FRACTURE (Right) Patient reports pain as mild to moderate.   Awake and alert. Objective: Vital signs in last 24 hours: Temp:  [98.8 F (37.1 C)-99.7 F (37.6 C)] 99.6 F (37.6 C) (07/30 0556) Pulse Rate:  [83-90] 86 (07/30 0556) Resp:  [15-16] 15 (07/30 0556) BP: (96-107)/(57-66) 99/61 mmHg (07/30 0556) SpO2:  [95 %-97 %] 95 % (07/30 0556)  Intake/Output from previous day: 07/29 0701 - 07/30 0700 In: 720 [P.O.:720] Out: 1050 [Urine:1050] Intake/Output this shift:     Recent Labs  08/30/14 2043 08/31/14 0433 09/01/14 0420  HGB 12.1* 10.2* 11.3*    Recent Labs  08/31/14 0433 09/01/14 0420  WBC 7.6 7.8  RBC 3.32* 3.68*  HCT 29.3* 32.6*  PLT 182 184    Recent Labs  09/01/14 0420 09/02/14 0407  NA 139 137  K 4.5 4.0  CL 109 108  CO2 24 23  BUN 15 15  CREATININE 1.36* 1.32*  GLUCOSE 97 137*  CALCIUM 8.9 8.6*   No results for input(s): LABPT, INR in the last 72 hours.   Right Lower extremity:  Sensation intact distally Incision: dressing C/D/I  Toes warm and dry   Assessment/Plan: 2 Days Post-Op Procedure(s) (LRB): OPEN REDUCTION INTERNAL FIXATION (ORIF) ANKLE FRACTURE (Right) Up with therapy  No weight bearing right lower extremity Elevation of foot and wiggling of toes encouraged  Paul [name] 09/02/2014, 9:33 AM

## 2014-09-02 NOTE — Progress Notes (Signed)
Patient ID: Paul Laskaris., male   DOB: 08/26/1940, 74 y.o.   MRN: [id number]     Subjective:    No cardiac symptoms  Objective:   Temp:  [98.8 F (37.1 C)-99.7 F (37.6 C)] 99.6 F (37.6 C) (07/30 0556) Pulse Rate:  [83-90] 86 (07/30 0556) Resp:  [15-16] 15 (07/30 0556) BP: (96-107)/(57-66) 99/61 mmHg (07/30 0556) SpO2:  [95 %-97 %] 95 % (07/30 0556) Last BM Date: 08/30/14  Filed Weights   08/30/14 1927  Weight: 163 lb (73.936 kg)    Intake/Output Summary (Last 24 hours) at 09/02/14 0959 Last data filed at 09/02/14 4098  Gross per 24 hour  Intake    480 ml  Output   1050 ml  Net   -570 ml    Telemetry: V-paced  Exam:  General: NAD  Resp: CTAB  Cardiac: RRR, no m/r/g, no JVD  GI: abdomen soft, NT, ND  MSK: no LE edema  Neuro: no focal deficits   Lab Results:  Basic Metabolic Panel:  Recent Labs Lab 08/31/14 0433 09/01/14 0420 09/02/14 0407  NA 137 139 137  K 3.8 4.5 4.0  CL 107 109 108  CO2 '23 24 23  '$ GLUCOSE 108* 97 137*  BUN 41* 15 15  CREATININE 2.30* 1.36* 1.32*  CALCIUM 8.4* 8.9 8.6*    Liver Function Tests:  Recent Labs Lab 08/31/14 0433  AST 14*  ALT 9*  ALKPHOS 31*  BILITOT 0.5  PROT 6.3*  ALBUMIN 3.3*    CBC:  Recent Labs Lab 08/30/14 2043 08/31/14 0433 09/01/14 0420  WBC 11.2* 7.6 7.8  HGB 12.1* 10.2* 11.3*  HCT 34.8* 29.3* 32.6*  MCV 88.5 88.3 88.6  PLT 228 182 184    Cardiac Enzymes:  Recent Labs Lab 08/30/14 2043  TROPONINI <0.03    BNP: No results for input(s): PROBNP in the last 8760 hours.  Coagulation: No results for input(s): INR in the last 168 hours.  ECG:   Medications:   Scheduled Medications: . aspirin  325 mg Oral Daily  . atorvastatin  80 mg Oral Daily  . carvedilol  6.25 mg Oral BID WC  . divalproex  1,500 mg Oral QHS  . docusate sodium  100 mg Oral BID  . enoxaparin (LOVENOX) injection  30 mg Subcutaneous Q24H  . finasteride  5 mg Oral Daily  . folic acid  1 mg Oral  Daily  . gabapentin  300-600 mg Oral QHS  . mirtazapine  30 mg Oral QHS  . pantoprazole  40 mg Oral Daily     Infusions:     PRN Medications:  acetaminophen **OR** acetaminophen, bisacodyl, butalbital-acetaminophen-caffeine, fentaNYL (SUBLIMAZE) injection, HYDROmorphone (DILAUDID) injection, metoCLOPramide **OR** metoCLOPramide (REGLAN) injection, ondansetron **OR** ondansetron (ZOFRAN) IV, oxyCODONE, polyethylene glycol, promethazine     Assessment/Plan    74 yo with history of COPD, CAD, and mixed ischemic/nonischemic cardiomyopathy with [employer] CRT-D fell with distal fibula/lateral tibial metaphysis fracture. Cardiology asked to consult pre-op.   1. Chronic systolic HF - last echo 1191 LVEF 50-55%, has repeat pending - presented hypovolemic, Cr 3.3. Trending down to 1.3 off IVF and off diuretic - medical therapy with coreg, ACE on hold due to AKI. Diuretics on hold. Soft bp's at times, continue to hold ACE and diuretic. At discharge would change to lasix '20mg'$  prn only. Pending bp's and renal function closer to discharge would start back lower dose of ACE-I.  - f/u repeat echo  2. Fall - no clear syncope. Clearly hypovolemic  on presentation, has had some soft bp's potential etiology - patient has CRT-D, normal device check this admit  3. Ankle fracture - POD day 2 ORIF   Will f/u echo results, no further inpatient cardiology recs at this time. Pending echo result will signoff   Carlyle Dolly, M.D.

## 2014-09-02 NOTE — Progress Notes (Signed)
PROGRESS NOTE  Paul [name]. JAS:[id number] DOB: [birth year] DOA: 08/30/2014 PCP: Caralyn Guile, DO  HPI: [name] is a 74 y.o. male with cardiomyopathy, implantable cardiac defibrillator battery depletion, ejection fraction, mitral regurgitation, pulmonary hypertension, COPD, hypertension, dyslipidemia, BPH, Left bundle branch block, CAD, and syncope who presented to the hospital with complaint of fall with right ankle pain. x-ray of right ankle shows fractures of distal fibula and lateral tibial metaphysis.   Subjective / 24 H Interval events - no chest pain, shortness of breath, no abdominal pain, nausea or vomiting.  - awaiting 2D echo for 2 days now  Assessment/Plan: Active Problems:   Mitral regurgitation   Smoking   Hypertension   CAD (coronary artery disease)   Ankle fracture   Ankle fracture, bimalleolar, closed   AKI (acute kidney injury)   Chronic systolic CHF (congestive heart failure)   Other specified hypotension   Ankle fracture from fall - Appreciate ortho consult - s/p open reduction and internal fixation right ankle, lateral malleolus, ORIF syndesmosis on 09-05-2022 by Dr. 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  - Echo ordered to reassess LV systolic function. - on coreg, was lowered to 6.25 due to soft BP - based on renal function and volume status will adjust Lasix and Lisinopril at discharge  Implantable cardioverter defibrillator  - interrogated per cardiology   Hypertension - BP no longer soft  Coronary artery disease  - stable, no chest pain  - on lipitor and aspirin   Acute kidney injury - pre-renal/dehydration suspected - improved with IVF, discontinue fluids today to prevent overload, continue to hold Lasix/Lisinopril  Migraines - on fioricet 50-325-40 mg  COPD with ongoing tobacco  abuse - active smoker, 1/2-3/4 ppd   Diet: Diet regular Room service appropriate?: Yes; Fluid consistency:: Thin Fluids: none DVT Prophylaxis: Lovenox   Code Status: Full Code Family Communication: no family at bedside Disposition Plan: remain inpatient. SNF once 2d echo complete   Consultants: - Cardiology - Orthopedic surgery   Procedures: - open reduction and internal fixation right ankle, lateral malleolus, ORIF syndesmosis on 2022/09/05 by Dr. Lorin Mercy  Antibiotics - None  Anti-infectives    None       Studies  Dg Ankle 2 Views Right  09-05-14   CLINICAL DATA:  Right ankle fracture.  EXAM: DG C-ARM 61-120 MIN; RIGHT ANKLE - 2 VIEW  COMPARISON:  Radiographs dated 08/30/2014  FINDINGS: C-arm images demonstrate that the patient has undergone open reduction and internal fixation of the distal fibula fracture. Disruption of the distal tibiofibular syndesmosis has been corrected with a screw across the tibia and fibula. Ankle mortise has been restored.  IMPRESSION: Open reduction and internal fixation of the fibula fracture.   Electronically Signed   By: Lorriane Shire M.D.   On: 09-05-2014 19:27   Dg C-arm 1-60 Min  09/05/2014   CLINICAL DATA:  Right ankle fracture.  EXAM: DG C-ARM 61-120 MIN; RIGHT ANKLE - 2 VIEW  COMPARISON:  Radiographs dated 08/30/2014  FINDINGS: C-arm images demonstrate that the patient has undergone open reduction and internal fixation of the distal fibula fracture. Disruption of the distal tibiofibular syndesmosis has been corrected with a screw across the tibia and fibula. Ankle mortise has been restored.  IMPRESSION: Open reduction and internal fixation of the fibula fracture.   Electronically Signed   By:  Lorriane Shire M.D.   On: 08/31/2014 19:27    Objective  Filed Vitals:   09/01/14 0127 09/01/14 1300 09/01/14 2133 09/02/14 0556  BP: 132/76 1'07/66 96/57 99/61 '$  Pulse: 80 90 83 86  Temp: 98 F (36.7 C) 98.8 F (37.1 C) 99.7 F (37.6 C) 99.6 F (37.6  C)  TempSrc: Oral  Oral Oral  Resp: '16 16 15 15  '$ Height:      Weight:      SpO2: 98% 96% 97% 95%    Intake/Output Summary (Last 24 hours) at 09/02/14 1246 Last data filed at 09/02/14 1100  Gross per 24 hour  Intake    720 ml  Output   1425 ml  Net   -705 ml   Filed Weights   08/30/14 1927  Weight: 73.936 kg (163 lb)    Exam:  General:  NAD  HEENT: Normocephalic, atraumatic  Cardiovascular: RRR without m/r/g, S1 and S2 normal  Respiratory: CTA biL, good air movement, no wheezing, no crackles, no rales  Abdomen: soft, non-tender, non-distended, +BS, no guarding  MSK/Extremities: Right ankle wrapped   Data Reviewed: Basic Metabolic Panel:  Recent Labs Lab 08/30/14 2043 08/31/14 0433 09/01/14 0420 09/02/14 0407  NA 136 137 139 137  K 5.0 3.8 4.5 4.0  CL 102 107 109 108  CO2 21* '23 24 23  '$ GLUCOSE 103* 108* 97 137*  BUN 50* 41* 15 15  CREATININE 3.29* 2.30* 1.36* 1.32*  CALCIUM 9.0 8.4* 8.9 8.6*   Liver Function Tests:  Recent Labs Lab 08/31/14 0433  AST 14*  ALT 9*  ALKPHOS 31*  BILITOT 0.5  PROT 6.3*  ALBUMIN 3.3*   CBC:  Recent Labs Lab 08/30/14 2043 08/31/14 0433 09/01/14 0420  WBC 11.2* 7.6 7.8  NEUTROABS 8.2*  --   --   HGB 12.1* 10.2* 11.3*  HCT 34.8* 29.3* 32.6*  MCV 88.5 88.3 88.6  PLT 228 182 184   Cardiac Enzymes:  Recent Labs Lab 08/30/14 2043  TROPONINI <0.03   BNP (last 3 results)  Recent Labs  08/30/14 2043  BNP 19.0    Scheduled Meds: . aspirin  325 mg Oral Daily  . atorvastatin  80 mg Oral Daily  . carvedilol  6.25 mg Oral BID WC  . divalproex  1,500 mg Oral QHS  . docusate sodium  100 mg Oral BID  . enoxaparin (LOVENOX) injection  30 mg Subcutaneous Q24H  . finasteride  5 mg Oral Daily  . folic acid  1 mg Oral Daily  . gabapentin  300-600 mg Oral QHS  . mirtazapine  30 mg Oral QHS  . pantoprazole  40 mg Oral Daily   Continuous Infusions:    Marzetta Board, MD [facility] Pager [phone].  If 7 PM - 7 AM, please contact night-coverage at [email or web address], password [facility] 09/02/2014, 12:46 PM  LOS: 3 days

## 2014-09-02 NOTE — Clinical Social Work Placement (Signed)
   CLINICAL SOCIAL WORK PLACEMENT  NOTE  Date:  09/02/2014  Patient Details  Name: Paul [name]. MRN: [id number] Date of Birth: [birth year]  Clinical Social Work is seeking post-discharge placement for this patient at the [place] level of care (*CSW will initial, date and re-position this form in  chart as items are completed):  Yes   Patient/family provided with [place] Work Department's list of facilities offering this level of care within the geographic area requested by the patient (or if unable, by the patient's family).  Yes   Patient/family informed of their freedom to choose among providers that offer the needed level of care, that participate in Medicare, Medicaid or managed care program needed by the patient, have an available bed and are willing to accept the patient.  Yes   Patient/family informed of [place]'s ownership interest in [facility] and [facility], as well as of the fact that they are under no obligation to receive care at these facilities.  PASRR submitted to EDS on 09/01/14     PASRR number received on 09/01/14     Existing PASRR number confirmed on  (n/a)     FL2 transmitted to all facilities in geographic area requested by pt/family on 09/01/14     FL2 transmitted to all facilities within larger geographic area on  (n/a)     Patient informed that his/her managed care company has contracts with or will negotiate with certain facilities, including the following:   (yes, [facility])     Yes   Patient/family informed of bed offers received.  Patient chooses bed at [facility]     Physician recommends and patient chooses bed at      Patient to be transferred to   on  .  Patient to be transferred to facility by       Patient family notified on   of transfer.  Name of family member notified:        PHYSICIAN Please sign FL2     Additional Comment:     _______________________________________________ Berton Mount, LCSWA Weekend CSW [phone]

## 2014-09-03 DIAGNOSIS — S82841D Displaced bimalleolar fracture of right lower leg, subsequent encounter for closed fracture with routine healing: Secondary | ICD-10-CM | POA: Diagnosis not present

## 2014-09-03 DIAGNOSIS — I10 Essential (primary) hypertension: Secondary | ICD-10-CM | POA: Diagnosis not present

## 2014-09-03 DIAGNOSIS — N179 Acute kidney failure, unspecified: Secondary | ICD-10-CM | POA: Diagnosis not present

## 2014-09-03 DIAGNOSIS — J449 Chronic obstructive pulmonary disease, unspecified: Secondary | ICD-10-CM | POA: Diagnosis not present

## 2014-09-03 DIAGNOSIS — I5022 Chronic systolic (congestive) heart failure: Secondary | ICD-10-CM | POA: Diagnosis not present

## 2014-09-03 DIAGNOSIS — I429 Cardiomyopathy, unspecified: Secondary | ICD-10-CM | POA: Diagnosis not present

## 2014-09-03 DIAGNOSIS — S82844D Nondisplaced bimalleolar fracture of right lower leg, subsequent encounter for closed fracture with routine healing: Secondary | ICD-10-CM | POA: Diagnosis not present

## 2014-09-03 DIAGNOSIS — W010XXD Fall on same level from slipping, tripping and stumbling without subsequent striking against object, subsequent encounter: Secondary | ICD-10-CM | POA: Diagnosis not present

## 2014-09-03 DIAGNOSIS — I2510 Atherosclerotic heart disease of native coronary artery without angina pectoris: Secondary | ICD-10-CM | POA: Diagnosis not present

## 2014-09-03 DIAGNOSIS — S82844E Nondisplaced bimalleolar fracture of right lower leg, subsequent encounter for open fracture type I or II with routine healing: Secondary | ICD-10-CM | POA: Diagnosis not present

## 2014-09-03 DIAGNOSIS — S9304XA Dislocation of right ankle joint, initial encounter: Secondary | ICD-10-CM | POA: Diagnosis not present

## 2014-09-03 DIAGNOSIS — N400 Enlarged prostate without lower urinary tract symptoms: Secondary | ICD-10-CM | POA: Diagnosis not present

## 2014-09-03 DIAGNOSIS — I340 Nonrheumatic mitral (valve) insufficiency: Secondary | ICD-10-CM | POA: Diagnosis not present

## 2014-09-03 LAB — BASIC METABOLIC PANEL
Anion gap: 9 (ref 5–15)
BUN: 18 mg/dL (ref 6–20)
CHLORIDE: 106 mmol/L (ref 101–111)
CO2: 23 mmol/L (ref 22–32)
Calcium: 8.6 mg/dL — ABNORMAL LOW (ref 8.9–10.3)
Creatinine, Ser: 1.29 mg/dL — ABNORMAL HIGH (ref 0.61–1.24)
GFR calc non Af Amer: 53 mL/min — ABNORMAL LOW (ref >60)
Glucose, Bld: 107 mg/dL — ABNORMAL HIGH (ref 65–99)
POTASSIUM: 4.3 mmol/L (ref 3.5–5.1)
SODIUM: 138 mmol/L (ref 135–145)

## 2014-09-03 LAB — CBC
HCT: 26.9 % — ABNORMAL LOW (ref 39.0–52.0)
Hemoglobin: 9.2 g/dL — ABNORMAL LOW (ref 13.0–17.0)
MCH: 30.5 pg (ref 26.0–34.0)
MCHC: 34.2 g/dL (ref 30.0–36.0)
MCV: 89.1 fL (ref 78.0–100.0)
PLATELETS: 178 10*3/uL (ref 150–400)
RBC: 3.02 MIL/uL — ABNORMAL LOW (ref 4.22–5.81)
RDW: 13.7 % (ref 11.5–15.5)
WBC: 7.5 10*3/uL (ref 4.0–10.5)

## 2014-09-03 MED ORDER — OXYCODONE HCL 5 MG PO TABS: 5.0000 mg | ORAL_TABLET | ORAL | Status: DC | PRN

## 2014-09-03 MED ORDER — FUROSEMIDE 40 MG PO TABS: 20.0000 mg | ORAL_TABLET | Freq: Every day | ORAL | Status: DC | PRN

## 2014-09-03 MED ORDER — CARVEDILOL 6.25 MG PO TABS: 6.2500 mg | ORAL_TABLET | Freq: Two times a day (BID) | ORAL | Status: DC

## 2014-09-03 MED ORDER — TRAMADOL HCL 50 MG PO TABS: 50.0000 mg | ORAL_TABLET | Freq: Three times a day (TID) | ORAL | Status: DC | PRN

## 2014-09-03 MED ORDER — POTASSIUM CHLORIDE CRYS ER 20 MEQ PO TBCR: 20.0000 meq | EXTENDED_RELEASE_TABLET | Freq: Every day | ORAL | Status: AC | PRN

## 2014-09-03 MED ORDER — ENOXAPARIN SODIUM 30 MG/0.3ML ~~LOC~~ SOLN: 30.0000 mg | SUBCUTANEOUS | Status: DC

## 2014-09-03 NOTE — Progress Notes (Signed)
Patient ID: Paul [name]., male   DOB: May 02, 1940, 74 y.o.   MRN: [id number] No acute changes.  Slow mobility.  Comfortable.  Splint clean and intact.  Moves toes easily.  Toes well perfused.  Likely skilled nursing tomorrow.  Likes alone and has one step to get up to into his house.

## 2014-09-03 NOTE — Progress Notes (Signed)
Called report to [place] at [facility].

## 2014-09-03 NOTE — Clinical Social Work Placement (Signed)
   CLINICAL SOCIAL WORK PLACEMENT  NOTE  Date:  09/03/2014  Patient Details  Name: Paul [name]. MRN: [id number] Date of Birth: 10-Nov-1940  Clinical Social Work is seeking post-discharge placement for this patient at the [place] level of care (*CSW will initial, date and re-position this form in  chart as items are completed):  Yes   Patient/family provided with [place] Work Department's list of facilities offering this level of care within the geographic area requested by the patient (or if unable, by the patient's family).  Yes   Patient/family informed of their freedom to choose among providers that offer the needed level of care, that participate in Medicare, Medicaid or managed care program needed by the patient, have an available bed and are willing to accept the patient.  Yes   Patient/family informed of [facility]'s ownership interest in [facility] and [facility], as well as of the fact that they are under no obligation to receive care at these facilities.  PASRR submitted to EDS on 09/01/14     PASRR number received on 09/01/14     Existing PASRR number confirmed on  (n/a)     FL2 transmitted to all facilities in geographic area requested by pt/family on 09/01/14     FL2 transmitted to all facilities within larger geographic area on  (n/a)     Patient informed that his/her managed care company has contracts with or will negotiate with certain facilities, including the following:   (yes, [facility])     Yes   Patient/family informed of bed offers received.  Patient chooses bed at [facility]     Physician recommends and patient chooses bed at      Patient to be transferred to   on  .  Patient to be transferred to facility by  09/03/14  Patient family notified on  09/03/14 of transfer.  Name of family member notified:        PHYSICIAN Please sign FL2     Additional Comment:     _______________________________________________ Christene Lye, LCSW 09/03/2014, 9:33 AM

## 2014-09-03 NOTE — Discharge Summary (Signed)
Physician Discharge Summary  Paul [name]. AJO:[id number] DOB: 1941/01/07 DOA: 08/30/2014  PCP: PIVA,ENRICO E, DO  Admit date: 08/30/2014 Discharge date: 09/03/2014  Time spent: > 35 minutes  Recommendations for Outpatient Follow-up:  1. Follow up with De. Yates in 2 weeks 2. Follow up with Dr. Rayann Heman in 2 weeks 3. Follow up with Dr. Carie Caddy in 2 weeks 4. Due to renal failure, his ACEI is held on discharge, consider restarting low dose if blood pressure allows and repeat BMP in 1 week for renal function 5. His Coreg dose was changed as below 6. His diuretics were changed from daily to PRN. Monitor weights.  7. Lovenox s.q. For prophylaxis per ortho  Discharge Diagnoses:  Active Problems:   Mitral regurgitation   Smoking   Hypertension   CAD (coronary artery disease)   Ankle fracture   Ankle fracture, bimalleolar, closed   AKI (acute kidney injury)   Chronic systolic CHF (congestive heart failure)   Other specified hypotension  Discharge Condition: stable  Diet recommendation: heart healthy  Filed Weights   08/30/14 1927  Weight: 73.936 kg (163 lb)   History of present illness:  74 year old male who  has a past medical history of Cardiomyopathy; ICD (implantable cardiac defibrillator) battery depletion; Ejection fraction; Mitral regurgitation; Pulmonary hypertension; COPD (chronic obstructive pulmonary disease); Smoking; Lung nodule; Hypertension; Dyslipidemia; BPH (benign prostatic hyperplasia); Gout; Alcohol abuse; Migraines; LBBB (left bundle branch block); CAD (coronary artery disease); and Syncope. Represents to the hospital with complaint of fall with right ankle pain. Patient says that he was standing in the kitchen when suddenly his legs were shaking and gave out from underneath him and he fell on the ground. He denies loss of consciousness. Patient says that he has been falling a lot due to dizziness but denies syncope. Patient took 2 tramadol at home but continued to  have severe pain. He denies hitting his head on the floor. Denies chest or shortness of breath no nausea vomiting or diarrhea. In the ED xray of ankle shows fractures of distal fibula and lateral tibial metaphysis. Ortho was called at [facility], who recommended to transfer the patient to [facility] Course:  Ankle fracture from fall - orthopedic surgery was consulted, patient underwent open reduction and internal fixation right ankle, lateral malleolus, ORIF syndesmosis on 7/28 by Dr. Lorin Mercy. He recovered well postoperatively and will discharge to SNF. Follow up with Dr. Lorin Mercy in 2 weeks. 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 Repeat 2d Echo this hospitalization with EF 65-70% with grade 1 diastolic dysfunction. Due to his soft blood pressures his Coreg dose was reduced to 6.25, his Lisinopril and Lasix held. On discharge, per cardiology recommendations, will place patient on Lasix 20 mg daily as needed. Will hold his Lisinopril for now, this could potentially be restarted at a lower dose if his BP remains stable and his renal function continues to recover.  Implantable cardioverter defibrillator - interrogated per cardiology  Hypertension  Coronary artery disease - stable, no chest pain, on lipitor and aspirin  Acute kidney injury - pre-renal/dehydration suspected, improved with IVF and discontinuation of Lasix and Lisinopril Migraines - on fioricet 50-325-40 mg COPD with ongoing tobacco abuse - active smoker, 1/2-3/4 ppd   Procedures:  2D echo Study Conclusions - Left ventricle: The cavity size was normal. Wall thickness wasnormal. Systolic function was vigorous. The estimated ejectionfraction was in the range of 65%  to 70%. Doppler parameters areconsistent with abnormal left ventricular relaxation (grade 1diastolic dysfunction). - Ventricular septum: Septal motion showed  abnormal function anddyssynergy. These changes are consistent with intraventricularconduction delay. - Right ventricle: Pacer wire or catheter noted in right ventricle. - Right atrium: Pacer wire or catheter noted in right atrium.    open reduction and internal fixation right ankle, lateral malleolus, ORIF syndesmosis on 7/28 by Dr. Lorin Mercy  Consultations:  Orthopedic surgery   Cardiology   Discharge Exam: Filed Vitals:   09/02/14 0556 09/02/14 1300 09/02/14 2019 09/03/14 0457  BP: 99/61 97/57 103/66 102/60  Pulse: 86 91 84 83  Temp: 99.6 F (37.6 C) 98 F (36.7 C) 98.4 F (36.9 C) 98.6 F (37 C)  TempSrc: Oral  Oral Oral  Resp: '15 16 17 16  '$ Height:      Weight:      SpO2: 95% 96% 98%    General: NAD Cardiovascular: RRR Respiratory: CTA biL  Discharge Instructions     Medication List    STOP taking these medications        lisinopril 40 MG tablet  Commonly known as:  PRINIVIL,ZESTRIL      TAKE these medications        aspirin 325 MG tablet  Take 325 mg by mouth daily.     atorvastatin 80 MG tablet  Commonly known as:  LIPITOR  Take 80 mg by mouth daily.     butalbital-acetaminophen-caffeine 50-325-40-30 MG per capsule  Commonly known as:  FIORICET WITH CODEINE  Take 1 capsule by mouth every 4 (four) hours as needed for headache or migraine.     carbamide peroxide 6.5 % otic solution  Commonly known as:  DEBROX  Place 5 drops into both ears daily as needed (for ear vwax removal).     carvedilol 6.25 MG tablet  Commonly known as:  COREG  Take 1 tablet (6.25 mg total) by mouth 2 (two) times daily with a meal.     divalproex 500 MG 24 hr tablet  Commonly known as:  DEPAKOTE ER  Take 1,500 mg by mouth at bedtime.     enoxaparin 30 MG/0.3ML injection  Commonly known as:  LOVENOX  Inject 0.3 mLs (30 mg total) into the skin daily. For 30 days     finasteride 5 MG tablet  Commonly known as:  PROSCAR  Take 5 mg by mouth daily.     Fish Oil 1000 MG  Caps  Take 1 capsule by mouth daily.     folic acid 1 MG tablet  Commonly known as:  FOLVITE  Take 1 mg by mouth daily.     furosemide 40 MG tablet  Commonly known as:  LASIX  Take 0.5 tablets (20 mg total) by mouth daily as needed for fluid or edema (weight gain > 3lbs).     gabapentin 300 MG capsule  Commonly known as:  NEURONTIN  Take 300-600 mg by mouth at bedtime.     hydroxypropyl methylcellulose / hypromellose 2.5 % ophthalmic solution  Commonly known as:  ISOPTO TEARS / GONIOVISC  Place 1 drop into both eyes 4 (four) times daily as needed for dry eyes.     loratadine 10 MG tablet  Commonly known as:  CLARITIN  Take 10 mg by mouth daily.     mirtazapine 30 MG tablet  Commonly known as:  REMERON  Take 30 mg by mouth at bedtime.     multivitamin tablet  Take 1 tablet by mouth daily.  nitroGLYCERIN 0.4 MG SL tablet  Commonly known as:  NITROSTAT  Place 0.4 mg under the tongue every 5 (five) minutes as needed.     omeprazole 20 MG capsule  Commonly known as:  PRILOSEC  Take 20 mg by mouth daily.     oxyCODONE 5 MG immediate release tablet  Commonly known as:  Oxy IR/ROXICODONE  Take 1-2 tablets (5-10 mg total) by mouth every 3 (three) hours as needed for severe pain.     potassium chloride SA 20 MEQ tablet  Commonly known as:  K-DUR,KLOR-CON  Take 1 tablet (20 mEq total) by mouth daily as needed (In the days when you take Lasix).     tiotropium 18 MCG inhalation capsule  Commonly known as:  SPIRIVA  Place 18 mcg into inhaler and inhale every morning.     traMADol 50 MG tablet  Commonly known as:  ULTRAM  Take 1-2 tablets (50-100 mg total) by mouth every 8 (eight) hours as needed for moderate pain.     Vitamin D3 5000 UNITS Tabs  Take 5,000 Units by mouth daily.           Follow-up Information    Follow up with Marybelle Killings, MD. Schedule an appointment as soon as possible for a visit in 2 weeks.   Specialty:  Orthopedic Surgery   Contact  information:   [place] [address] [phone]       Follow up with PIVA,ENRICO E, DO. Schedule an appointment as soon as possible for a visit in 2 weeks.   Specialty:  Family Medicine   Contact information:   [place] [id number] [phone]       Follow up with Thompson Grayer, MD. Schedule an appointment as soon as possible for a visit in 2 weeks.   Specialty:  Cardiology   Contact information:   [place] [address] [phone]       The results of significant diagnostics from this hospitalization (including imaging, microbiology, ancillary and laboratory) are listed below for reference.    Significant Diagnostic Studies: Dg Ankle 2 Views Right  2014/09/29   CLINICAL DATA:  Right ankle fracture.  EXAM: DG C-ARM 61-120 MIN; RIGHT ANKLE - 2 VIEW  COMPARISON:  Radiographs dated 08/30/2014  FINDINGS: C-arm images demonstrate that the patient has undergone open reduction and internal fixation of the distal fibula fracture. Disruption of the distal tibiofibular syndesmosis has been corrected with a screw across the tibia and fibula. Ankle mortise has been restored.  IMPRESSION: Open reduction and internal fixation of the fibula fracture.   Electronically Signed   By: Lorriane Shire M.D.   On: 09-29-2014 19:27   Dg Ankle Complete Right  08/30/2014   CLINICAL DATA:  Pain following fall  EXAM: RIGHT ANKLE - COMPLETE 3+ VIEW  COMPARISON:  None.  FINDINGS: Frontal, oblique, and lateral views were obtained. There is an obliquely oriented fracture of the distal fibular diaphysis with mild overlapping of fracture fragments. There is a fracture arising from the lateral aspect of the distal fibular metaphysis with this fragment in the distal tibiofibular syndesmosis. There is gross ankle mortise disruption.  There is a spur off the inferior calcaneus. There is also a spur arising from the dorsal proximal navicular.   IMPRESSION: Fractures of the distal fibula and lateral tibial metaphysis with gross ankle mortise disruption.   Electronically Signed   By: Lowella Grip III M.D.  On: 08/30/2014 19:46   Dg Chest Port 1 View  08/30/2014   CLINICAL DATA:  Weakness and frequent falls  EXAM: PORTABLE CHEST - 1 VIEW  COMPARISON:  11/18/2006  FINDINGS: A single [facility] portable view of the chest demonstrates no focal airspace consolidation or alveolar edema. The lungs are grossly clear. There is no large effusion or pneumothorax. Cardiac and mediastinal contours appear unremarkable.  IMPRESSION: No active disease.   Electronically Signed   By: Andreas Newport M.D.   On: 08/30/2014 21:31   Dg C-arm 1-60 Min  08/31/2014   CLINICAL DATA:  Right ankle fracture.  EXAM: DG C-ARM 61-120 MIN; RIGHT ANKLE - 2 VIEW  COMPARISON:  Radiographs dated 08/30/2014  FINDINGS: C-arm images demonstrate that the patient has undergone open reduction and internal fixation of the distal fibula fracture. Disruption of the distal tibiofibular syndesmosis has been corrected with a screw across the tibia and fibula. Ankle mortise has been restored.  IMPRESSION: Open reduction and internal fixation of the fibula fracture.   Electronically Signed   By: Lorriane Shire M.D.   On: 08/31/2014 19:27   Labs: Basic Metabolic Panel:  Recent Labs Lab 08/30/14 2043 08/31/14 0433 09/01/14 0420 09/02/14 0407 09/03/14 0555  NA 136 137 139 137 138  K 5.0 3.8 4.5 4.0 4.3  CL 102 107 109 108 106  CO2 21* '23 24 23 23  '$ GLUCOSE 103* 108* 97 137* 107*  BUN 50* 41* '15 15 18  '$ CREATININE 3.29* 2.30* 1.36* 1.32* 1.29*  CALCIUM 9.0 8.4* 8.9 8.6* 8.6*   Liver Function Tests:  Recent Labs Lab 08/31/14 0433  AST 14*  ALT 9*  ALKPHOS 31*  BILITOT 0.5  PROT 6.3*  ALBUMIN 3.3*   CBC:  Recent Labs Lab 08/30/14 2043 08/31/14 0433 09/01/14 0420 09/03/14 0555  WBC 11.2* 7.6 7.8 7.5  NEUTROABS 8.2*  --   --   --   HGB 12.1* 10.2* 11.3* 9.2*  HCT 34.8*  29.3* 32.6* 26.9*  MCV 88.5 88.3 88.6 89.1  PLT 228 182 184 178   Cardiac Enzymes:  Recent Labs Lab 08/30/14 2043  TROPONINI <0.03   BNP: BNP (last 3 results)  Recent Labs  08/30/14 2043  BNP 19.0     Signed:  GHERGHE, COSTIN  [facility] 09/03/2014, 9:30 AM

## 2014-09-03 NOTE — Progress Notes (Signed)
Echo looks good, heart function has actually improved. No new recs today, please see yesterdays note regarding meds at discharge. Will sign off inpatient care.   Zandra Abts MD

## 2014-09-04 DIAGNOSIS — J449 Chronic obstructive pulmonary disease, unspecified: Secondary | ICD-10-CM | POA: Diagnosis not present

## 2014-09-04 DIAGNOSIS — S82844E Nondisplaced bimalleolar fracture of right lower leg, subsequent encounter for open fracture type I or II with routine healing: Secondary | ICD-10-CM | POA: Diagnosis not present

## 2014-09-04 DIAGNOSIS — I340 Nonrheumatic mitral (valve) insufficiency: Secondary | ICD-10-CM | POA: Diagnosis not present

## 2014-09-04 DIAGNOSIS — I10 Essential (primary) hypertension: Secondary | ICD-10-CM | POA: Diagnosis not present

## 2014-09-08 ENCOUNTER — Ambulatory Visit (INDEPENDENT_AMBULATORY_CARE_PROVIDER_SITE_OTHER): Payer: Medicare Other | Admitting: Internal Medicine

## 2014-09-08 ENCOUNTER — Encounter: Payer: Self-pay | Admitting: Internal Medicine

## 2014-09-08 VITALS — BP 110/78 | HR 88 | Ht 67.0 in | Wt 170.0 lb

## 2014-09-08 DIAGNOSIS — I5022 Chronic systolic (congestive) heart failure: Secondary | ICD-10-CM

## 2014-09-08 DIAGNOSIS — I2510 Atherosclerotic heart disease of native coronary artery without angina pectoris: Secondary | ICD-10-CM

## 2014-09-08 DIAGNOSIS — I429 Cardiomyopathy, unspecified: Secondary | ICD-10-CM | POA: Diagnosis not present

## 2014-09-08 LAB — CUP PACEART INCLINIC DEVICE CHECK
Brady Statistic AS VP Percent: 99.91 %
Brady Statistic AS VS Percent: 0.03 %
Brady Statistic RA Percent Paced: 0.06 %
Brady Statistic RV Percent Paced: 99.96 %
HIGH POWER IMPEDANCE MEASURED VALUE: 190 Ohm
HIGH POWER IMPEDANCE MEASURED VALUE: 67 Ohm
HighPow Impedance: 48 Ohm
HighPow Impedance: 494 Ohm
Lead Channel Impedance Value: 437 Ohm
Lead Channel Impedance Value: 570 Ohm
Lead Channel Pacing Threshold Amplitude: 0.75 V
Lead Channel Pacing Threshold Amplitude: 1 V
Lead Channel Pacing Threshold Pulse Width: 0.4 ms
Lead Channel Sensing Intrinsic Amplitude: 1.5 mV
Lead Channel Sensing Intrinsic Amplitude: 17.375 mV
Lead Channel Setting Pacing Amplitude: 2.5 V
Lead Channel Setting Pacing Pulse Width: 0.4 ms
Lead Channel Setting Pacing Pulse Width: 0.4 ms
Lead Channel Setting Sensing Sensitivity: 0.3 mV
MDC IDC MSMT BATTERY VOLTAGE: 3.07 V
MDC IDC MSMT LEADCHNL LV IMPEDANCE VALUE: 513 Ohm
MDC IDC MSMT LEADCHNL LV IMPEDANCE VALUE: 589 Ohm
MDC IDC MSMT LEADCHNL LV IMPEDANCE VALUE: 950 Ohm
MDC IDC MSMT LEADCHNL LV PACING THRESHOLD AMPLITUDE: 0.75 V
MDC IDC MSMT LEADCHNL RA PACING THRESHOLD PULSEWIDTH: 0.4 ms
MDC IDC MSMT LEADCHNL RV PACING THRESHOLD PULSEWIDTH: 0.4 ms
MDC IDC SESS DTM: 20160805141328
MDC IDC SET LEADCHNL LV PACING AMPLITUDE: 2 V
MDC IDC SET LEADCHNL RA PACING AMPLITUDE: 2 V
MDC IDC SET ZONE DETECTION INTERVAL: 240 ms
MDC IDC SET ZONE DETECTION INTERVAL: 270 ms
MDC IDC SET ZONE DETECTION INTERVAL: 350 ms
MDC IDC STAT BRADY AP VP PERCENT: 0.05 %
MDC IDC STAT BRADY AP VS PERCENT: 0.01 %
Zone Setting Detection Interval: 320 ms
Zone Setting Detection Interval: 400 ms

## 2014-09-08 NOTE — Progress Notes (Signed)
PCP: Caralyn Guile, DO Primary Cardiologist:  Dr Brent Bulla. is a 74 y.o. male who presents today for routine electrophysiology followup.  He got weak and collapsed 2 weeks ago and fractured his R ankle.  He is in SNF recovering.  He was evaluated by Cardiology and was felt to have likely had prerenal azotemia/ dehydration as the cause.  No arrhythmia was found on device interrogation.  He is planning to go to a SNF through the [facility]. Today, he denies symptoms of palpitations, chest pain, shortness of breath,  lower extremity edema, syncope, or ICD shocks.  The patient is otherwise without complaint today.   Past Medical History  Diagnosis Date  . Cardiomyopathy   . ICD (implantable cardiac defibrillator) battery depletion     CRT-D device  . Ejection fraction     Ejection fraction 25%  /  repeat ejection fraction 50%, echo, July, 2009  . Mitral regurgitation     Severe before CRT with annular dilatation and poor coaptation of the mitral leaflets  /  mild to moderate mitral regurgitation, July, 2009, after CRT therapy  . Pulmonary hypertension     44 mmHg, echo, July, 2009  . COPD (chronic obstructive pulmonary disease)   . Smoking     Chantix cause headaches  . Lung nodule     [facility]  . Hypertension   . Dyslipidemia   . BPH (benign prostatic hyperplasia)   . Gout   . Alcohol abuse     In the past.  Stable over many years  . Migraines   . LBBB (left bundle branch block)   . CAD (coronary artery disease)     Catheterization April, 2008, single vessel disease with collateralized branch of the left circumflex  . Syncope     Probably orthostasis   Past Surgical History  Procedure Laterality Date  . Cardiac defibrillator placement  12/29/12    BiV ICD implanted, generator change 12/30/11 by Dr Rayann Heman MDT Protecta XT CRT-D  . Bi-ventricular implantable cardioverter defibrillator N/A 12/30/2011    Procedure: BI-VENTRICULAR IMPLANTABLE CARDIOVERTER  DEFIBRILLATOR  (CRT-D);  Surgeon: Thompson Grayer, MD;  Location: [facility] CATH LAB;  Service: Cardiovascular;  Laterality: N/A;  . Orif ankle fracture Right 08/31/2014    Procedure: OPEN REDUCTION INTERNAL FIXATION (ORIF) ANKLE FRACTURE;  Surgeon: Marybelle Killings, MD;  Location: [place];  Service: Orthopedics;  Laterality: Right;    Current Outpatient Prescriptions  Medication Sig Dispense Refill  . aspirin 325 MG tablet Take 325 mg by mouth daily.      Marland Kitchen atorvastatin (LIPITOR) 80 MG tablet Take 80 mg by mouth daily.    . butalbital-acetaminophen-caffeine (FIORICET WITH CODEINE) 50-325-40-30 MG per capsule Take 1 capsule by mouth every 4 (four) hours as needed for headache or migraine.     . carbamide peroxide (DEBROX) 6.5 % otic solution Place 5 drops into both ears daily as needed (for ear vwax removal).    . carvedilol (COREG) 6.25 MG tablet Take 1 tablet (6.25 mg total) by mouth 2 (two) times daily with a meal.    . Cholecalciferol (VITAMIN D3) 5000 UNITS TABS Take 5,000 Units by mouth daily.    . divalproex (DEPAKOTE) 500 MG 24 hr tablet Take 1,500 mg by mouth at bedtime.      . enoxaparin (LOVENOX) 30 MG/0.3ML injection Inject 0.3 mLs (30 mg total) into the skin daily. For 30 days 0 Syringe   . finasteride (PROSCAR) 5 MG  tablet Take 5 mg by mouth daily.      . folic acid (FOLVITE) 1 MG tablet Take 1 mg by mouth daily.      . furosemide (LASIX) 40 MG tablet Take 0.5 tablets (20 mg total) by mouth daily as needed for fluid or edema (weight gain > 3lbs). 30 tablet   . gabapentin (NEURONTIN) 300 MG capsule Take 300-600 mg by mouth at bedtime.     . hydroxypropyl methylcellulose (ISOPTO TEARS) 2.5 % ophthalmic solution Place 1 drop into both eyes 4 (four) times daily as needed for dry eyes.     Marland Kitchen loratadine (CLARITIN) 10 MG tablet Take 10 mg by mouth daily.    . mirtazapine (REMERON) 30 MG tablet Take 30 mg by mouth at bedtime.    . Multiple Vitamin (MULTIVITAMIN) tablet Take 1 tablet by mouth daily.       . nitroGLYCERIN (NITROSTAT) 0.4 MG SL tablet Place 0.4 mg under the tongue every 5 (five) minutes as needed.      . Omega-3 Fatty Acids (FISH OIL) 1000 MG CAPS Take 1 capsule by mouth daily.    Marland Kitchen omeprazole (PRILOSEC) 20 MG capsule Take 20 mg by mouth daily.      Marland Kitchen oxyCODONE (OXY IR/ROXICODONE) 5 MG immediate release tablet Take 1-2 tablets (5-10 mg total) by mouth every 3 (three) hours as needed for severe pain. 30 tablet 0  . potassium chloride SA (K-DUR,KLOR-CON) 20 MEQ tablet Take 1 tablet (20 mEq total) by mouth daily as needed (In the days when you take Lasix).    Marland Kitchen tiotropium (SPIRIVA) 18 MCG inhalation capsule Place 18 mcg into inhaler and inhale every morning.      . traMADol (ULTRAM) 50 MG tablet Take 1-2 tablets (50-100 mg total) by mouth every 8 (eight) hours as needed for moderate pain. 30 tablet 0   No current facility-administered medications for this visit.    Physical Exam: Filed Vitals:   09/08/14 1005  BP: 110/78  Pulse: 88  Height: '5\' 7"'$  (1.702 m)  Weight: 77.111 kg (170 lb)  SpO2: 98%    GEN- The patient is well appearing, alert and oriented x 3 today.   Head- normocephalic, atraumatic Eyes-  Sclera clear, conjunctiva pink Ears- hearing intact Oropharynx- clear Lungs- Clear to ausculation bilaterally, normal work of breathing Chest- ICD pocket is well healed Heart- Regular rate and rhythm, no murmurs, rubs or gallops, PMI not laterally displaced GI- soft, NT, ND, + BS Extremities- no clubbing, cyanosis, or edema, R ankle in a cast  ICD interrogation- reviewed in detail today,  See PACEART report  Assessment and Plan:  1.  Chronic systolic dysfunction euvolemic today Stable on an appropriate medical regimen Normal ICD function See Pace Art report No changes today  2. Tobacco Smoking cessation encouraged  carelink every 3 months Return in 1 year Followed by Alvis Lemmings in the [facility]  Return to see Dr Ron Parker as scheduled

## 2014-09-08 NOTE — Patient Instructions (Signed)
Your physician recommends that you continue on your current medications as directed. Please refer to the Current Medication list given to you today. Carelink device check on 12/11/14. Your physician recommends that you schedule a follow-up appointment in: 1 year with Dr. Rayann Heman. You will receive a reminder letter in the mail in about 10 months reminding you to call and schedule your appointment. If you don't receive this letter, please contact our office.

## 2014-09-14 DIAGNOSIS — S9304XA Dislocation of right ankle joint, initial encounter: Secondary | ICD-10-CM | POA: Diagnosis not present

## 2014-09-25 ENCOUNTER — Telehealth: Payer: Self-pay | Admitting: Cardiology

## 2014-09-25 ENCOUNTER — Ambulatory Visit (INDEPENDENT_AMBULATORY_CARE_PROVIDER_SITE_OTHER): Payer: Medicare Other | Admitting: *Deleted

## 2014-09-25 DIAGNOSIS — I5022 Chronic systolic (congestive) heart failure: Secondary | ICD-10-CM

## 2014-09-25 DIAGNOSIS — Z95810 Presence of automatic (implantable) cardiac defibrillator: Secondary | ICD-10-CM

## 2014-09-25 NOTE — Telephone Encounter (Signed)
Spoke with pt and reminded pt of remote transmission that is due today. Pt verbalized understanding.   

## 2014-09-27 ENCOUNTER — Telehealth: Payer: Self-pay | Admitting: Internal Medicine

## 2014-09-27 ENCOUNTER — Telehealth: Payer: Self-pay

## 2014-09-27 NOTE — Telephone Encounter (Signed)
Patient wanted to know if he goes to the hospital for lab work or to the [facility]. Patient advised that the lab work is to be done at the hospital.

## 2014-09-27 NOTE — Progress Notes (Signed)
EPIC Encounter for ICM Monitoring  Patient Name: Paul [name]. is a 74 y.o. male Date: 09/27/2014 Primary Care Physican: Caralyn Guile, DO Primary Cardiologist: Ron Parker Electrophysiologist: Allred Dry Weight: 172 lbs with leg cast       In the past month, have you:  1. Gained more than 2 pounds in a day or more than 5 pounds in a week? Gained weight since 08/25/2014 (was 165lbs) due to being in rehab for the last month.  He is weighing with leg cast at this time.    2. Had changes in your medications (with verification of current medications)? Patient discharged on 09/03/2014 and instructed to take Lasix 20 mg prn for > 3 lb wt gain.  During rehab unsure if diuretic given. He was discharged from rehab on 09/22/2014 and has been taking Lasix '40mg'$  1 tablet daily.   Prior to hospitalization he was taking Lasix '40mg'$  daily prior leg fracture and thought he was to resume the same dosage.  3. Had more shortness of breath than is usual for you? no  4. Limited your activity because of shortness of breath? no  5. Not been able to sleep because of shortness of breath? no  6. Had increased swelling in your feet or ankles? no  7. Had symptoms of dehydration (dizziness, dry mouth, increased thirst, decreased urine output) no  8. Had changes in sodium restriction? no   9. Been compliant with medication? Yes   ICM trend:   Follow-up plan: ICM clinic phone appointment on 10/11/2014, repeat transmission.  Optivol fluid index significantly higher than baseline and impedance is significantly below baseline trending toward fluid retention.  Patient reported weight gain due to type of food given in rehab and he does not think he was receiving the same type of diuretic.  He stated he occasionally (once a month) eating foods high in sodium since being home such as TV dinners/country ham and verbalized he knows they are high in sodium.  Education given sodium can increase fluid retention.  He continues to have  leg cast due to leg fracture repair 09/02/2014.     Reviewed labs and Optivol transmission with Dr Rayann Heman and he recommended patient take Lasix 40 mg 1 tablet bid x 3 days and then resume Lasix 40 mg 1 tablet daily.  Also order for BMET lab. Repeat Optivol transmission 10/11/2014.    Patient notified and will have labs drawn on Friday, 09/29/2014 at [facility] facility.    Copy of note sent to patient's primary care physician, primary cardiologist, and device following physician.  [name], RN, CCM 09/27/2014 11:57 AM

## 2014-09-27 NOTE — Telephone Encounter (Signed)
Spoke with patient.

## 2014-09-27 NOTE — Telephone Encounter (Signed)
Patient has questions regarding his lab work orders / tg

## 2014-09-28 DIAGNOSIS — Z9181 History of falling: Secondary | ICD-10-CM | POA: Diagnosis not present

## 2014-09-28 DIAGNOSIS — N189 Chronic kidney disease, unspecified: Secondary | ICD-10-CM | POA: Diagnosis not present

## 2014-09-28 DIAGNOSIS — Z95810 Presence of automatic (implantable) cardiac defibrillator: Secondary | ICD-10-CM | POA: Diagnosis not present

## 2014-09-28 DIAGNOSIS — J449 Chronic obstructive pulmonary disease, unspecified: Secondary | ICD-10-CM | POA: Diagnosis not present

## 2014-09-28 DIAGNOSIS — Z7982 Long term (current) use of aspirin: Secondary | ICD-10-CM | POA: Diagnosis not present

## 2014-09-28 DIAGNOSIS — I129 Hypertensive chronic kidney disease with stage 1 through stage 4 chronic kidney disease, or unspecified chronic kidney disease: Secondary | ICD-10-CM | POA: Diagnosis not present

## 2014-09-28 DIAGNOSIS — F17210 Nicotine dependence, cigarettes, uncomplicated: Secondary | ICD-10-CM | POA: Diagnosis not present

## 2014-09-28 DIAGNOSIS — I5022 Chronic systolic (congestive) heart failure: Secondary | ICD-10-CM | POA: Diagnosis not present

## 2014-09-28 DIAGNOSIS — S82844D Nondisplaced bimalleolar fracture of right lower leg, subsequent encounter for closed fracture with routine healing: Secondary | ICD-10-CM | POA: Diagnosis not present

## 2014-09-28 DIAGNOSIS — I2510 Atherosclerotic heart disease of native coronary artery without angina pectoris: Secondary | ICD-10-CM | POA: Diagnosis not present

## 2014-09-29 DIAGNOSIS — I5022 Chronic systolic (congestive) heart failure: Secondary | ICD-10-CM | POA: Diagnosis not present

## 2014-10-03 DIAGNOSIS — I129 Hypertensive chronic kidney disease with stage 1 through stage 4 chronic kidney disease, or unspecified chronic kidney disease: Secondary | ICD-10-CM | POA: Diagnosis not present

## 2014-10-03 DIAGNOSIS — I5022 Chronic systolic (congestive) heart failure: Secondary | ICD-10-CM | POA: Diagnosis not present

## 2014-10-03 DIAGNOSIS — N189 Chronic kidney disease, unspecified: Secondary | ICD-10-CM | POA: Diagnosis not present

## 2014-10-03 DIAGNOSIS — I2510 Atherosclerotic heart disease of native coronary artery without angina pectoris: Secondary | ICD-10-CM | POA: Diagnosis not present

## 2014-10-03 DIAGNOSIS — S82844D Nondisplaced bimalleolar fracture of right lower leg, subsequent encounter for closed fracture with routine healing: Secondary | ICD-10-CM | POA: Diagnosis not present

## 2014-10-03 DIAGNOSIS — J449 Chronic obstructive pulmonary disease, unspecified: Secondary | ICD-10-CM | POA: Diagnosis not present

## 2014-10-04 DIAGNOSIS — N189 Chronic kidney disease, unspecified: Secondary | ICD-10-CM | POA: Diagnosis not present

## 2014-10-04 DIAGNOSIS — I2510 Atherosclerotic heart disease of native coronary artery without angina pectoris: Secondary | ICD-10-CM | POA: Diagnosis not present

## 2014-10-04 DIAGNOSIS — I129 Hypertensive chronic kidney disease with stage 1 through stage 4 chronic kidney disease, or unspecified chronic kidney disease: Secondary | ICD-10-CM | POA: Diagnosis not present

## 2014-10-04 DIAGNOSIS — S82844D Nondisplaced bimalleolar fracture of right lower leg, subsequent encounter for closed fracture with routine healing: Secondary | ICD-10-CM | POA: Diagnosis not present

## 2014-10-04 DIAGNOSIS — J449 Chronic obstructive pulmonary disease, unspecified: Secondary | ICD-10-CM | POA: Diagnosis not present

## 2014-10-04 DIAGNOSIS — I5022 Chronic systolic (congestive) heart failure: Secondary | ICD-10-CM | POA: Diagnosis not present

## 2014-10-06 DIAGNOSIS — I2510 Atherosclerotic heart disease of native coronary artery without angina pectoris: Secondary | ICD-10-CM | POA: Diagnosis not present

## 2014-10-06 DIAGNOSIS — I129 Hypertensive chronic kidney disease with stage 1 through stage 4 chronic kidney disease, or unspecified chronic kidney disease: Secondary | ICD-10-CM | POA: Diagnosis not present

## 2014-10-06 DIAGNOSIS — N189 Chronic kidney disease, unspecified: Secondary | ICD-10-CM | POA: Diagnosis not present

## 2014-10-06 DIAGNOSIS — I5022 Chronic systolic (congestive) heart failure: Secondary | ICD-10-CM | POA: Diagnosis not present

## 2014-10-06 DIAGNOSIS — S82844D Nondisplaced bimalleolar fracture of right lower leg, subsequent encounter for closed fracture with routine healing: Secondary | ICD-10-CM | POA: Diagnosis not present

## 2014-10-06 DIAGNOSIS — J449 Chronic obstructive pulmonary disease, unspecified: Secondary | ICD-10-CM | POA: Diagnosis not present

## 2014-10-10 DIAGNOSIS — I129 Hypertensive chronic kidney disease with stage 1 through stage 4 chronic kidney disease, or unspecified chronic kidney disease: Secondary | ICD-10-CM | POA: Diagnosis not present

## 2014-10-10 DIAGNOSIS — J449 Chronic obstructive pulmonary disease, unspecified: Secondary | ICD-10-CM | POA: Diagnosis not present

## 2014-10-10 DIAGNOSIS — S82844D Nondisplaced bimalleolar fracture of right lower leg, subsequent encounter for closed fracture with routine healing: Secondary | ICD-10-CM | POA: Diagnosis not present

## 2014-10-10 DIAGNOSIS — N189 Chronic kidney disease, unspecified: Secondary | ICD-10-CM | POA: Diagnosis not present

## 2014-10-10 DIAGNOSIS — I2510 Atherosclerotic heart disease of native coronary artery without angina pectoris: Secondary | ICD-10-CM | POA: Diagnosis not present

## 2014-10-10 DIAGNOSIS — I5022 Chronic systolic (congestive) heart failure: Secondary | ICD-10-CM | POA: Diagnosis not present

## 2014-10-11 ENCOUNTER — Ambulatory Visit (INDEPENDENT_AMBULATORY_CARE_PROVIDER_SITE_OTHER): Payer: Medicare Other

## 2014-10-11 DIAGNOSIS — I5022 Chronic systolic (congestive) heart failure: Secondary | ICD-10-CM

## 2014-10-11 DIAGNOSIS — Z95810 Presence of automatic (implantable) cardiac defibrillator: Secondary | ICD-10-CM

## 2014-10-11 NOTE — Progress Notes (Signed)
EPIC Encounter for ICM Monitoring  Patient Name: Paul [name]. is a 74 y.o. male Date: 10/11/2014 Primary Care Physican: Caralyn Guile, DO Primary Cardiologist: Ron Parker Electrophysiologist: Allred  Dry Weight: 160 lbs with leg cast       In the past month, have you:  1. Gained more than 2 pounds in a day or more than 5 pounds in a week? No, was 172 lbs 2 weeks ago and now 160 lbs.  2. Had changes in your medications (with verification of current medications)? no  3. Had more shortness of breath than is usual for you? no  4. Limited your activity because of shortness of breath? no  5. Not been able to sleep because of shortness of breath? no  6. Had increased swelling in your feet or ankles? no  7. Had symptoms of dehydration (dizziness, dry mouth, increased thirst, decreased urine output) no  8. Had changes in sodium restriction? no  9. Been compliant with medication? Yes   ICM trend:   Follow-up plan: ICM clinic phone appointment 10/23/2014.  Patient stated he was feeling much better, leg swelling has resolved and breathing has improved.  He stated he did not get results of his lab work from [facility] and requested I call and get the results.  He has appointment with orthopedic surgeon tomorrow to have leg cast removed.  No changes today.         Call to [facility] and requested BMP results be faxed.  Patient had BMP drawn on 09/29/2014.  Received results and BUN 26, Creatinine 1.62        Discussed [facility] results with Dr Rayann Heman.  Instructed patient to take Lasix 40 mg tablet - 0.5 tablet (20 mg total) by mouth daily as needed for fluid, edema (weight gain >3lbs)        Patient notified of lab results, BUN 26 and Creatinine 1.62 and change Lasix dosage to 40 mg tablet - 0.5 tablet (20 mg total) by mouth daily as needed for fluid, edema (weight gain>3lbs).  Patient verbalized        understanding.  Education given to maintain low sodium diet, call  office for HF symptoms.    Copy of note sent to patient's primary care physician, primary cardiologist, and device following physician.  [name], RN, CCM 10/11/2014 11:40 AM

## 2014-10-12 DIAGNOSIS — S9304XD Dislocation of right ankle joint, subsequent encounter: Secondary | ICD-10-CM | POA: Diagnosis not present

## 2014-10-16 DIAGNOSIS — J449 Chronic obstructive pulmonary disease, unspecified: Secondary | ICD-10-CM | POA: Diagnosis not present

## 2014-10-16 DIAGNOSIS — N189 Chronic kidney disease, unspecified: Secondary | ICD-10-CM | POA: Diagnosis not present

## 2014-10-16 DIAGNOSIS — I2510 Atherosclerotic heart disease of native coronary artery without angina pectoris: Secondary | ICD-10-CM | POA: Diagnosis not present

## 2014-10-16 DIAGNOSIS — I5022 Chronic systolic (congestive) heart failure: Secondary | ICD-10-CM | POA: Diagnosis not present

## 2014-10-16 DIAGNOSIS — S82844D Nondisplaced bimalleolar fracture of right lower leg, subsequent encounter for closed fracture with routine healing: Secondary | ICD-10-CM | POA: Diagnosis not present

## 2014-10-16 DIAGNOSIS — I129 Hypertensive chronic kidney disease with stage 1 through stage 4 chronic kidney disease, or unspecified chronic kidney disease: Secondary | ICD-10-CM | POA: Diagnosis not present

## 2014-10-18 DIAGNOSIS — J449 Chronic obstructive pulmonary disease, unspecified: Secondary | ICD-10-CM | POA: Diagnosis not present

## 2014-10-18 DIAGNOSIS — I5022 Chronic systolic (congestive) heart failure: Secondary | ICD-10-CM | POA: Diagnosis not present

## 2014-10-18 DIAGNOSIS — I2510 Atherosclerotic heart disease of native coronary artery without angina pectoris: Secondary | ICD-10-CM | POA: Diagnosis not present

## 2014-10-18 DIAGNOSIS — I129 Hypertensive chronic kidney disease with stage 1 through stage 4 chronic kidney disease, or unspecified chronic kidney disease: Secondary | ICD-10-CM | POA: Diagnosis not present

## 2014-10-18 DIAGNOSIS — S82844D Nondisplaced bimalleolar fracture of right lower leg, subsequent encounter for closed fracture with routine healing: Secondary | ICD-10-CM | POA: Diagnosis not present

## 2014-10-18 DIAGNOSIS — N189 Chronic kidney disease, unspecified: Secondary | ICD-10-CM | POA: Diagnosis not present

## 2014-10-23 ENCOUNTER — Ambulatory Visit (INDEPENDENT_AMBULATORY_CARE_PROVIDER_SITE_OTHER): Payer: Medicare Other

## 2014-10-23 DIAGNOSIS — I5022 Chronic systolic (congestive) heart failure: Secondary | ICD-10-CM | POA: Diagnosis not present

## 2014-10-23 DIAGNOSIS — J449 Chronic obstructive pulmonary disease, unspecified: Secondary | ICD-10-CM | POA: Diagnosis not present

## 2014-10-23 DIAGNOSIS — Z95810 Presence of automatic (implantable) cardiac defibrillator: Secondary | ICD-10-CM | POA: Diagnosis not present

## 2014-10-23 DIAGNOSIS — I129 Hypertensive chronic kidney disease with stage 1 through stage 4 chronic kidney disease, or unspecified chronic kidney disease: Secondary | ICD-10-CM | POA: Diagnosis not present

## 2014-10-23 DIAGNOSIS — S82844D Nondisplaced bimalleolar fracture of right lower leg, subsequent encounter for closed fracture with routine healing: Secondary | ICD-10-CM | POA: Diagnosis not present

## 2014-10-23 DIAGNOSIS — N189 Chronic kidney disease, unspecified: Secondary | ICD-10-CM | POA: Diagnosis not present

## 2014-10-23 DIAGNOSIS — I2510 Atherosclerotic heart disease of native coronary artery without angina pectoris: Secondary | ICD-10-CM | POA: Diagnosis not present

## 2014-10-25 NOTE — Progress Notes (Signed)
EPIC Encounter for ICM Monitoring  Patient Name: Paul [name]. is a 74 y.o. male Date: 10/25/2014 Primary Care Physican: Caralyn Guile, DO Primary Cardiologist: Ron Parker Electrophysiologist: Allred Dry Weight: 163 lbs       In the past month, have you:  1. Gained more than 2 pounds in a day or more than 5 pounds in a week? no  2. Had changes in your medications (with verification of current medications)? no  3. Had more shortness of breath than is usual for you? no  4. Limited your activity because of shortness of breath? no  5. Not been able to sleep because of shortness of breath? no  6. Had increased swelling in your feet or ankles? no  7. Had symptoms of dehydration (dizziness, dry mouth, increased thirst, decreased urine output) no  8. Had changes in sodium restriction? no  9. Been compliant with medication? Yes   ICM trend:  Follow-up plan: ICM clinic phone appointment on 12/01/2014.  Impedance slightly above baseline.  He has been taking Lasix '20mg'$  daily and explained at last ICM call that he can take as needed.  He verbalized understanding.  He reported he is feeling good now.  No changes today.    Copy of note sent to patient's primary care physician, primary cardiologist, and device following physician.  [name], RN, CCM 10/25/2014 1:44 PM

## 2014-10-26 DIAGNOSIS — I5022 Chronic systolic (congestive) heart failure: Secondary | ICD-10-CM | POA: Diagnosis not present

## 2014-10-26 DIAGNOSIS — I2510 Atherosclerotic heart disease of native coronary artery without angina pectoris: Secondary | ICD-10-CM | POA: Diagnosis not present

## 2014-10-26 DIAGNOSIS — I129 Hypertensive chronic kidney disease with stage 1 through stage 4 chronic kidney disease, or unspecified chronic kidney disease: Secondary | ICD-10-CM | POA: Diagnosis not present

## 2014-10-26 DIAGNOSIS — S82844D Nondisplaced bimalleolar fracture of right lower leg, subsequent encounter for closed fracture with routine healing: Secondary | ICD-10-CM | POA: Diagnosis not present

## 2014-10-26 DIAGNOSIS — J449 Chronic obstructive pulmonary disease, unspecified: Secondary | ICD-10-CM | POA: Diagnosis not present

## 2014-10-26 DIAGNOSIS — N189 Chronic kidney disease, unspecified: Secondary | ICD-10-CM | POA: Diagnosis not present

## 2014-10-30 ENCOUNTER — Encounter: Payer: Self-pay | Admitting: Internal Medicine

## 2014-12-01 ENCOUNTER — Ambulatory Visit (INDEPENDENT_AMBULATORY_CARE_PROVIDER_SITE_OTHER): Payer: Medicare Other

## 2014-12-01 DIAGNOSIS — Z95810 Presence of automatic (implantable) cardiac defibrillator: Secondary | ICD-10-CM | POA: Diagnosis not present

## 2014-12-01 DIAGNOSIS — I5022 Chronic systolic (congestive) heart failure: Secondary | ICD-10-CM

## 2014-12-01 NOTE — Progress Notes (Signed)
EPIC Encounter for ICM Monitoring  Patient Name: Paul [name]. is a 74 y.o. male Date: 12/01/2014 Primary Care Physican: Caralyn Guile, DO Primary Cardiologist: Ron Parker Electrophysiologist: Allred Dry Weight: 160 lb       In the past month, have you:  1. Gained more than 2 pounds in a day or more than 5 pounds in a week? no  2. Had changes in your medications (with verification of current medications)? no  3. Had more shortness of breath than is usual for you? no  4. Limited your activity because of shortness of breath? no  5. Not been able to sleep because of shortness of breath? no  6. Had increased swelling in your feet or ankles? no  7. Had symptoms of dehydration (dizziness, dry mouth, increased thirst, decreased urine output) no  8. Had changes in sodium restriction? no  9. Been compliant with medication? Yes   ICM trend: 12/01/2014    Follow-up plan: ICM clinic phone appointment 01/03/2015.  Optivol impedance below baseline 11/15/2014 to 11/27/2014 and he denied any symptoms at that time.  Impedance back to baseline in the last few days.  Education given on limiting salt and fluid intake.  He reported he does not eat foods high in sodium. He asked if he should take the Furosemide daily and explained he should take as prescribed, prn for edema or wt gain of 3lbs.  Explained Furosemide can effects the kidneys and that is the reason he should take as prescribed.   No changes today.   Copy of note sent to patient's primary care physician, primary cardiologist, and device following physician.  [name], RN, CCM 12/01/2014 1:07 PM

## 2015-01-03 ENCOUNTER — Ambulatory Visit (INDEPENDENT_AMBULATORY_CARE_PROVIDER_SITE_OTHER): Payer: Medicare Other | Admitting: *Deleted

## 2015-01-03 DIAGNOSIS — I5022 Chronic systolic (congestive) heart failure: Secondary | ICD-10-CM

## 2015-01-03 DIAGNOSIS — I429 Cardiomyopathy, unspecified: Secondary | ICD-10-CM | POA: Diagnosis not present

## 2015-01-03 DIAGNOSIS — Z95810 Presence of automatic (implantable) cardiac defibrillator: Secondary | ICD-10-CM | POA: Diagnosis not present

## 2015-01-04 DIAGNOSIS — S9304XD Dislocation of right ankle joint, subsequent encounter: Secondary | ICD-10-CM | POA: Diagnosis not present

## 2015-01-04 DIAGNOSIS — S8261XD Displaced fracture of lateral malleolus of right fibula, subsequent encounter for closed fracture with routine healing: Secondary | ICD-10-CM | POA: Diagnosis not present

## 2015-01-04 NOTE — Progress Notes (Signed)
Remote ICD transmission.   

## 2015-01-05 NOTE — Progress Notes (Signed)
EPIC Encounter for ICM Monitoring  Patient Name: Paul [name]. is a 74 y.o. male Date: 01/05/2015 Primary Care Physican: Caralyn Guile, DO Primary Cardiologist: Ron Parker Electrophysiologist: Allred Dry Weight: 160 lb       In the past month, have you:  1. Gained more than 2 pounds in a day or more than 5 pounds in a week? no  2. Had changes in your medications (with verification of current medications)? no  3. Had more shortness of breath than is usual for you? no  4. Limited your activity because of shortness of breath? no  5. Not been able to sleep because of shortness of breath? no  6. Had increased swelling in your feet or ankles? no  7. Had symptoms of dehydration (dizziness, dry mouth, increased thirst, decreased urine output) no  8. Had changes in sodium restriction? no  9. Been compliant with medication? Yes   ICM trend: 01/04/2015   Follow-up plan: ICM clinic phone appointment on 02/08/2015.  Optivol thoracic impedance above baseline 12/03/2014 to 12/22/2014 suggesting fluid retention. He reported no HF symptoms and he eats out several times a week and foods tend to be high in sodium.  Impedance above baseline 12/28/2014 to 01/02/2015 and he also said it was due to foods high in sodium over the holiday.  He denied any current symptoms and doing well.  He takes Furosemide prn and has not taken any in the last few weeks.  Encouraged him to take Furosemide if needed.  No changes today.    Copy of note sent to patient's primary care physician, primary cardiologist, and device following physician.  [name], RN, CCM 01/05/2015 1:21 PM

## 2015-01-15 LAB — CUP PACEART REMOTE DEVICE CHECK
Battery Voltage: 3.02 V
Brady Statistic AP VP Percent: 0.14 %
Brady Statistic AP VS Percent: 0.01 %
Brady Statistic AS VP Percent: 99.83 %
Date Time Interrogation Session: 20161130062508
HIGH POWER IMPEDANCE MEASURED VALUE: 494 Ohm
HIGH POWER IMPEDANCE MEASURED VALUE: 66 Ohm
HighPow Impedance: 51 Ohm
Implantable Lead Implant Date: 20081014
Implantable Lead Implant Date: 20081014
Implantable Lead Location: 753858
Implantable Lead Location: 753859
Implantable Lead Model: 4542
Implantable Lead Model: 5076
Implantable Lead Model: 6947
Lead Channel Impedance Value: 1045 Ohm
Lead Channel Impedance Value: 703 Ohm
Lead Channel Pacing Threshold Amplitude: 0.75 V
Lead Channel Pacing Threshold Amplitude: 0.75 V
Lead Channel Pacing Threshold Pulse Width: 0.4 ms
Lead Channel Pacing Threshold Pulse Width: 0.4 ms
Lead Channel Sensing Intrinsic Amplitude: 15.75 mV
Lead Channel Sensing Intrinsic Amplitude: 15.75 mV
Lead Channel Setting Pacing Amplitude: 2 V
Lead Channel Setting Pacing Amplitude: 2 V
Lead Channel Setting Pacing Amplitude: 2.5 V
Lead Channel Setting Pacing Pulse Width: 0.4 ms
Lead Channel Setting Pacing Pulse Width: 0.4 ms
Lead Channel Setting Sensing Sensitivity: 0.3 mV
MDC IDC LEAD IMPLANT DT: 20081014
MDC IDC LEAD LOCATION: 753860
MDC IDC LEAD SERIAL: 128499
MDC IDC MSMT LEADCHNL LV IMPEDANCE VALUE: 627 Ohm
MDC IDC MSMT LEADCHNL RA IMPEDANCE VALUE: 456 Ohm
MDC IDC MSMT LEADCHNL RA SENSING INTR AMPL: 1.75 mV
MDC IDC MSMT LEADCHNL RA SENSING INTR AMPL: 1.75 mV
MDC IDC MSMT LEADCHNL RV IMPEDANCE VALUE: 589 Ohm
MDC IDC MSMT LEADCHNL RV PACING THRESHOLD AMPLITUDE: 1.125 V
MDC IDC MSMT LEADCHNL RV PACING THRESHOLD PULSEWIDTH: 0.4 ms
MDC IDC STAT BRADY AS VS PERCENT: 0.03 %
MDC IDC STAT BRADY RA PERCENT PACED: 0.15 %
MDC IDC STAT BRADY RV PERCENT PACED: 99.97 %

## 2015-01-16 ENCOUNTER — Encounter: Payer: Self-pay | Admitting: Cardiology

## 2015-02-08 ENCOUNTER — Ambulatory Visit (INDEPENDENT_AMBULATORY_CARE_PROVIDER_SITE_OTHER): Payer: Medicare Other

## 2015-02-08 DIAGNOSIS — I5022 Chronic systolic (congestive) heart failure: Secondary | ICD-10-CM | POA: Diagnosis not present

## 2015-02-08 DIAGNOSIS — Z95810 Presence of automatic (implantable) cardiac defibrillator: Secondary | ICD-10-CM | POA: Diagnosis not present

## 2015-02-12 NOTE — Progress Notes (Signed)
EPIC Encounter for ICM Monitoring  Patient Name: Paul [name]. is a 75 y.o. male Date: 02/12/2015 Primary Care Physican: Caralyn Guile, DO Primary Cardiologist: Ron Parker Electrophysiologist: Allred Dry Weight: 160 lb       In the past month, have you:  1. Gained more than 2 pounds in a day or more than 5 pounds in a week? no  2. Had changes in your medications (with verification of current medications)? no  3. Had more shortness of breath than is usual for you? no  4. Limited your activity because of shortness of breath? no  5. Not been able to sleep because of shortness of breath? no  6. Had increased swelling in your feet or ankles? no  7. Had symptoms of dehydration (dizziness, dry mouth, increased thirst, decreased urine output) no  8. Had changes in sodium restriction? no  9. Been compliant with medication? Yes, he reported he has been taking Lasix 40 mg for the last week due to feeling like he had fluid.     ICM trend: 02/12/2015 - 1 year view   ICM Trend:  3 month view - 02/12/2015   Follow-up plan: ICM clinic phone appointment on 02/28/2015.  Optivol thoracic impedance below baseline 12/26/2014 to 01/17/2015 (with a few days at baseline), 01/21/2015 to 01/28/2015, 01/22/2015 to 02/11/2015 suggesting fluid retention.  He denied any symptoms.  Impedance starting to trend below baseline today, date of transmission.  He reported he follows low salt diet but is not checking food labels for sodium amounts.    Reviewed ICM transmission with Dr Rayann Heman in the office.  He ordered for patient to take Lasix 40 mg tablet daily x 3 days and Potassium 20 meq tablet daily x 3 days when he takes Lasix.  After 3 days he should resume Lasix 40 mg tablet - 1/2 tablet ('20mg'$ ) prn for fluid retention and Potassium 20 meq 1 tablet prn on the days Lasix is taken.   Repeat transmission on 02/28/2015. Patient notified of Dr Jackalyn Lombard orders and repeat transmission.  He verbalized understanding.  Encouraged  to call should he experience any symptoms.   Copy of note sent to patient's primary care physician, primary cardiologist, and device following physician.  [name], RN, CCM 02/12/2015 2:40 PM

## 2015-02-28 ENCOUNTER — Ambulatory Visit (INDEPENDENT_AMBULATORY_CARE_PROVIDER_SITE_OTHER): Payer: Medicare Other

## 2015-02-28 DIAGNOSIS — I5022 Chronic systolic (congestive) heart failure: Secondary | ICD-10-CM

## 2015-02-28 DIAGNOSIS — Z95810 Presence of automatic (implantable) cardiac defibrillator: Secondary | ICD-10-CM

## 2015-02-28 NOTE — Progress Notes (Signed)
EPIC Encounter for ICM Monitoring  Patient Name: Paul [name]. is a 75 y.o. male Date: 02/28/2015 Primary Care Physican: Caralyn Guile, DO Primary Cardiologist: Ron Parker  Electrophysiologist: Allred  Dry Weight: 160 lbs       Bi-V Pacing 100%  In the past month, have you:  1. Gained more than 2 pounds in a day or more than 5 pounds in a week? no  2. Had changes in your medications (with verification of current medications)? no  3. Had more shortness of breath than is usual for you? no  4. Limited your activity because of shortness of breath? no  5. Not been able to sleep because of shortness of breath? no  6. Had increased swelling in your feet or ankles? no  7. Had symptoms of dehydration (dizziness, dry mouth, increased thirst, decreased urine output) no  8. Had changes in sodium restriction? no  9. Been compliant with medication? Yes   ICM trend: 3 month view for 02/28/2015   ICM trend: 1 year view for 02/28/2015   Follow-up plan: ICM clinic phone appointment 04/02/2015.  Optivol thoracic impedance trending along baseline since 02/08/2015 and taking additional dosage of Furosemide x 3 days on 02/12/2015.  He stated he is feeling fine.  Denied any HF symptoms.  No changes today. Encouraged to call for any HF symptoms.   Copy of note sent to patient's primary care physician, primary cardiologist, and device following physician.  [name], RN, CCM 02/28/2015 1:07 PM

## 2015-03-01 NOTE — Addendum Note (Signed)
Addended by: [name] on: 03/01/2015 08:42 AM   Modules accepted: Level of Service

## 2015-04-02 ENCOUNTER — Ambulatory Visit (INDEPENDENT_AMBULATORY_CARE_PROVIDER_SITE_OTHER): Payer: Medicare Other

## 2015-04-02 DIAGNOSIS — I5022 Chronic systolic (congestive) heart failure: Secondary | ICD-10-CM | POA: Diagnosis not present

## 2015-04-02 DIAGNOSIS — Z95810 Presence of automatic (implantable) cardiac defibrillator: Secondary | ICD-10-CM

## 2015-04-04 ENCOUNTER — Ambulatory Visit (INDEPENDENT_AMBULATORY_CARE_PROVIDER_SITE_OTHER): Payer: Medicare Other | Admitting: *Deleted

## 2015-04-04 DIAGNOSIS — Z95810 Presence of automatic (implantable) cardiac defibrillator: Secondary | ICD-10-CM | POA: Diagnosis not present

## 2015-04-04 DIAGNOSIS — I429 Cardiomyopathy, unspecified: Secondary | ICD-10-CM | POA: Diagnosis not present

## 2015-04-04 NOTE — Progress Notes (Signed)
Remote ICD transmission.   

## 2015-04-05 ENCOUNTER — Telehealth: Payer: Self-pay

## 2015-04-05 NOTE — Telephone Encounter (Signed)
Remote ICM transmission received.  Attempted patient call and left message for return call.   

## 2015-04-05 NOTE — Telephone Encounter (Signed)
Spoke with patient.

## 2015-04-05 NOTE — Progress Notes (Signed)
EPIC Encounter for ICM Monitoring  Patient Name: Paul [name]. is a 75 y.o. male Date: 04/05/2015 Primary Care Physican: Caralyn Guile, DO Primary Cardiologist: Katz/McDowell Electrophysiologist: Allred Dry Weight: 160 lbs  Bi-V Pacing 100%       In the past month, have you:  1. Gained more than 2 pounds in a day or more than 5 pounds in a week? no  2. Had changes in your medications (with verification of current medications)? no  3. Had more shortness of breath than is usual for you? no  4. Limited your activity because of shortness of breath? no  5. Not been able to sleep because of shortness of breath? no  6. Had increased swelling in your feet or ankles? no  7. Had symptoms of dehydration (dizziness, dry mouth, increased thirst, decreased urine output) no  8. Had changes in sodium restriction? no  9. Been compliant with medication? Yes   ICM trend: 3 month view for 04/04/2015   ICM trend: 1 year view for 04/04/2015   Follow-up plan: ICM clinic phone appointment on 05/07/2015.  Optivol thoracic impedance trending along reference line suggesting stable fluid levels.  He denied any fluid symptoms.  Taking Furosemide prn.  He stated he is feeling fine.   No changes today.    Copy of note sent to patient's primary care physician, primary cardiologist, and device following physician.  [name], RN, CCM 04/05/2015 9:56 AM

## 2015-05-07 ENCOUNTER — Ambulatory Visit (INDEPENDENT_AMBULATORY_CARE_PROVIDER_SITE_OTHER): Payer: Medicare Other

## 2015-05-07 DIAGNOSIS — I5022 Chronic systolic (congestive) heart failure: Secondary | ICD-10-CM

## 2015-05-07 DIAGNOSIS — Z95810 Presence of automatic (implantable) cardiac defibrillator: Secondary | ICD-10-CM | POA: Diagnosis not present

## 2015-05-07 NOTE — Progress Notes (Signed)
EPIC Encounter for ICM Monitoring  Patient Name: Paul [name]. is a 75 y.o. male Date: 05/07/2015 Primary Care Physican: Caralyn Guile, DO Primary Cardiologist: Katz/McDowell Electrophysiologist: Allred Dry Weight: 165 lbs   Bi-V Pacing 100%      In the past month, have you:  1. Gained more than 2 pounds in a day or more than 5 pounds in a week? No, gained 3 lbs over last month  2. Had changes in your medications (with verification of current medications)? no  3. Had more shortness of breath than is usual for you? no  4. Limited your activity because of shortness of breath? no  5. Not been able to sleep because of shortness of breath? no  6. Had increased swelling in your feet or ankles? no  7. Had symptoms of dehydration (dizziness, dry mouth, increased thirst, decreased urine output) no  8. Had changes in sodium restriction? no  9. Been compliant with medication? Yes   ICM trend: 3 month view for 05/07/2015   ICM trend: 1 year view for 05/07/2015   Follow-up plan: ICM clinic phone appointment on 05/29/2015 day before 1st visit with Dr Domenic Polite on 05/30/2015.  Was a patient of Dr Ron Parker.   Thoracic impedance below reference line from 04/02/2015 to 04/23/2015 with 2 days at reference line and 04/30/2015 to 05/07/2015 suggesting fluid accumulation.  Patient reported occasional bloating in the belly.  [facility].  Reminded him to follow low salt diet of '2000mg'$  daily.         Recommended to take Furosemide 40 mg 1 tablet daily x 3 days and Potassium 20 mEq one tablet x 3 days.  After 3 days resume prn dosage of Furosemide and Potassium.  He verbalized understanding.  Advised next ICM  transmission will be 05/29/2015 and will send copy to Dr Domenic Polite for his appointment on 05/30/2015.  Copy of note sent to patient's primary care physician, primary cardiologist, and device following physician.  [name], RN, CCM 05/07/2015 11:10 AM

## 2015-05-09 LAB — CUP PACEART REMOTE DEVICE CHECK
Battery Voltage: 3.05 V
Brady Statistic RA Percent Paced: 0.08 %
HIGH POWER IMPEDANCE MEASURED VALUE: 59 Ohm
HighPow Impedance: 48 Ohm
HighPow Impedance: 494 Ohm
Implantable Lead Implant Date: 20081014
Implantable Lead Implant Date: 20081014
Implantable Lead Location: 753858
Implantable Lead Location: 753859
Implantable Lead Location: 753860
Implantable Lead Model: 4542
Implantable Lead Model: 6947
Implantable Lead Serial Number: 128499
Lead Channel Impedance Value: 380 Ohm
Lead Channel Impedance Value: 969 Ohm
Lead Channel Pacing Threshold Amplitude: 0.5 V
Lead Channel Pacing Threshold Amplitude: 0.875 V
Lead Channel Pacing Threshold Pulse Width: 0.4 ms
Lead Channel Pacing Threshold Pulse Width: 0.4 ms
Lead Channel Sensing Intrinsic Amplitude: 1.875 mV
Lead Channel Sensing Intrinsic Amplitude: 15.75 mV
Lead Channel Sensing Intrinsic Amplitude: 15.75 mV
Lead Channel Setting Pacing Amplitude: 2.5 V
Lead Channel Setting Pacing Pulse Width: 0.4 ms
MDC IDC LEAD IMPLANT DT: 20081014
MDC IDC MSMT LEADCHNL LV IMPEDANCE VALUE: 532 Ohm
MDC IDC MSMT LEADCHNL LV IMPEDANCE VALUE: 627 Ohm
MDC IDC MSMT LEADCHNL RA PACING THRESHOLD PULSEWIDTH: 0.4 ms
MDC IDC MSMT LEADCHNL RA SENSING INTR AMPL: 1.875 mV
MDC IDC MSMT LEADCHNL RV IMPEDANCE VALUE: 589 Ohm
MDC IDC MSMT LEADCHNL RV PACING THRESHOLD AMPLITUDE: 1.125 V
MDC IDC SESS DTM: 20170301083627
MDC IDC SET LEADCHNL LV PACING AMPLITUDE: 2 V
MDC IDC SET LEADCHNL RA PACING AMPLITUDE: 2 V
MDC IDC SET LEADCHNL RV PACING PULSEWIDTH: 0.4 ms
MDC IDC SET LEADCHNL RV SENSING SENSITIVITY: 0.3 mV
MDC IDC STAT BRADY AP VP PERCENT: 0.07 %
MDC IDC STAT BRADY AP VS PERCENT: 0.01 %
MDC IDC STAT BRADY AS VP PERCENT: 99.91 %
MDC IDC STAT BRADY AS VS PERCENT: 0.01 %
MDC IDC STAT BRADY RV PERCENT PACED: 99.98 %

## 2015-05-15 ENCOUNTER — Encounter: Payer: Self-pay | Admitting: Cardiology

## 2015-05-29 ENCOUNTER — Ambulatory Visit (INDEPENDENT_AMBULATORY_CARE_PROVIDER_SITE_OTHER): Payer: Medicare Other

## 2015-05-29 DIAGNOSIS — Z95810 Presence of automatic (implantable) cardiac defibrillator: Secondary | ICD-10-CM

## 2015-05-29 DIAGNOSIS — I5022 Chronic systolic (congestive) heart failure: Secondary | ICD-10-CM

## 2015-05-29 NOTE — Progress Notes (Signed)
EPIC Encounter for ICM Monitoring  Patient Name: Paul [name]. is a 75 y.o. male Date: 05/29/2015 Primary Care Physican: Caralyn Guile, DO Primary Cardiologist: Domenic Polite Electrophysiologist: Allred Dry Weight: 162 lb   Bi-V Pacing 100%      In the past month, have you:  1. Gained more than 2 pounds in a day or more than 5 pounds in a week? no  2. Had changes in your medications (with verification of current medications)? no  3. Had more shortness of breath than is usual for you? no  4. Limited your activity because of shortness of breath? no  5. Not been able to sleep because of shortness of breath? no  6. Had increased swelling in your feet or ankles? no  7. Had symptoms of dehydration (dizziness, dry mouth, increased thirst, decreased urine output) no  8. Had changes in sodium restriction? no  9. Been compliant with medication? Yes  ICM trend: 3 month view for 05/29/2015  ICM trend: 1 year view for 05/29/2015  Follow-up plan: ICM clinic phone appointment 06/06/2015.    FLUID LEVELS: Since last ICM transmission on 05/07/2015, Optivol thoracic impedance decreased 04/30/2015 to 05/29/2015 suggesting fluid accumulation.   Thoracic impedance did improve after taking increased dosage of Furosemide on 05/07/2015 to 05/10/2015 and then worsened.   SYMPTOMS: None.  Reviewed symptoms to report.   EDUCATION:  Reminded to limit sodium intake to < 2000 mg and fluid intake to 64 oz daily.     Patient has 1st appointment with Dr Domenic Polite (replacing Dr Ron Parker) on 05/30/2015.   Repeat transmission on 06/07/2015.    Advised will send to Dr. Domenic Polite and Dr. Rayann Heman for review regarding symptoms and decreased thoracic impedance.   No changes today since patient has office appointment with Dr Domenic Polite tomorrow, 05/30/2015.    Copy of note sent to PCP.  [name], RN, CCM 05/29/2015 2:21 PM

## 2015-05-30 ENCOUNTER — Encounter: Payer: Self-pay | Admitting: Cardiology

## 2015-05-30 ENCOUNTER — Ambulatory Visit (INDEPENDENT_AMBULATORY_CARE_PROVIDER_SITE_OTHER): Payer: Medicare Other | Admitting: Cardiology

## 2015-05-30 VITALS — BP 120/78 | HR 78 | Ht 67.0 in | Wt 181.0 lb

## 2015-05-30 DIAGNOSIS — Z720 Tobacco use: Secondary | ICD-10-CM

## 2015-05-30 DIAGNOSIS — I340 Nonrheumatic mitral (valve) insufficiency: Secondary | ICD-10-CM | POA: Diagnosis not present

## 2015-05-30 DIAGNOSIS — Z95810 Presence of automatic (implantable) cardiac defibrillator: Secondary | ICD-10-CM | POA: Diagnosis not present

## 2015-05-30 DIAGNOSIS — Z8679 Personal history of other diseases of the circulatory system: Secondary | ICD-10-CM | POA: Diagnosis not present

## 2015-05-30 NOTE — Progress Notes (Signed)
Cardiology Office Note  Date: 05/30/2015   ID: Paul [name]., DOB 1940-06-26, MRN [id number]  PCP: Caralyn Guile, DO  Primary Cardiologist: Rozann Lesches, MD   Chief Complaint  Patient presents with  . Cardiomyopathy    History of Present Illness: Paul [name]. is a 75 y.o. male former patient Dr. Ron Parker, now establishing follow-up with me. This is our first meeting in the clinic. I reviewed records and updated his chart. He last saw Dr. Ron Parker in April 2016. Interval follow-up continues with Dr. Rayann Heman and through the device clinic.  Recent ICD check on April 25 shows fluctuating thoracic impedance over the last month. Most recent echocardiogram from last year shows normal LVEF of 65-70% with grade 1 diastolic dysfunction.He tells me that he uses Lasix as needed a stone either ankle edema or weight gain. His weight is up 10 pounds from last year, but he admits that he has been sedentary and his diet has been suboptimal. He does not report any orthopnea or PND, no progressive leg edema. In the past when he was on standing diuretic, he developed renal insufficiency with prerenal azotemia.  I reviewed his medications. Cardiac regimen includes Coreg, Lipitor, omega-3 supplements, Lasix as needed.  He follows concurrently through the [facility] hospital system, goes to the clinic in [place].  Past Medical History  Diagnosis Date  . Cardiomyopathy     LVEF 25% up to 65%  . ICD (implantable cardiac defibrillator) battery depletion     CRT-D device  . Mitral regurgitation     Severe before CRT with annular dilatation  . COPD (chronic obstructive pulmonary disease) ([place])   . Smoking     Chantix cause headaches  . Lung nodule     [facility]  . Essential hypertension   . Dyslipidemia   . BPH (benign prostatic hyperplasia)   . Gout   . Alcohol abuse     In the past. Stable over many years  . Migraines   . LBBB (left bundle branch block)   . CAD (coronary artery  disease)     Distal circumflex and OM disease with collaterals 2008  . Syncope     Probably orthostasis    Past Surgical History  Procedure Laterality Date  . Cardiac defibrillator placement  12/29/12    BiV ICD implanted, generator change 12/30/11 by Dr Rayann Heman MDT Protecta XT CRT-D  . Bi-ventricular implantable cardioverter defibrillator N/A 12/30/2011    Procedure: BI-VENTRICULAR IMPLANTABLE CARDIOVERTER DEFIBRILLATOR  (CRT-D);  Surgeon: Thompson Grayer, MD;  Location: [facility] CATH LAB;  Service: Cardiovascular;  Laterality: N/A;  . Orif ankle fracture Right 08/31/2014    Procedure: OPEN REDUCTION INTERNAL FIXATION (ORIF) ANKLE FRACTURE;  Surgeon: Marybelle Killings, MD;  Location: [facility];  Service: Orthopedics;  Laterality: Right;    Current Outpatient Prescriptions  Medication Sig Dispense Refill  . atorvastatin (LIPITOR) 80 MG tablet Take 80 mg by mouth daily.    . carbamide peroxide (DEBROX) 6.5 % otic solution Place 5 drops into both ears daily as needed (for ear vwax removal).    . carvedilol (COREG) 25 MG tablet Take 12.5 mg by mouth 2 (two) times daily.    . Cholecalciferol (VITAMIN D3) 2000 units TABS Take 2 tablets by mouth daily.    . diclofenac sodium (VOLTAREN) 1 % GEL Apply 2 g topically as needed.    . divalproex (DEPAKOTE) 500 MG 24 hr tablet Take 1,500 mg by mouth at bedtime.      Marland Kitchen  finasteride (PROSCAR) 5 MG tablet Take 5 mg by mouth daily.      . folic acid (FOLVITE) 1 MG tablet Take 1 mg by mouth daily.      . furosemide (LASIX) 40 MG tablet Take 0.5 tablets (20 mg total) by mouth daily as needed for fluid or edema (weight gain > 3lbs). 30 tablet   . gabapentin (NEURONTIN) 300 MG capsule Take 300-600 mg by mouth at bedtime.     . hydroxypropyl methylcellulose (ISOPTO TEARS) 2.5 % ophthalmic solution Place 1 drop into both eyes 4 (four) times daily as needed for dry eyes.     Marland Kitchen loratadine (CLARITIN) 10 MG tablet Take 10 mg by mouth daily.    . Multiple Vitamin (MULTIVITAMIN) tablet  Take 1 tablet by mouth daily.      . nitroGLYCERIN (NITROSTAT) 0.4 MG SL tablet Place 0.4 mg under the tongue every 5 (five) minutes as needed.      . Omega-3 Fatty Acids (FISH OIL) 1000 MG CAPS Take 1 capsule by mouth daily.    Marland Kitchen omeprazole (PRILOSEC) 20 MG capsule Take 20 mg by mouth daily.      . potassium chloride SA (K-DUR,KLOR-CON) 20 MEQ tablet Take 1 tablet (20 mEq total) by mouth daily as needed (In the days when you take Lasix).    Marland Kitchen tiotropium (SPIRIVA) 18 MCG inhalation capsule Place 18 mcg into inhaler and inhale every morning.       No current facility-administered medications for this visit.   Allergies:  Codeine; Morphine sulfate; and Mupirocin   Social History: The patient  reports that he has been smoking Cigarettes.  He started smoking about 59 years ago. He has a 22.5 pack-year smoking history. He has never used smokeless tobacco. He reports that he drinks alcohol. He reports that he does not use illicit drugs.   ROS:  Please see the history of present illness. Otherwise, complete review of systems is positive for mild arthritic pains, chronic shortness of breath which he attributes to long-standing tobacco use and COPD.  All other systems are reviewed and negative.   Physical Exam: VS:  BP 120/78 mmHg  Pulse 78  Ht '5\' 7"'$  (1.702 m)  Wt 181 lb (82.101 kg)  BMI 28.34 kg/m2  SpO2 98%, BMI Body mass index is 28.34 kg/(m^2).  Wt Readings from Last 3 Encounters:  05/30/15 181 lb (82.101 kg)  09/08/14 170 lb (77.111 kg)  08/30/14 163 lb (73.936 kg)    General: Patient appears comfortable at rest. HEENT: Conjunctiva and lids normal, oropharynx clear. Neck: Supple, no elevated JVP or carotid bruits, no thyromegaly. Lungs: Decreased breath sounds without wheezing, nonlabored breathing at rest. Cardiac: Distant, regular rate and rhythm, no S3 or significant systolic murmur, no pericardial rub. Abdomen: Soft, nontender, bowel sounds present, no guarding or  rebound. Extremities: Trace left ankle edema, distal pulses 2+. Skin: Warm and dry. Musculoskeletal: No kyphosis. Neuropsychiatric: Alert and oriented x3, affect grossly appropriate.  ECG: I personally reviewed the prior tracing from 08/30/2014 which showed sinus rhythm with ventricular pacing.  Recent Labwork: 08/30/2014: B Natriuretic Peptide 19.0 08/31/2014: ALT 9*; AST 14* 09/03/2014: BUN 18; Creatinine, Ser 1.29*; Hemoglobin 9.2*; Platelets 178; Potassium 4.3; Sodium 138   Other Studies Reviewed Today:  Echocardiogram 09/02/2014: Study Conclusions  - Left ventricle: The cavity size was normal. Wall thickness was  normal. Systolic function was vigorous. The estimated ejection  fraction was in the range of 65% to 70%. Doppler parameters are  consistent with abnormal  left ventricular relaxation (grade 1  diastolic dysfunction). - Ventricular septum: Septal motion showed abnormal function and  dyssynergy. These changes are consistent with intraventricular  conduction delay. - Right ventricle: Pacer wire or catheter noted in right ventricle. - Right atrium: Pacer wire or catheter noted in right atrium.  Chest x-ray 08/30/2014: FINDINGS: A single AP portable view of the chest demonstrates no focal airspace consolidation or alveolar edema. The lungs are grossly clear. There is no large effusion or pneumothorax. Cardiac and mediastinal contours appear unremarkable.  IMPRESSION: No active disease.  Assessment and Plan:  1. History of nonischemic cardiomyopathy with subsequent normalization of LVEF on medical therapy and with biventricular CRT-D. Most recent LVEF 65-70% with grade 1 diastolic dysfunction. He has had recent fluctuations in thoracic impedance. I do not plan to get more aggressive with his diuretic regimen however given prior history of renal insufficiency on standing diuretics. We talked about sodium restriction, daily weights, and using Lasix on an as-needed  basis. He will otherwise continue on Coreg for now.  2. History of significant mitral regurgitation in the prior setting of cardiomyopathy, improved.  3. [employer] biventricular ICD in place, followed by Dr. Rayann Heman. Patient denies ever having any device shocks.  4. COPD with ongoing tobacco abuse. He is making efforts to try and quit smoking. He was somewhat successful in the past, quit for about 7-8 months.  Current medicines were reviewed with the patient today.  Disposition: FU with me in 1 year.   Signed, Satira Sark, MD, [facility] 05/30/2015 1:34 PM    [facility] [place], [place], [place] [address] Phone: [phone]; Fax: [phone]

## 2015-05-30 NOTE — Patient Instructions (Signed)

## 2015-06-06 ENCOUNTER — Ambulatory Visit (INDEPENDENT_AMBULATORY_CARE_PROVIDER_SITE_OTHER): Payer: Medicare Other

## 2015-06-06 DIAGNOSIS — I5022 Chronic systolic (congestive) heart failure: Secondary | ICD-10-CM

## 2015-06-06 DIAGNOSIS — Z95810 Presence of automatic (implantable) cardiac defibrillator: Secondary | ICD-10-CM

## 2015-06-06 NOTE — Progress Notes (Signed)
EPIC Encounter for ICM Monitoring  Patient Name: Paul [name]. is a 75 y.o. male Date: 06/06/2015 Primary Care Physican: Caralyn Guile, DO Primary Cardiologist: Domenic Polite Electrophysiologist: Allred Dry Weight: 175 lbs at office last week   Bi-V Pacing 100%      In the past month, have you:  1. Gained more than 2 pounds in a day or more than 5 pounds in a week? No, patient readjusted scale after weighing at Dr McDowell's office last week.  He thinks his baseline weight is closer to 175 lbs  2. Had changes in your medications (with verification of current medications)? no  3. Had more shortness of breath than is usual for you? no  4. Limited your activity because of shortness of breath? no  5. Not been able to sleep because of shortness of breath? no  6. Had increased swelling in your feet or ankles? no  7. Had symptoms of dehydration (dizziness, dry mouth, increased thirst, decreased urine output) no  8. Had changes in sodium restriction? no  9. Been compliant with medication? Yes  ICM trend: 3 month view for 06/06/2015   ICM trend: 1 year view for 06/06/2015  Follow-up plan: ICM clinic phone appointment 07/09/2015.    FLUID LEVELS: Since last ICM transmission on 05/29/2015, Optivol thoracic impedance decreased 06/03/2015 to 06/06/2015 suggesting fluid accumulation and returned to baseline 06/06/2015 suggesting fluid levels stable.   Had office visit with Dr Domenic Polite on 05/29/2015.  Furosemide to be taken prn for fluid symptoms.   SYMPTOMS:   None. Encouraged to call for any fluid symptoms.  EDUCATION:   Reminded him Dr Domenic Polite has instructed him to limit sodium intake to < 2000 mg and fluid intake to 64 oz daily.   No changes today.    Advised will send updated transmission with stable fluid levels as FYI to Dr. Domenic Polite and Dr. Rayann Heman.     [name], RN, CCM 06/06/2015 8:55 AM

## 2015-07-09 ENCOUNTER — Telehealth: Payer: Self-pay

## 2015-07-09 ENCOUNTER — Ambulatory Visit (INDEPENDENT_AMBULATORY_CARE_PROVIDER_SITE_OTHER): Payer: Medicare Other | Admitting: *Deleted

## 2015-07-09 DIAGNOSIS — I429 Cardiomyopathy, unspecified: Secondary | ICD-10-CM

## 2015-07-09 DIAGNOSIS — Z95810 Presence of automatic (implantable) cardiac defibrillator: Secondary | ICD-10-CM | POA: Diagnosis not present

## 2015-07-09 DIAGNOSIS — I5022 Chronic systolic (congestive) heart failure: Secondary | ICD-10-CM

## 2015-07-09 NOTE — Telephone Encounter (Signed)
Remote ICM transmission received.  Attempted patient call and left message for return call.   

## 2015-07-09 NOTE — Progress Notes (Signed)
Remote ICD transmission.   

## 2015-07-09 NOTE — Progress Notes (Signed)
EPIC Encounter for ICM Monitoring  Patient Name: Paul [name]. is a 75 y.o. male Date: 07/09/2015 Primary Care Physican: Caralyn Guile, DO Primary Cardiologist: Domenic Polite Electrophysiologist: Allred Dry Weight: unknown   Bi-V Pacing 100%      In the past month, have you:  1. Gained more than 2 pounds in a day or more than 5 pounds in a week? N/A  2. Had changes in your medications (with verification of current medications)? N/A  3. Had more shortness of breath than is usual for you? N/A  4. Limited your activity because of shortness of breath? N/A  5. Not been able to sleep because of shortness of breath? N/A  6. Had increased swelling in your feet or ankles? N/A  7. Had symptoms of dehydration (dizziness, dry mouth, increased thirst, decreased urine output) N/A  8. Had changes in sodium restriction? N/A  9. Been compliant with medication? N/A   ICM trend: 3 month view for 07/09/2015   ICM trend: 1 year view for 07/09/2015   Follow-up plan: ICM clinic phone appointment on 07/19/2015 to recheck fluid levels.  Attempted call to patient and unable to reach.  Transmission reviewed.  Thoracic impedance below reference line from 07/03/2015 to 07/09/2015 suggesting fluid accumulation.  Patient has Furosemide prn to take when needed.    [name], RN, CCM 07/09/2015 1:03 PM

## 2015-07-10 ENCOUNTER — Telehealth: Payer: Self-pay

## 2015-07-10 NOTE — Telephone Encounter (Signed)
Returned call to patient.  Left message to call back.

## 2015-07-10 NOTE — Progress Notes (Signed)
Spoke with patient.  Reviewed transmission and decreased thoracic impedance since 07/03/2015.  He denied any symptoms.  He reported he drinks a very large amount of fluids daily and probably more than 64 oz.  Advised to limit to 64 oz daily.  He reported he has been taking Lasix more often and taking 40 mg instead of 1/2 tablet as prescribed.  Advised to take Lasix 40 mg x 3 days and should return to prn after 3rd day.  Explained at the office visit with Dr Domenic Polite, he has instructed patient to only take as needed due to history of renal insufficiency.  He verbalized understanding.  Patient reported he may be eating foods higher in salt but reported he knows this will cause fluid retention.  His current weight is  175 lbs.  Requested he weigh daily at same time with same amount of clothes and record the weights.  Will repeat transmission on 07/19/2015.

## 2015-07-19 ENCOUNTER — Ambulatory Visit (INDEPENDENT_AMBULATORY_CARE_PROVIDER_SITE_OTHER): Payer: Medicare Other

## 2015-07-19 DIAGNOSIS — Z95810 Presence of automatic (implantable) cardiac defibrillator: Secondary | ICD-10-CM

## 2015-07-19 DIAGNOSIS — I5022 Chronic systolic (congestive) heart failure: Secondary | ICD-10-CM

## 2015-07-19 LAB — CUP PACEART REMOTE DEVICE CHECK
Battery Voltage: 3.01 V
Brady Statistic AP VP Percent: 0.09 %
Brady Statistic AS VP Percent: 99.89 %
Brady Statistic RA Percent Paced: 0.11 %
Date Time Interrogation Session: 20170605114631
HIGH POWER IMPEDANCE MEASURED VALUE: 48 Ohm
HighPow Impedance: 494 Ohm
HighPow Impedance: 58 Ohm
Implantable Lead Implant Date: 20081014
Implantable Lead Location: 753858
Implantable Lead Location: 753860
Implantable Lead Model: 5076
Implantable Lead Serial Number: 128499
Lead Channel Impedance Value: 380 Ohm
Lead Channel Impedance Value: 988 Ohm
Lead Channel Pacing Threshold Amplitude: 0.625 V
Lead Channel Pacing Threshold Amplitude: 0.875 V
Lead Channel Pacing Threshold Amplitude: 1 V
Lead Channel Pacing Threshold Pulse Width: 0.4 ms
Lead Channel Pacing Threshold Pulse Width: 0.4 ms
Lead Channel Sensing Intrinsic Amplitude: 1.375 mV
Lead Channel Sensing Intrinsic Amplitude: 23.625 mV
Lead Channel Sensing Intrinsic Amplitude: 23.625 mV
Lead Channel Setting Pacing Amplitude: 2 V
Lead Channel Setting Pacing Amplitude: 2 V
Lead Channel Setting Pacing Amplitude: 2.5 V
Lead Channel Setting Pacing Pulse Width: 0.4 ms
Lead Channel Setting Sensing Sensitivity: 0.3 mV
MDC IDC LEAD IMPLANT DT: 20081014
MDC IDC LEAD IMPLANT DT: 20081014
MDC IDC LEAD LOCATION: 753859
MDC IDC MSMT LEADCHNL LV IMPEDANCE VALUE: 570 Ohm
MDC IDC MSMT LEADCHNL LV IMPEDANCE VALUE: 646 Ohm
MDC IDC MSMT LEADCHNL RA SENSING INTR AMPL: 1.375 mV
MDC IDC MSMT LEADCHNL RV IMPEDANCE VALUE: 570 Ohm
MDC IDC MSMT LEADCHNL RV PACING THRESHOLD PULSEWIDTH: 0.4 ms
MDC IDC SET LEADCHNL LV PACING PULSEWIDTH: 0.4 ms
MDC IDC STAT BRADY AP VS PERCENT: 0.02 %
MDC IDC STAT BRADY AS VS PERCENT: 0 %
MDC IDC STAT BRADY RV PERCENT PACED: 99.98 %

## 2015-07-19 NOTE — Progress Notes (Signed)
Reviewed. Patient has normal LVEF with mild diastolic dysfunction by his last echocardiogram. He has a history of renal insufficiency that was made worse by standing diuretics based on review of the records. I have met him once, former patient of Dr. Ron Parker. If he is taking standing Lasix at this time, he needs to have a follow-up BMET to reassess renal function. Hard to know what to do with his diuretic regimen despite the thoracic impedance measurements, if in fact he is not having any active symptoms and his weight is not changing.

## 2015-07-19 NOTE — Progress Notes (Signed)
EPIC Encounter for ICM Monitoring  Patient Name: Paul [name]. is a 75 y.o. male Date: 07/19/2015 Primary Care Physican: Caralyn Guile, DO Primary Cardiologist: Domenic Polite Electrophysiologist: Allred Dry Weight: 175 lbs   Bi-V Pacing 99.9%      In the past month, have you:  1. Gained more than 2 pounds in a day or more than 5 pounds in a week? No  2. Had changes in your medications (with verification of current medications)? No  3. Had more shortness of breath than is usual for you? No   4. Limited your activity because of shortness of breath? No   5. Not been able to sleep because of shortness of breath? No   6. Had increased swelling in your feet, ankles, legs or stomach area? No   7. Had symptoms of dehydration (dizziness, dry mouth, increased thirst, decreased urine output) No   8. Had changes in sodium restriction? No   9. Been compliant with medication? No, patient is taking Furosemide 40 mg for prevention of fluid and does not have symptoms.  Explained today and last call on 07/09/2015, Dr Domenic Polite has prescribed Furosemide '40mg'$  1/2 tablet prn for edema or weight gain of 3 lbs.   Explained the effects Furosemide has on the kidneys and he stated he understands.    ICM trend: 3 month view for 07/19/2015   ICM trend: 1 year view for 07/19/2015   Follow-up plan: ICM clinic phone appointment 07/30/2015.    FLUID LEVELS:  Since 07/09/2015 ICM transmission, Optivol thoracic impedance continues to be decreased 07/02/2015 to 07/19/2015 suggesting fluid accumulation despite taking Furosemide x 3 days on 07/10/2015 and continued to take prn dose frequently.    SYMPTOMS:  None.  Denied any symptoms such as weight gain of 3 pounds overnight or 5 pounds within a week, SOB and/or lower extremity swelling. Encouraged to call for any fluid symptoms.   LABS: 09/29/2014 Creatinine 1.62, BUN 26, Potassium 4.6, Sodium 139  RECOMMENDATIONS:  Patient took Furosemide 40 mg x 3 days as recommended  on 07/09/2015 but did not return to prn for symptoms.  Since 07/09/2015 he has continued to take Furosemide 40 mg daily for most days.    Advised will send to PCP, Dr. Domenic Polite and Dr. Rayann Heman for review and recommendations regarding continued decrease in thoracic impedance.   [name], RN, CCM 07/19/2015 9:16 AM

## 2015-07-20 NOTE — Progress Notes (Signed)
Attempted call to patient to advise of Dr McDowell's recommendations.  No answer and left message for return call.  Will order BMET and advise patient to take Lasix prn as ordered at this time.

## 2015-07-20 NOTE — Progress Notes (Signed)
Attempted 2nd call to patient to advise of Dr McDowell's recommendations.  No answer.

## 2015-07-23 NOTE — Progress Notes (Signed)
Spoke with patient.  Advised he will need to have lab work drawn this week.  He reported he can go to [facility] on 07/25/2015 for labs.  He stated he does only take 1/2 tablet of Furosemide 40 mg if needed.  Explained it should be taken when he has symptoms such as edema or weight gain >/= 3 lbs.  He verbalized understanding.

## 2015-07-25 ENCOUNTER — Encounter: Payer: Self-pay | Admitting: Cardiology

## 2015-07-25 DIAGNOSIS — I5022 Chronic systolic (congestive) heart failure: Secondary | ICD-10-CM | POA: Diagnosis not present

## 2015-07-26 ENCOUNTER — Other Ambulatory Visit: Payer: Self-pay | Admitting: *Deleted

## 2015-07-26 DIAGNOSIS — I5022 Chronic systolic (congestive) heart failure: Secondary | ICD-10-CM

## 2015-07-30 ENCOUNTER — Telehealth: Payer: Self-pay | Admitting: *Deleted

## 2015-07-30 ENCOUNTER — Ambulatory Visit (INDEPENDENT_AMBULATORY_CARE_PROVIDER_SITE_OTHER): Payer: Medicare Other

## 2015-07-30 DIAGNOSIS — Z95810 Presence of automatic (implantable) cardiac defibrillator: Secondary | ICD-10-CM

## 2015-07-30 DIAGNOSIS — I5022 Chronic systolic (congestive) heart failure: Secondary | ICD-10-CM

## 2015-07-30 NOTE — Telephone Encounter (Signed)
-----   Message from Satira Sark, MD sent at 07/26/2015  9:43 AM EDT ----- Results reviewed. Creatinine is 1.48, somewhat higher than in the past. Would try to avoid using Lasix on a daily basis, it sounds like he has been using it only as needed. Potassium is normal. A copy of this test should be forwarded to [place], DO.

## 2015-07-30 NOTE — Progress Notes (Signed)
EPIC Encounter for ICM Monitoring  Patient Name: Paul [name]. is a 75 y.o. male Date: 07/30/2015 Primary Care Physican: Caralyn Guile, DO Primary Cardiologist: Domenic Polite Electrophysiologist: Allred Dry Weight:  unknown  Bi-V Pacing 100%      In the past month, have you:  1. Gained more than 2 pounds in a day or more than 5 pounds in a week? Unknown, does not weigh  2. Had changes in your medications (with verification of current medications)? No  3. Had more shortness of breath than is usual for you? No   4. Limited your activity because of shortness of breath? No   5. Not been able to sleep because of shortness of breath? No   6. Had increased swelling in your feet, ankles, legs or stomach area? No   7. Had symptoms of dehydration (dizziness, dry mouth, increased thirst, decreased urine output) No   8. Had changes in sodium restriction? No   9. Been compliant with medication? Yes   ICM trend: 3 month view for 07/30/2015   ICM trend: 1 year view for 07/30/2015   Follow-up plan: ICM clinic phone appointment 08/30/2015.    FLUID LEVELS:  Since 07/19/2015 ICM transmission, Optivol thoracic impedance has returned to baseline after patient had been taking Furosemide 40 mg several times a week for prevention of fluid instead of prn as prescribed.    SYMPTOMS:   None, denied any fluid symptoms such as weight gain of 3 pounds overnight or 5 pounds within a week, SOB and/or lower extremity swelling. Encouraged to call for any fluid symptoms.   EDUCATION: Limit sodium intake to < 2000 mg and fluid intake to 64 oz daily.     RECOMMENDATIONS: No changes today.  Advised to take Furosemide prn for fluid symptoms as instructed at office visit with Dr Domenic Polite on 05/30/2015.  He verbalized understanding.  Will send to PCP, Dr. Domenic Polite and Dr. Rayann Heman for updated ICM transmission showing impedance has returned to baseline.      [name], RN, CCM 07/30/2015 9:15 AM

## 2015-08-16 NOTE — Telephone Encounter (Signed)
Daughter, Abdirahim Flavell informed and verbalized understanding of plan. Copy sent to PCP.

## 2015-08-30 ENCOUNTER — Ambulatory Visit (INDEPENDENT_AMBULATORY_CARE_PROVIDER_SITE_OTHER): Payer: Medicare Other

## 2015-08-30 DIAGNOSIS — I5022 Chronic systolic (congestive) heart failure: Secondary | ICD-10-CM | POA: Diagnosis not present

## 2015-08-30 DIAGNOSIS — Z95810 Presence of automatic (implantable) cardiac defibrillator: Secondary | ICD-10-CM | POA: Diagnosis not present

## 2015-08-31 ENCOUNTER — Telehealth: Payer: Self-pay

## 2015-08-31 NOTE — Telephone Encounter (Signed)
Remote ICM transmission received.  Attempted patient call and left message for return call.   

## 2015-08-31 NOTE — Progress Notes (Signed)
EPIC Encounter for ICM Monitoring  Patient Name: Paul [name]. is a 75 y.o. male Date: 08/31/2015 Primary Care Physican: Caralyn Guile, DO Primary Cardiologist: Domenic Polite Electrophysiologist: Allred Dry Weight: unknown Bi-V Pacing:  100%       Attempted patient call and unable to reach.  Transmission reviewed.   Thoracic impedance decreased suggesting fluid accumulation 08/18/2015 to 08/30/2015.  Patient has office appointment with Dr Rayann Heman on 09/07/2015  ICM trend: 08/30/2015    Follow-up plan: ICM clinic phone appointment on 09/19/2015.  Copy of ICM check sent to device physician.   [name], RN 08/31/2015 9:09 AM

## 2015-08-31 NOTE — Telephone Encounter (Signed)
error 

## 2015-09-04 ENCOUNTER — Ambulatory Visit (INDEPENDENT_AMBULATORY_CARE_PROVIDER_SITE_OTHER): Payer: Medicare Other

## 2015-09-04 ENCOUNTER — Telehealth: Payer: Self-pay

## 2015-09-04 DIAGNOSIS — Z95810 Presence of automatic (implantable) cardiac defibrillator: Secondary | ICD-10-CM

## 2015-09-04 DIAGNOSIS — I5022 Chronic systolic (congestive) heart failure: Secondary | ICD-10-CM

## 2015-09-04 NOTE — Telephone Encounter (Signed)
Received patient call.  He was returning my call from last week.  Advised the ICM remote transmission on 08/30/2015 showed fluid accumulation.  He has an office appointment with Dr Rayann Heman on Friday, 09/07/2015.  Requested he send remote transmission today for updated view of fluid levels. He stated he would send it today.

## 2015-09-04 NOTE — Progress Notes (Addendum)
EPIC Encounter for ICM Monitoring  Patient Name: Paul [name]. is a 75 y.o. male Date: 09/04/2015 Primary Care Physican: Caralyn Guile, DO Primary Cardiologist: Domenic Polite Electrophysiologist: Allred Dry Weight: 172 lb  Bi-V Pacing:  100%       Heart Failure questions reviewed, pt asymptomatic   Thoracic impedance decreased suggesting fluid accumulation 08/18/2015 to 09/04/2015.  Recent Labwork: 07/25/2015 Creatinine 1.48, BUN 26, Potassium 4.1, Sodium 141, eGFR 46  Recommendations:  Furosemide is prescribed prn.  Advised to take 1/2 tablet of Furosemide 40 mg x 3 days and then return to prn.  Take Potassium 20 mEq 1 tablet x 3 days and then return to prn.  Patient has appointment with Dr Rayann Heman on 09/07/2015.  ICM trend: 09/04/2015     Follow-up plan: ICM clinic phone appointment on 09/19/2015. Appointment with Dr Rayann Heman on 09/07/2015.  Copy of ICM check sent to primary cardiologist and device physician.   [name], RN 09/04/2015 12:38 PM

## 2015-09-07 ENCOUNTER — Ambulatory Visit (INDEPENDENT_AMBULATORY_CARE_PROVIDER_SITE_OTHER): Payer: Medicare Other | Admitting: Internal Medicine

## 2015-09-07 ENCOUNTER — Encounter: Payer: Self-pay | Admitting: Internal Medicine

## 2015-09-07 VITALS — BP 129/83 | HR 59 | Ht 67.0 in | Wt 172.6 lb

## 2015-09-07 DIAGNOSIS — I5022 Chronic systolic (congestive) heart failure: Secondary | ICD-10-CM

## 2015-09-07 DIAGNOSIS — Z95810 Presence of automatic (implantable) cardiac defibrillator: Secondary | ICD-10-CM | POA: Diagnosis not present

## 2015-09-07 DIAGNOSIS — I447 Left bundle-branch block, unspecified: Secondary | ICD-10-CM | POA: Diagnosis not present

## 2015-09-07 DIAGNOSIS — I429 Cardiomyopathy, unspecified: Secondary | ICD-10-CM | POA: Diagnosis not present

## 2015-09-07 DIAGNOSIS — Z720 Tobacco use: Secondary | ICD-10-CM

## 2015-09-07 DIAGNOSIS — IMO0001 Reserved for inherently not codable concepts without codable children: Secondary | ICD-10-CM

## 2015-09-07 DIAGNOSIS — F17200 Nicotine dependence, unspecified, uncomplicated: Secondary | ICD-10-CM

## 2015-09-07 LAB — CUP PACEART INCLINIC DEVICE CHECK
Brady Statistic AP VS Percent: 0.01 %
Brady Statistic AS VP Percent: 99.88 %
Brady Statistic RV Percent Paced: 99.97 %
HIGH POWER IMPEDANCE MEASURED VALUE: 494 Ohm
HIGH POWER IMPEDANCE MEASURED VALUE: 70 Ohm
HighPow Impedance: 54 Ohm
Implantable Lead Implant Date: 20081014
Implantable Lead Implant Date: 20081014
Implantable Lead Location: 753859
Implantable Lead Model: 4542
Implantable Lead Model: 5076
Implantable Lead Serial Number: 128499
Lead Channel Impedance Value: 1026 Ohm
Lead Channel Impedance Value: 589 Ohm
Lead Channel Impedance Value: 627 Ohm
Lead Channel Impedance Value: 665 Ohm
Lead Channel Pacing Threshold Amplitude: 0.75 V
Lead Channel Pacing Threshold Pulse Width: 0.4 ms
Lead Channel Pacing Threshold Pulse Width: 0.4 ms
Lead Channel Sensing Intrinsic Amplitude: 17.125 mV
Lead Channel Setting Pacing Amplitude: 2 V
Lead Channel Setting Pacing Amplitude: 2.5 V
Lead Channel Setting Pacing Pulse Width: 0.4 ms
Lead Channel Setting Pacing Pulse Width: 0.4 ms
Lead Channel Setting Sensing Sensitivity: 0.3 mV
MDC IDC LEAD IMPLANT DT: 20081014
MDC IDC LEAD LOCATION: 753858
MDC IDC LEAD LOCATION: 753860
MDC IDC MSMT BATTERY VOLTAGE: 3.01 V
MDC IDC MSMT LEADCHNL LV PACING THRESHOLD AMPLITUDE: 0.5 V
MDC IDC MSMT LEADCHNL RA IMPEDANCE VALUE: 437 Ohm
MDC IDC MSMT LEADCHNL RA SENSING INTR AMPL: 1.875 mV
MDC IDC MSMT LEADCHNL RA SENSING INTR AMPL: 2.625 mV
MDC IDC MSMT LEADCHNL RV PACING THRESHOLD AMPLITUDE: 0.75 V
MDC IDC MSMT LEADCHNL RV PACING THRESHOLD PULSEWIDTH: 0.4 ms
MDC IDC MSMT LEADCHNL RV SENSING INTR AMPL: 17.125 mV
MDC IDC SESS DTM: 20170804120528
MDC IDC SET LEADCHNL LV PACING AMPLITUDE: 2 V
MDC IDC STAT BRADY AP VP PERCENT: 0.09 %
MDC IDC STAT BRADY AS VS PERCENT: 0.02 %
MDC IDC STAT BRADY RA PERCENT PACED: 0.09 %

## 2015-09-07 NOTE — Patient Instructions (Signed)
Your physician recommends that you schedule a follow-up appointment in: [address] [place]  Your physician recommends that you continue on your current medications as directed. Please refer to the Current Medication list given to you today.  Remote monitoring is used to monitor your Pacemaker of ICD from home. This monitoring reduces the number of office visits required to check your device to one time per year. It allows [place] to keep an eye on the functioning of your device to ensure it is working properly. You are scheduled for a device check from home on 12/10/15. You may send your transmission at any time that day. If you have a wireless device, the transmission will be sent automatically. After your physician reviews your transmission, you will receive a postcard with your next transmission date.  Thank you for choosing [facility]!!  Smoking Cessation, Tips for Success If you are ready to quit smoking, congratulations! You have chosen to help yourself be healthier. Cigarettes bring nicotine, tar, carbon monoxide, and other irritants into your body. Your lungs, heart, and blood vessels will be able to work better without these poisons. There are many different ways to quit smoking. Nicotine gum, nicotine patches, a nicotine inhaler, or nicotine nasal spray can help with physical craving. Hypnosis, support groups, and medicines help break the habit of smoking. WHAT THINGS CAN I DO TO MAKE QUITTING EASIER?  Here are some tips to help you quit for good:  Pick a date when you will quit smoking completely. Tell all of your friends and family about your plan to quit on that date.  Do not try to slowly cut down on the number of cigarettes you are smoking. Pick a quit date and quit smoking completely starting on that day.  Throw away all cigarettes.   Clean and remove all ashtrays from your home, work, and car.  On a card, write down your reasons for quitting. Carry the card with you  and read it when you get the urge to smoke.  Cleanse your body of nicotine. Drink enough water and fluids to keep your urine clear or pale yellow. Do this after quitting to flush the nicotine from your body.  Learn to predict your moods. Do not let a bad situation be your excuse to have a cigarette. Some situations in your life might tempt you into wanting a cigarette.  Never have "just one" cigarette. It leads to wanting another and another. Remind yourself of your decision to quit.  Change habits associated with smoking. If you smoked while driving or when feeling stressed, try other activities to replace smoking. Stand up when drinking your coffee. Brush your teeth after eating. Sit in a different chair when you read the paper. Avoid alcohol while trying to quit, and try to drink fewer caffeinated beverages. Alcohol and caffeine may urge you to smoke.  Avoid foods and drinks that can trigger a desire to smoke, such as sugary or spicy foods and alcohol.  Ask people who smoke not to smoke around you.  Have something planned to do right after eating or having a cup of coffee. For example, plan to take a walk or exercise.  Try a relaxation exercise to calm you down and decrease your stress. Remember, you may be tense and nervous for the first 2 weeks after you quit, but this will pass.  Find new activities to keep your hands busy. Play with a pen, coin, or rubber band. Doodle or draw things on paper.  Brush your teeth right after eating. This will help cut down on the craving for the taste of tobacco after meals. You can also try mouthwash.   Use oral substitutes in place of cigarettes. Try using lemon drops, carrots, cinnamon sticks, or chewing gum. Keep them handy so they are available when you have the urge to smoke.  When you have the urge to smoke, try deep breathing.  Designate your home as a nonsmoking area.  If you are a heavy smoker, ask your health care provider about a  prescription for nicotine chewing gum. It can ease your withdrawal from nicotine.  Reward yourself. Set aside the cigarette money you save and buy yourself something nice.  Look for support from others. Join a support group or smoking cessation program. Ask someone at home or at work to help you with your plan to quit smoking.  Always ask yourself, "Do I need this cigarette or is this just a reflex?" Tell yourself, "Today, I choose not to smoke," or "I do not want to smoke." You are reminding yourself of your decision to quit.  Do not replace cigarette smoking with electronic cigarettes (commonly called e-cigarettes). The safety of e-cigarettes is unknown, and some may contain harmful chemicals.  If you relapse, do not give up! Plan ahead and think about what you will do the next time you get the urge to smoke. HOW WILL I FEEL WHEN I QUIT SMOKING? You may have symptoms of withdrawal because your body is used to nicotine (the addictive substance in cigarettes). You may crave cigarettes, be irritable, feel very hungry, cough often, get headaches, or have difficulty concentrating. The withdrawal symptoms are only temporary. They are strongest when you first quit but will go away within 10-14 days. When withdrawal symptoms occur, stay in control. Think about your reasons for quitting. Remind yourself that these are signs that your body is healing and getting used to being without cigarettes. Remember that withdrawal symptoms are easier to treat than the major diseases that smoking can cause.  Even after the withdrawal is over, expect periodic urges to smoke. However, these cravings are generally short lived and will go away whether you smoke or not. Do not smoke! WHAT RESOURCES ARE AVAILABLE TO HELP ME QUIT SMOKING? Your health care provider can direct you to community resources or hospitals for support, which may include:  Group support.  Education.  Hypnosis.  Therapy.   This information is  not intended to replace advice given to you by your health care provider. Make sure you discuss any questions you have with your health care provider.   Document Released: 10/19/2003 Document Revised: 02/10/2014 Document Reviewed: 07/08/2012 Elsevier Interactive Patient Education [employer].

## 2015-09-07 NOTE — Progress Notes (Signed)
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 is a 75 y.o. male who presents today for routine electrophysiology followup. He is doing well. Today, he denies symptoms of palpitations, chest pain, shortness of breath,  Lower extremity edema, syncope, or ICD shocks.  The patient is otherwise without complaint today.   Past Medical History:  Diagnosis Date  . Alcohol abuse    In the past. Stable over many years  . BPH (benign prostatic hyperplasia)   . CAD (coronary artery disease)    Distal circumflex and OM disease with collaterals 2008  . Cardiomyopathy    LVEF 25% up to 65%  . COPD (chronic obstructive pulmonary disease) ([place])   . Dyslipidemia   . Essential hypertension   . Gout   . ICD (implantable cardiac defibrillator) battery depletion    CRT-D device  . LBBB (left bundle branch block)   . Lung nodule    [facility]  . Migraines   . Mitral regurgitation    Severe before CRT with annular dilatation  . Smoking    Chantix cause headaches  . Syncope    Probably orthostasis   Past Surgical History:  Procedure Laterality Date  . BI-VENTRICULAR IMPLANTABLE CARDIOVERTER DEFIBRILLATOR N/A 12/30/2011   Procedure: BI-VENTRICULAR IMPLANTABLE CARDIOVERTER DEFIBRILLATOR  (CRT-D);  Surgeon: Thompson Grayer, MD;  Location: [facility] CATH LAB;  Service: Cardiovascular;  Laterality: N/A;  . CARDIAC DEFIBRILLATOR PLACEMENT  12/29/12   BiV ICD implanted, generator change 12/30/11 by Dr Rayann Heman MDT Protecta XT CRT-D  . ORIF ANKLE FRACTURE Right 08/31/2014   Procedure: OPEN REDUCTION INTERNAL FIXATION (ORIF) ANKLE FRACTURE;  Surgeon: Marybelle Killings, MD;  Location: [facility];  Service: Orthopedics;  Laterality: Right;    Current Outpatient Prescriptions  Medication Sig Dispense Refill  . albuterol (PROVENTIL HFA;VENTOLIN HFA) 108 (90 Base) MCG/ACT inhaler Inhale 2 puffs into the lungs every 4 (four) hours as needed for wheezing or shortness of breath.    . Albuterol  Sulfate 108 (90 Base) MCG/ACT AEPB Inhale into the lungs.    Marland Kitchen atorvastatin (LIPITOR) 80 MG tablet Take 80 mg by mouth daily.    . carbamide peroxide (DEBROX) 6.5 % otic solution Place 5 drops into both ears daily as needed (for ear vwax removal).    . carvedilol (COREG) 25 MG tablet Take 12.5 mg by mouth 2 (two) times daily.    . cetirizine (ZYRTEC) 10 MG tablet Take 10 mg by mouth daily.    . Cholecalciferol (VITAMIN D3) 2000 units TABS Take 2 tablets by mouth daily.    . diclofenac sodium (VOLTAREN) 1 % GEL Apply 2 g topically as needed.    . divalproex (DEPAKOTE) 500 MG 24 hr tablet Take 1,500 mg by mouth at bedtime.      . finasteride (PROSCAR) 5 MG tablet Take 5 mg by mouth daily.      . folic acid (FOLVITE) 1 MG tablet Take 1 mg by mouth daily.      . furosemide (LASIX) 40 MG tablet Take 0.5 tablets (20 mg total) by mouth daily as needed for fluid or edema (weight gain > 3lbs). 30 tablet   . gabapentin (NEURONTIN) 300 MG capsule Take 300-600 mg by mouth at bedtime.     . hydroxypropyl methylcellulose (ISOPTO TEARS) 2.5 % ophthalmic solution Place 1 drop into both eyes 4 (four) times daily as needed for dry eyes.     Marland Kitchen loratadine (CLARITIN) 10 MG tablet Take 10  mg by mouth daily.    . Multiple Vitamin (MULTIVITAMIN) tablet Take 1 tablet by mouth daily.      . nitroGLYCERIN (NITROSTAT) 0.4 MG SL tablet Place 0.4 mg under the tongue every 5 (five) minutes as needed.      . Omega-3 Fatty Acids (FISH OIL) 1000 MG CAPS Take 1 capsule by mouth daily.    Marland Kitchen omeprazole (PRILOSEC) 20 MG capsule Take 20 mg by mouth daily.      . potassium chloride SA (K-DUR,KLOR-CON) 20 MEQ tablet Take 1 tablet (20 mEq total) by mouth daily as needed (In the days when you take Lasix).    Marland Kitchen tiotropium (SPIRIVA) 18 MCG inhalation capsule Place 18 mcg into inhaler and inhale every morning.       No current facility-administered medications for this visit.     Physical Exam: Vitals:   09/07/15 1110  BP: 129/83    Pulse: (!) 59  SpO2: 100%  Weight: 172 lb 9.6 oz (78.3 kg)  Height: '5\' 7"'$  (1.702 m)    GEN- The patient is well appearing, alert and oriented x 3 today.   Head- normocephalic, atraumatic Eyes-  Sclera clear, conjunctiva pink Ears- hearing intact Oropharynx- clear Lungs- Clear to ausculation bilaterally, normal work of breathing Chest- ICD pocket is well healed Heart- Regular rate and rhythm, no murmurs, rubs or gallops, PMI not laterally displaced GI- soft, NT, ND, + BS Extremities- no clubbing, cyanosis, or edema, R ankle in a cast Psych- verbose but pleasant  ICD interrogation- reviewed in detail today,  See PACEART report  Assessment and Plan:  1.  Chronic systolic dysfunction euvolemic today Stable on an appropriate medical regimen Normal ICD function See Pace Art report No changes today Recently elevated optivol, now back to baseline with prn lasix  2. Tobacco Smoking cessation encouraged  carelink every 3 months Return in 1 year Followed by laurie short in the [facility] clinic  Return to see Dr Domenic Polite as scheduled  Thompson Grayer MD, [facility] 09/07/2015 12:01 PM

## 2015-09-19 ENCOUNTER — Ambulatory Visit (INDEPENDENT_AMBULATORY_CARE_PROVIDER_SITE_OTHER): Payer: Medicare Other

## 2015-09-19 DIAGNOSIS — Z95810 Presence of automatic (implantable) cardiac defibrillator: Secondary | ICD-10-CM

## 2015-09-19 DIAGNOSIS — I5022 Chronic systolic (congestive) heart failure: Secondary | ICD-10-CM

## 2015-09-19 NOTE — Progress Notes (Signed)
EPIC Encounter for ICM Monitoring  Patient Name: Paul [name]. is a 75 y.o. male Date: 09/19/2015 Primary Care Physican: Caralyn Guile, DO Primary Cardiologist: Domenic Polite Electrophysiologist: Allred Dry Weight: 169 lb  Bi-V Pacing:  100%       Heart Failure questions reviewed, pt reported feeling a little bloated in stomach.   Thoracic impedance abnormal suggesting fluid accumulation but returned to baseline 09/19/2015.  Recommendations:   Patient reported he has taken the prn Lasix and potassium for the last couple of days which relieved his bloating feeling.   He is going to take Lasix for the next couple of days and advised to return to prn after the next 2 days.   Follow-up plan: ICM clinic phone appointment on 10/23/2015.  Copy of ICM check sent to primary cardiologist and device physician.   ICM trend: 09/19/2015       [name], RN 09/19/2015 8:43 AM

## 2015-10-23 ENCOUNTER — Telehealth: Payer: Self-pay

## 2015-10-23 ENCOUNTER — Ambulatory Visit (INDEPENDENT_AMBULATORY_CARE_PROVIDER_SITE_OTHER): Payer: Medicare Other

## 2015-10-23 DIAGNOSIS — I5022 Chronic systolic (congestive) heart failure: Secondary | ICD-10-CM | POA: Diagnosis not present

## 2015-10-23 DIAGNOSIS — Z95810 Presence of automatic (implantable) cardiac defibrillator: Secondary | ICD-10-CM

## 2015-10-23 NOTE — Telephone Encounter (Signed)
EPIC Encounter for ICM Monitoring  Remote ICM transmission received.  Attempted patient call and left message for return call.

## 2015-10-23 NOTE — Progress Notes (Signed)
EPIC Encounter for ICM Monitoring  Patient Name: Paul [name]. is a 75 y.o. male Date: 10/23/2015 Primary Care Physican: Caralyn Guile, DO Primary Cardiologist: Domenic Polite Electrophysiologist: Allred Dry Weight: unknown Bi-V Pacing:  100%       Attempted ICM call and unable to reach.  Transmission reviewed.   Thoracic impedance normal today.  Impedance trends below and at baseline in last month.   Recommendations:  None, unable to reach  Follow-up plan: ICM clinic phone appointment on 11/23/2015.  Copy of ICM check sent to device physician.   ICM trend: 10/23/2015       [name], RN 10/23/2015 8:44 AM

## 2015-10-23 NOTE — Progress Notes (Signed)
Patient returned call and stated he is doing well.  He denied any fluid symptoms.  He has taken a few doses of his prn Furosemide dose when needed.  No changes today.  Next ICM remote transmission 11/23/2015.

## 2015-11-23 ENCOUNTER — Ambulatory Visit (INDEPENDENT_AMBULATORY_CARE_PROVIDER_SITE_OTHER): Payer: Medicare Other

## 2015-11-23 DIAGNOSIS — I5022 Chronic systolic (congestive) heart failure: Secondary | ICD-10-CM | POA: Diagnosis not present

## 2015-11-23 DIAGNOSIS — Z95810 Presence of automatic (implantable) cardiac defibrillator: Secondary | ICD-10-CM

## 2015-11-23 NOTE — Progress Notes (Signed)
EPIC Encounter for ICM Monitoring  Patient Name: Paul [name]. is a 75 y.o. male Date: 11/23/2015 Primary Care Physican: PIVA,ENRICO E, DO Primary Cardiologist:McDowell Electrophysiologist: Allred Dry Weight:    unknown Bi-V Pacing:  99.9%               Heart Failure questions reviewed, pt asymptomatic   Thoracic impedance abnormal suggesting fluid accumulation.  Recent Labwork: 07/25/2015 Creatinine 1.48, BUN 26, Potassium 4.1, Sodium 141, eGFR 46  Recommendations:  Take Furosemide 40 mg 1/2 tablet (20 mg total) x 3 days and then return to prn as prescribed.  Potassium 20 mEq 1 tablet x 3 days and then return to prn on the days when Lasix is taken.  He verbalized understanding and repeated instructions.   Follow-up plan: ICM clinic phone appointment on 11/26/2015 to recheck fluid levels.  Copy of ICM check sent to primary cardiologist and device physician.   ICM trend: 11/23/2015       [name], RN 11/23/2015 9:01 AM

## 2015-11-26 ENCOUNTER — Telehealth: Payer: Self-pay

## 2015-11-26 ENCOUNTER — Ambulatory Visit (INDEPENDENT_AMBULATORY_CARE_PROVIDER_SITE_OTHER): Payer: Medicare Other

## 2015-11-26 DIAGNOSIS — Z95810 Presence of automatic (implantable) cardiac defibrillator: Secondary | ICD-10-CM

## 2015-11-26 DIAGNOSIS — I5022 Chronic systolic (congestive) heart failure: Secondary | ICD-10-CM

## 2015-11-26 NOTE — Telephone Encounter (Signed)
Remote ICM transmission received.  Attempted patient call and left message to return call.   

## 2015-11-26 NOTE — Progress Notes (Signed)
EPIC Encounter for ICM Monitoring  Patient Name: Paul [name]. is a 75 y.o. male Date: 11/26/2015 Primary Care Physican: Caralyn Guile, DO Primary Cardiologist: Domenic Polite Electrophysiologist: Allred Dry Weight:  unknown Bi-V Pacing:  99.9%       Attempted ICM call and unable to reach.   Transmission reviewed.   Thoracic impedance returned to normal after taking Furosemide 40 mg 1/2 tablet x 3 days.   Recent Labwork: 07/25/2015 Creatinine 1.48, BUN 26, Potassium 4.1, Sodium 141, eGFR 46  Follow-up plan: ICM clinic phone appointment on 12/24/2015.  Copy of ICM check sent to primary cardiologist and device physician for updated transmission and impedance back to normal.   ICM trend: 11/26/2015       [name], RN 11/26/2015 2:27 PM

## 2015-11-27 NOTE — Progress Notes (Signed)
Patient returned call.  Reviewed transmission.  Advised to continue to limit salt intake.  No changes today.  Next ICM transmission scheduled for 12/24/2015.

## 2015-12-10 ENCOUNTER — Encounter: Payer: Medicare Other | Admitting: *Deleted

## 2015-12-10 ENCOUNTER — Telehealth: Payer: Self-pay | Admitting: Cardiology

## 2015-12-10 NOTE — Telephone Encounter (Signed)
Attempted to confirm remote transmission with pt. No answer and was unable to leave a message.   

## 2015-12-14 ENCOUNTER — Encounter: Payer: Self-pay | Admitting: Cardiology

## 2015-12-24 ENCOUNTER — Ambulatory Visit (INDEPENDENT_AMBULATORY_CARE_PROVIDER_SITE_OTHER): Payer: Medicare Other

## 2015-12-24 ENCOUNTER — Telehealth: Payer: Self-pay

## 2015-12-24 DIAGNOSIS — Z95810 Presence of automatic (implantable) cardiac defibrillator: Secondary | ICD-10-CM | POA: Diagnosis not present

## 2015-12-24 DIAGNOSIS — I5022 Chronic systolic (congestive) heart failure: Secondary | ICD-10-CM

## 2015-12-24 NOTE — Progress Notes (Signed)
EPIC Encounter for ICM Monitoring  Patient Name: Paul [name]. is a 75 y.o. male Date: 12/24/2015 Primary Care Physican: Caralyn Guile, DO Primary Cardiologist: Domenic Polite Electrophysiologist: Allred Dry Weight:     unknown Bi-V Pacing:  100%           Heart Failure questions reviewed, pt reported he has a cold with congestion  Thoracic impedance abnormal suggesting fluid accumulation.  Recent Labwork: 07/25/2015 Creatinine 1.48, BUN 26, Potassium 4.1, Sodium 141, eGFR 46  Recommendations: Take Furosemide 40 mg 0.5 tablet (20 mg total) x 2 days and then return to prn.  Take Potassium 20 meq 1 tablet x 2 days on days when taking Lasix and then return to prn  Follow-up plan: ICM clinic phone appointment on 12/31/2015 to recheck fluid levels.  Copy of ICM check sent to primary cardiologist and device physician.   ICM trend: 12/24/2015       [name], RN 12/24/2015 2:13 PM

## 2015-12-24 NOTE — Progress Notes (Signed)
Agree with diuretic plan for now.

## 2015-12-24 NOTE — Telephone Encounter (Signed)
Remote ICM transmission received.  Attempted patient call and left message to return call.   

## 2015-12-31 ENCOUNTER — Ambulatory Visit (INDEPENDENT_AMBULATORY_CARE_PROVIDER_SITE_OTHER): Payer: Medicare Other

## 2015-12-31 DIAGNOSIS — I5022 Chronic systolic (congestive) heart failure: Secondary | ICD-10-CM | POA: Diagnosis not present

## 2015-12-31 DIAGNOSIS — Z95810 Presence of automatic (implantable) cardiac defibrillator: Secondary | ICD-10-CM

## 2015-12-31 NOTE — Progress Notes (Signed)
EPIC Encounter for ICM Monitoring  Patient Name: Paul [name]. is a 75 y.o. male Date: 12/31/2015 Primary Care Physican: Caralyn Guile DO imary Cardiologist:McDowell Electrophysiologist: Allred Dry Weight:unknown Bi-V Pacing: 99.9%                Attempted ICM call and unable to reach. Left message to return call.  Transmission reviewed.   Thoracic impedance returned to normal after taking prn Furosemide x 2 days on 12/24/2015.  Follow-up plan: ICM clinic phone appointment on 01/24/2016.  Copy of ICM check sent to primary cardiologist and device physician.   ICM trend: 12/31/2015       Paul Billings, RN 12/31/2015 2:24 PM

## 2016-01-24 ENCOUNTER — Ambulatory Visit (INDEPENDENT_AMBULATORY_CARE_PROVIDER_SITE_OTHER): Payer: Medicare Other

## 2016-01-24 DIAGNOSIS — Z95810 Presence of automatic (implantable) cardiac defibrillator: Secondary | ICD-10-CM | POA: Diagnosis not present

## 2016-01-24 DIAGNOSIS — I5022 Chronic systolic (congestive) heart failure: Secondary | ICD-10-CM | POA: Diagnosis not present

## 2016-01-24 NOTE — Progress Notes (Signed)
EPIC Encounter for ICM Monitoring  Patient Name: Paul [name]. is a 75 y.o. male Date: 01/24/2016 Primary Care Physican: Caralyn Guile, DO Primary Cardiologist:McDowell Electrophysiologist: Allred Dry Weight:unknown Bi-V Pacing: 99.9%     Heart Failure questions reviewed, pt asymptomatic   Thoracic impedance abnormal   Recommendations:  Take Furosemide 40 mg 1/2 tablet (20 mg total) x 2 days and then return to prn  Follow-up plan: ICM clinic phone appointment on 02/01/2016 to recheck fluid levels.  Copy of ICM check sent to primary cardiologist and device physician.   ICM trend: 01/24/2016       [name], RN 01/24/2016 5:47 PM

## 2016-01-29 NOTE — Progress Notes (Signed)
Message  Received: 3 days ago  Message Contents  Thompson Grayer, MD  [name], RN        Sounds good

## 2016-02-01 ENCOUNTER — Telehealth: Payer: Self-pay

## 2016-02-01 ENCOUNTER — Ambulatory Visit (INDEPENDENT_AMBULATORY_CARE_PROVIDER_SITE_OTHER): Payer: Medicare Other

## 2016-02-01 DIAGNOSIS — Z95810 Presence of automatic (implantable) cardiac defibrillator: Secondary | ICD-10-CM

## 2016-02-01 DIAGNOSIS — I5022 Chronic systolic (congestive) heart failure: Secondary | ICD-10-CM

## 2016-02-01 NOTE — Progress Notes (Signed)
EPIC Encounter for ICM Monitoring  Patient Name: Paul [name]. is a 75 y.o. male Date: 02/01/2016 Primary Care Physican: Caralyn Guile, DO Primary Cardiologist:McDowell Electrophysiologist: Allred Dry Weight:unknown Bi-V Pacing: 100%           Attempted ICM call and unable to reach.   Transmission reviewed.   Thoracic impedance normal after taking prn Furosemide  Labs: 07/25/2015 Creatinine 1.48, BUN 26, Potassium 4.1, Sodium 141, EGFR 46-56  Recommendations:  NONE- unable to reach and no answering machine    Follow-up plan: ICM clinic phone appointment on 02/28/2016.  Copy of ICM check sent to device physician.   3 month ICM trend : 02/01/2016   1 Year ICM trend:     [name], RN 02/01/2016 8:30 AM

## 2016-02-01 NOTE — Telephone Encounter (Signed)
Remote ICM transmission received.  Attempted patient call and no answer 

## 2016-02-01 NOTE — Progress Notes (Signed)
Patient received call back from patient.  He stated he is doing well and current weight: 162 lbs.  He denied any fluid symptoms.  Reviewed transmission.  Advised next ICM remote transmission 02/28/2016.

## 2016-02-28 ENCOUNTER — Ambulatory Visit (INDEPENDENT_AMBULATORY_CARE_PROVIDER_SITE_OTHER): Payer: Medicare Other

## 2016-02-28 DIAGNOSIS — Z95810 Presence of automatic (implantable) cardiac defibrillator: Secondary | ICD-10-CM

## 2016-02-28 DIAGNOSIS — I5022 Chronic systolic (congestive) heart failure: Secondary | ICD-10-CM | POA: Diagnosis not present

## 2016-02-29 ENCOUNTER — Telehealth: Payer: Self-pay

## 2016-02-29 NOTE — Telephone Encounter (Signed)
Remote ICM transmission received.  Attempted patient call and left message to return call.   

## 2016-02-29 NOTE — Progress Notes (Signed)
EPIC Encounter for ICM Monitoring  Patient Name: Paul [name]. is a 76 y.o. male Date: 02/29/2016 Primary Care Physican: Caralyn Guile, DO Primary Cardiologist:McDowell Electrophysiologist: Allred Dry Weight:unknown Bi-V Pacing: 100%      Attempted call to patient and unable to reach.  Left message to return call.  Transmission reviewed.   Thoracic impedance close to normal.  Labs: 07/25/2015 Creatinine 1.48, BUN 26, Potassium 4.1, Sodium 141, EGFR 46-56  Recommendations: NONE - Unable to reach patient   Follow-up plan: ICM clinic phone appointment on 03/31/2016.  Copy of ICM check sent to device physician.   3 month ICM trend: 02/29/2016   1 Year ICM trend:      [name], RN 02/29/2016 3:01 PM

## 2016-03-31 ENCOUNTER — Ambulatory Visit (INDEPENDENT_AMBULATORY_CARE_PROVIDER_SITE_OTHER): Payer: Medicare Other

## 2016-03-31 DIAGNOSIS — I5022 Chronic systolic (congestive) heart failure: Secondary | ICD-10-CM

## 2016-03-31 DIAGNOSIS — Z95810 Presence of automatic (implantable) cardiac defibrillator: Secondary | ICD-10-CM

## 2016-03-31 NOTE — Progress Notes (Signed)
EPIC Encounter for ICM Monitoring  Patient Name: Paul [name]. is a 76 y.o. male Date: 03/31/2016 Primary Care Physican: Caralyn Guile, DO Primary Cardiologist:McDowell Electrophysiologist: Allred Dry Weight:unknown Bi-V Pacing: 99.9%       Heart Failure questions reviewed, pt asymptomatic.   Thoracic impedance normal.  Prescribed and confirmed dosage: Furosemide 40 mg 1/2 tablet (20 mg total) as needed. Potassium 1 tablet (20 mEq total) by mouth daily as needed (on the days when taking Lasix).  Labs: 07/25/2015 Creatinine 1.48, BUN 26, Potassium 4.1, Sodium 141, EGFR 46-56  Recommendations: No changes. Reminded to limit dietary salt intake to 2000 mg/day and fluid intake to < 2 liters/day. Encouraged to call for fluid symptoms.  Follow-up plan: ICM clinic phone appointment on 05/01/2016.  Copy of ICM check sent to device physician.   3 month ICM trend: 03/31/2016   1 Year ICM trend:      [name], RN 03/31/2016 1:19 PM

## 2016-05-01 ENCOUNTER — Ambulatory Visit (INDEPENDENT_AMBULATORY_CARE_PROVIDER_SITE_OTHER): Payer: Medicare Other

## 2016-05-01 ENCOUNTER — Telehealth: Payer: Self-pay

## 2016-05-01 DIAGNOSIS — I5022 Chronic systolic (congestive) heart failure: Secondary | ICD-10-CM

## 2016-05-01 DIAGNOSIS — Z95810 Presence of automatic (implantable) cardiac defibrillator: Secondary | ICD-10-CM | POA: Diagnosis not present

## 2016-05-01 NOTE — Telephone Encounter (Signed)
Remote ICM transmission received.  Attempted patient call and left message to return call.   

## 2016-05-01 NOTE — Progress Notes (Signed)
EPIC Encounter for ICM Monitoring  Patient Name: Paul [name]. is a 76 y.o. male Date: 05/01/2016 Primary Care Physican: Caralyn Guile, DO Primary Cardiologist:McDowell Electrophysiologist: Allred Dry Weight:unknown Bi-V Pacing: 99.9%         Attempted call to patient and unable to reach.  Left message to return call.  Transmission reviewed.    Thoracic impedance normal.  Prescribed and confirmed dosage: Furosemide 40 mg 1/2 tablet (20 mg total) as needed. Potassium 1 tablet (20 mEq total) by mouth daily as needed (on the days when taking Lasix).  Labs: 07/25/2015 Creatinine 1.48, BUN 26, Potassium 4.1, Sodium 141, EGFR 46-56  Recommendations: NONE - Unable to reach patient   Follow-up plan: ICM clinic phone appointment on 06/03/2016.  Copy of ICM check sent to device physician.   3 month ICM trend: 05/01/2016   1 Year ICM trend:      [name], RN 05/01/2016 5:14 PM

## 2016-05-02 NOTE — Progress Notes (Signed)
Received return call and patient stated he is feeling fine.  He is at the [place] today getting annual physical.  Denied any fluid symptoms.  No changes today and encouraged to call for fluid symptoms.  Next ICM remote transmission 06/03/2016

## 2016-05-19 ENCOUNTER — Ambulatory Visit (INDEPENDENT_AMBULATORY_CARE_PROVIDER_SITE_OTHER): Payer: Medicare Other | Admitting: Cardiology

## 2016-05-19 ENCOUNTER — Encounter: Payer: Self-pay | Admitting: Cardiology

## 2016-05-19 VITALS — BP 129/83 | HR 64 | Ht 67.0 in | Wt 174.8 lb

## 2016-05-19 DIAGNOSIS — I2510 Atherosclerotic heart disease of native coronary artery without angina pectoris: Secondary | ICD-10-CM

## 2016-05-19 DIAGNOSIS — I10 Essential (primary) hypertension: Secondary | ICD-10-CM

## 2016-05-19 DIAGNOSIS — Z95810 Presence of automatic (implantable) cardiac defibrillator: Secondary | ICD-10-CM | POA: Diagnosis not present

## 2016-05-19 DIAGNOSIS — Z8679 Personal history of other diseases of the circulatory system: Secondary | ICD-10-CM | POA: Diagnosis not present

## 2016-05-19 NOTE — Patient Instructions (Signed)
Medication Instructions:  Your physician recommends that you continue on your current medications as directed. Please refer to the Current Medication list given to you today.  Labwork: NONE  Testing/Procedures: NONE  Follow-Up: Your physician wants you to follow-up in: [place]. You will receive a reminder letter in the mail two months in advance. If you don't receive a letter, please call our office to schedule the follow-up appointment.  Any Other Special Instructions Will Be Listed Below (If Applicable).  If you need a refill on your cardiac medications before your next appointment, please call your pharmacy.  [place]!

## 2016-05-19 NOTE — Progress Notes (Signed)
Cardiology Office Note  Date: 05/19/2016   ID: [name]., DOB 09-Mar-1940, MRN [id number]  PCP: Caralyn Guile, DO  Primary Cardiologist: Rozann Lesches, MD   Chief Complaint  Patient presents with  . Cardiomyopathy    History of Present Illness: Albaro Deviney. is a 76 y.o. male that I met in April 2017. He presents for a routine follow-up visit. Interval follow-up is through the [facility]. From a cardiac perspective, he does not report any angina symptoms, no device discharges or syncope. He reports compliance with his medications. He only rarely has to take Lasix, maybe once or twice in a few month period.  He continues to follow in the device clinic with Dr. 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 Most recent thoracic impedance was normal range.  Current cardiac regimen includes as needed Lasix with potassium supplements, Lipitor, and Coreg.  Past Medical History:  Diagnosis Date  . Alcohol abuse    In the past. Stable over many years  . BPH (benign prostatic hyperplasia)   . CAD (coronary artery disease)    Distal circumflex and OM disease with collaterals 2008  . Cardiomyopathy    LVEF 25% up to 65%  . COPD (chronic obstructive pulmonary disease) ([place])   . Dyslipidemia   . Essential hypertension   . Gout   . ICD (implantable cardiac defibrillator) battery depletion    CRT-D device  . LBBB (left bundle branch block)   . Lung nodule    [facility]  . Migraines   . Mitral regurgitation    Severe before CRT with annular dilatation  . Smoking    Chantix cause headaches  . Syncope    Probably orthostasis    Past Surgical History:  Procedure Laterality Date  . BI-VENTRICULAR IMPLANTABLE CARDIOVERTER DEFIBRILLATOR N/A 12/30/2011   Procedure: BI-VENTRICULAR IMPLANTABLE CARDIOVERTER DEFIBRILLATOR  (CRT-D);  Surgeon: Thompson Grayer, MD;  Location: [facility] CATH LAB;  Service: Cardiovascular;  Laterality: N/A;  . CARDIAC DEFIBRILLATOR PLACEMENT   12/29/12   BiV ICD implanted, generator change 12/30/11 by Dr Rayann Heman MDT Protecta XT CRT-D  . ORIF ANKLE FRACTURE Right 08/31/2014   Procedure: OPEN REDUCTION INTERNAL FIXATION (ORIF) ANKLE FRACTURE;  Surgeon: Marybelle Killings, MD;  Location: [place];  Service: Orthopedics;  Laterality: Right;    Current Outpatient Prescriptions  Medication Sig Dispense Refill  . albuterol (PROVENTIL HFA;VENTOLIN HFA) 108 (90 Base) MCG/ACT inhaler Inhale 2 puffs into the lungs every 4 (four) hours as needed for wheezing or shortness of breath.    . Albuterol Sulfate 108 (90 Base) MCG/ACT AEPB Inhale into the lungs.    Marland Kitchen atorvastatin (LIPITOR) 80 MG tablet Take 80 mg by mouth daily.    . carbamide peroxide (DEBROX) 6.5 % otic solution Place 5 drops into both ears daily as needed (for ear vwax removal).    . carvedilol (COREG) 25 MG tablet Take 12.5 mg by mouth 2 (two) times daily.    . cetirizine (ZYRTEC) 10 MG tablet Take 10 mg by mouth daily.    . Cholecalciferol (VITAMIN D3) 2000 units TABS Take 2 tablets by mouth daily.    . diclofenac sodium (VOLTAREN) 1 % GEL Apply 2 g topically as needed.    . divalproex (DEPAKOTE) 500 MG 24 hr tablet Take 1,500 mg by mouth at bedtime.      . finasteride (PROSCAR) 5 MG tablet Take 5 mg by mouth daily.      . folic acid (FOLVITE)  1 MG tablet Take 1 mg by mouth daily.      . furosemide (LASIX) 40 MG tablet Take 0.5 tablets (20 mg total) by mouth daily as needed for fluid or edema (weight gain > 3lbs). 30 tablet   . gabapentin (NEURONTIN) 300 MG capsule Take 300-600 mg by mouth at bedtime.     . hydroxypropyl methylcellulose (ISOPTO TEARS) 2.5 % ophthalmic solution Place 1 drop into both eyes 4 (four) times daily as needed for dry eyes.     Marland Kitchen loratadine (CLARITIN) 10 MG tablet Take 10 mg by mouth daily.    . Multiple Vitamin (MULTIVITAMIN) tablet Take 1 tablet by mouth daily.      . nitroGLYCERIN (NITROSTAT) 0.4 MG SL tablet Place 0.4 mg under the tongue every 5 (five) minutes as  needed.      . Omega-3 Fatty Acids (FISH OIL) 1000 MG CAPS Take 1 capsule by mouth daily.    Marland Kitchen omeprazole (PRILOSEC) 20 MG capsule Take 20 mg by mouth daily.      . potassium chloride SA (K-DUR,KLOR-CON) 20 MEQ tablet Take 1 tablet (20 mEq total) by mouth daily as needed (In the days when you take Lasix).    Marland Kitchen tiotropium (SPIRIVA) 18 MCG inhalation capsule Place 18 mcg into inhaler and inhale every morning.       No current facility-administered medications for this visit.    Allergies:  Codeine; Morphine sulfate; and Mupirocin   Social History: The patient  reports that he has been smoking Cigarettes and E-cigarettes.  He started smoking about 60 years ago. He has a 22.50 pack-year smoking history. He has never used smokeless tobacco. He reports that he drinks alcohol. He reports that he does not use drugs.   ROS:  Please see the history of present illness. Otherwise, complete review of systems is positive for hearing loss, chronic arthritic pain.  All other systems are reviewed and negative.   Physical Exam: VS:  BP 129/83   Pulse 64   Ht _0  (1.702 m)   Wt 174 lb 12.8 oz (79.3 kg)   SpO2 100%   BMI 27.38 kg/m , BMI Body mass index is 27.38 kg/m.  Wt Readings from Last 3 Encounters:  05/19/16 174 lb 12.8 oz (79.3 kg)  09/07/15 172 lb 9.6 oz (78.3 kg)  05/30/15 181 lb (82.1 kg)    General: Elderly male, appears comfortable at rest. HEENT: Conjunctiva and lids normal, oropharynx clear. Neck: Supple, no elevated JVP or carotid bruits, no thyromegaly. Lungs: Decreased breath sounds without wheezing, nonlabored breathing at rest. Cardiac: Distant, regular rate and rhythm, no S3 or significant systolic murmur, no pericardial rub. Abdomen: Soft, nontender, bowel sounds present, no guarding or rebound. Extremities: Trace left ankle edema, distal pulses 2+. Skin: Warm and dry. Musculoskeletal: No kyphosis. Neuropsychiatric: Alert and oriented x3, affect grossly appropriate.  ECG: I  personally reviewed the tracing from 09/07/2015 which showed a ventricular paced rhythm.  Recent Labwork:  June 2017: BUN 26, creatinine 1.48, potassium 4.1  Other Studies Reviewed Today:  Echocardiogram 09/02/2014: Study Conclusions  - Left ventricle: The cavity size was normal. Wall thickness was   normal. Systolic function was vigorous. The estimated ejection   fraction was in the range of 65% to 70%. Doppler parameters are   consistent with abnormal left ventricular relaxation (grade 1   diastolic dysfunction). - Ventricular septum: Septal motion showed abnormal function and   dyssynergy. These changes are consistent with intraventricular   conduction delay. -  Right ventricle: Pacer wire or catheter noted in right ventricle. - Right atrium: Pacer wire or catheter noted in right atrium.  Assessment and Plan:  1. 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 Plan to continue current medications, low-dose Lasix as needed.  2. 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 He continues to follow in the device clinic with Dr. Rayann Heman. Recent thoracic impedance was normal.  3. Essential hypertension, blood pressure is well controlled today. No changes were made.  4. Symptomatically stable CAD, distal circumflex and obtuse marginal disease with collaterals, managed medically. Continue statin therapy. Lipids are followed by the [facility] medical system.  Current medicines were reviewed with the patient today.  Disposition: Follow-up in one year, sooner if needed.  Signed, Satira Sark, MD, [facility] 05/19/2016 1:09 PM    [facility] [address], [place], [place] [address] Phone: [phone]; Fax: [phone]

## 2016-06-02 ENCOUNTER — Ambulatory Visit: Payer: Medicare Other | Admitting: Cardiology

## 2016-06-03 ENCOUNTER — Ambulatory Visit (INDEPENDENT_AMBULATORY_CARE_PROVIDER_SITE_OTHER): Payer: Medicare Other

## 2016-06-03 DIAGNOSIS — I5022 Chronic systolic (congestive) heart failure: Secondary | ICD-10-CM

## 2016-06-03 DIAGNOSIS — Z95810 Presence of automatic (implantable) cardiac defibrillator: Secondary | ICD-10-CM | POA: Diagnosis not present

## 2016-06-05 NOTE — Progress Notes (Signed)
EPIC Encounter for ICM Monitoring  Patient Name: Paul [name]. is a 76 y.o. male Date: 06/05/2016 Primary Care Physican: PIVA,ENRICO E, DO Primary Cardiologist:McDowell Electrophysiologist: Allred Dry Weight:unknown Bi-V Pacing: 99.9%      Heart Failure questions reviewed, pt asymptomatic but did feel like he needed to take prn Furosemide 06/04/2016.Marland Kitchen   Thoracic impedance abnormal suggesting fluid accumulation since 05/01/2016 but almost at baseline 06/03/16.  Prescribed and confirmed dosage: [facility] reported [facility] 20 mg 1 tablet (20 mg total) as needed. Potassium 1 tablet (20 mEq total) by mouth daily as needed (onthe days when takingLasix).  Labs: 06/05/2016 Most recent labs at [facility] 07/25/2015 Creatinine 1.48, BUN 26, Potassium 4.1, Sodium 141, EGFR 46-56  Recommendations:  Patient took prn Furosemide yesterday and today.   Encouraged to call for fluid symptoms or use local emergency room for any urgent symptoms.  Follow-up plan: ICM clinic phone appointment on 07/04/2016.  Office appointment scheduled on 09/03/2016 with Dr Rayann Heman.  Copy of ICM check sent to device physician.   3 month ICM trend: 06/03/2016   1 Year ICM trend:      [name], RN 06/05/2016 10:08 AM

## 2016-06-24 ENCOUNTER — Telehealth: Payer: Self-pay

## 2016-06-24 NOTE — Telephone Encounter (Signed)
New Message:    Please call,pt said he had talked to you eralier today.

## 2016-06-24 NOTE — Telephone Encounter (Signed)
Returned patient call.  He said he wanted to let me know he would be at the ICD support group meeting tonight.

## 2016-07-04 ENCOUNTER — Telehealth: Payer: Self-pay

## 2016-07-04 ENCOUNTER — Ambulatory Visit (INDEPENDENT_AMBULATORY_CARE_PROVIDER_SITE_OTHER): Payer: Medicare Other

## 2016-07-04 DIAGNOSIS — I5022 Chronic systolic (congestive) heart failure: Secondary | ICD-10-CM

## 2016-07-04 DIAGNOSIS — Z95810 Presence of automatic (implantable) cardiac defibrillator: Secondary | ICD-10-CM

## 2016-07-04 NOTE — Telephone Encounter (Signed)
Remote ICM transmission received.  Attempted patient call and left message to return call.   

## 2016-07-04 NOTE — Progress Notes (Signed)
EPIC Encounter for ICM Monitoring  Patient Name: Paul [name]. is a 76 y.o. male Date: 07/04/2016 Primary Care Physican: Caralyn Guile, DO Primary Cardiologist:McDowell Electrophysiologist: Allred Dry Weight:unknown Bi-V Pacing: 100%                                               Attempted call to patient and unable to reach.  Left message to return call.  Transmission reviewed.    Thoracic impedance normal.  Prescribed dosage: Patient reported [facility] Furosemide 20 mg 1 tablet (20 mg total) as needed. Potassium 1 tablet (20 mEq total) by mouth daily as needed (onthe days when takingLasix).  Labs: 06/05/2016 Most recent labs at [facility] 07/25/2015 Creatinine 1.48, BUN 26, Potassium 4.1, Sodium 141, EGFR 46-56  Recommendations: NONE - Unable to reach patient   Follow-up plan: ICM clinic phone appointment on 08/04/2016.  Office appointment scheduled on 09/05/2016 with Dr Rayann Heman.  Copy of ICM check sent to device physician.   3 month ICM trend: 07/04/2016   1 Year ICM trend:      [name], RN 07/04/2016 7:48 AM

## 2016-08-04 ENCOUNTER — Telehealth: Payer: Self-pay | Admitting: Cardiology

## 2016-08-04 ENCOUNTER — Ambulatory Visit (INDEPENDENT_AMBULATORY_CARE_PROVIDER_SITE_OTHER): Payer: Medicare Other

## 2016-08-04 DIAGNOSIS — Z95810 Presence of automatic (implantable) cardiac defibrillator: Secondary | ICD-10-CM

## 2016-08-04 DIAGNOSIS — I5022 Chronic systolic (congestive) heart failure: Secondary | ICD-10-CM | POA: Diagnosis not present

## 2016-08-04 NOTE — Telephone Encounter (Signed)
LMOVM reminding pt to send remote transmission.   

## 2016-08-05 NOTE — Progress Notes (Addendum)
EPIC Encounter for ICM Monitoring  Patient Name: Paul [name]. is a 76 y.o. male Date: 08/05/2016 Primary Care Physican: Caralyn Guile, DO Primary Cardiologist:McDowell Electrophysiologist: Allred Dry Weight:174 lbs Bi-V Pacing: 99.9%      Heart Failure questions reviewed, pt asymptomatic today but did take PRN Furosemide on 08/04/2016.   Thoracic impedance normal today but has been abnormal suggesting fluid accumulation in the last 2 weeks.  Prescribed dosage: Patient reported [facility] Furosemide 20 mg 1 tablet (20 mg total) as needed. Potassium 1 tablet (20 mEq total) by mouth daily as needed (onthe days when takingLasix).  Labs: 06/05/2016 Most recent labs at [facility] 07/25/2015 Creatinine 1.48, BUN 26, Potassium 4.1, Sodium 141, EGFR 46-56  Recommendations: No changes.  Advised to limit salt intake to 2000 mg/day and fluid intake to < 2 liters/day.  Encouraged to call for fluid symptoms.  Follow-up plan: ICM clinic phone appointment on 10/07/2016.  Office appointment scheduled 09/05/2016 with Dr. Rayann Heman.  Copy of ICM check sent to device physician.   3 month ICM trend: 08/05/2016   1 Year ICM trend:      [name], RN 08/05/2016 10:20 AM

## 2016-08-13 IMAGING — RF DG ANKLE 2V *R*
1 series · 2 of 2 positions shown · non-contrast
Comparison: Radiographs dated 08/30/2014

CLINICAL DATA: Right ankle fracture.

EXAM:
DG C-ARM 61-120 MIN; RIGHT ANKLE - 2 VIEW

[Series 1: run · 2 of 2 slices shown]
[im 1/2]
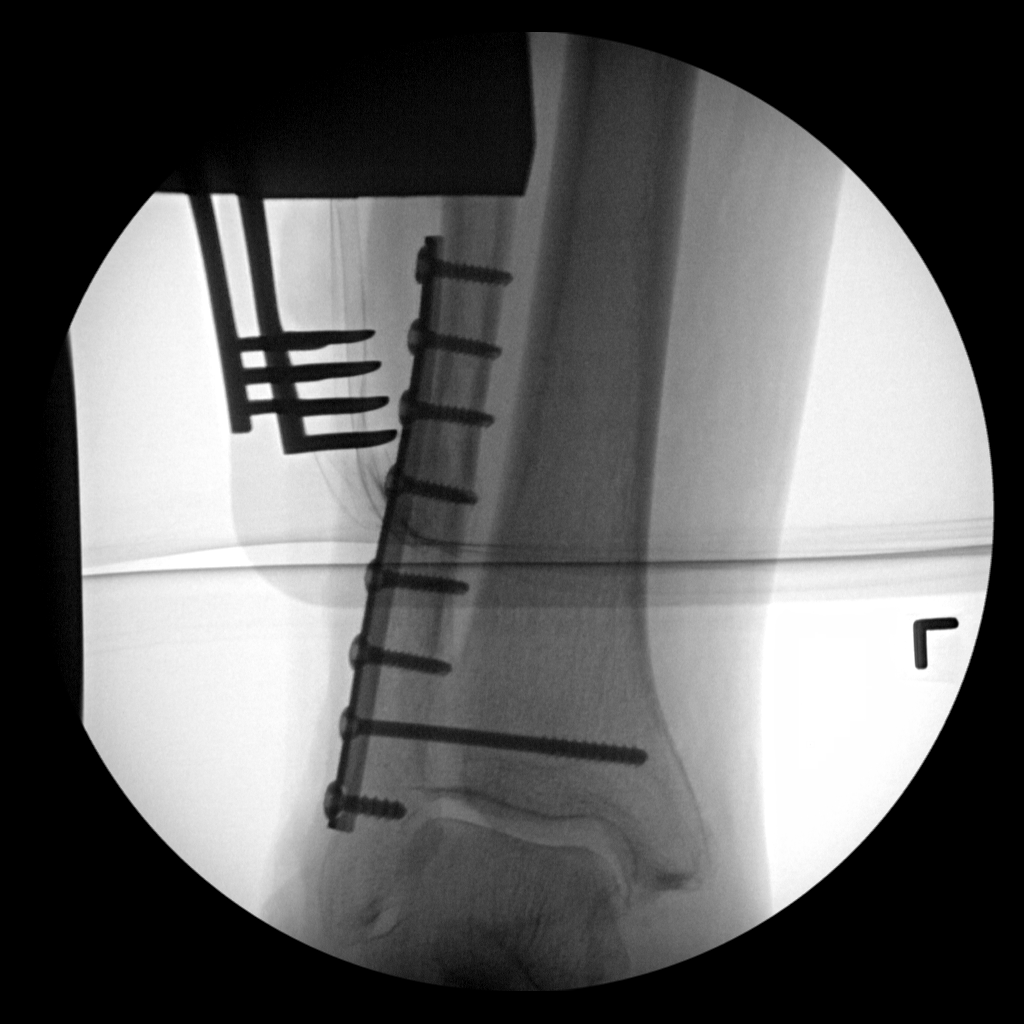
[im 2/2]
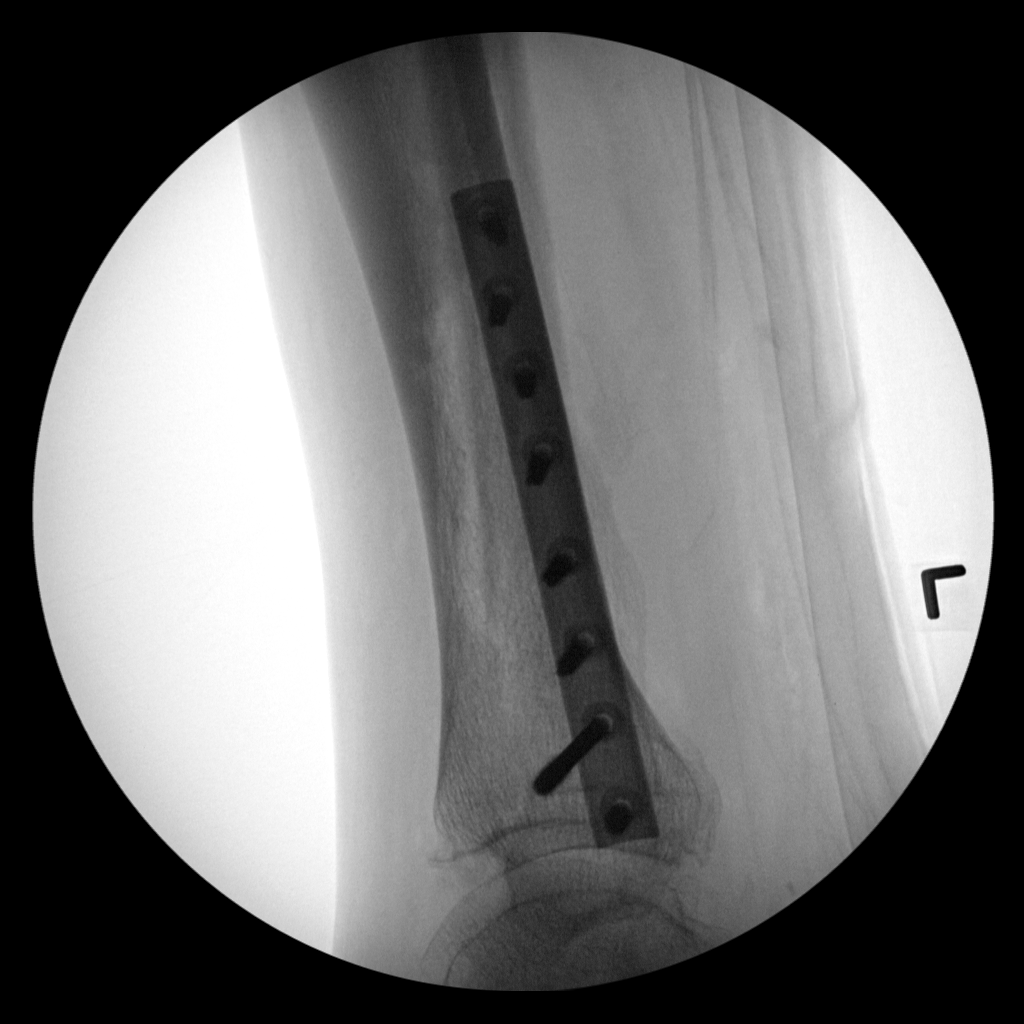

[2 of 2 positions shown; findings below may reference images not displayed]

FINDINGS: C-arm images demonstrate that the patient has undergone open
reduction and internal fixation of the distal fibula fracture.
Disruption of the distal tibiofibular syndesmosis has been corrected
with a screw across the tibia and fibula. Ankle mortise has been
restored.
IMPRESSION: Open reduction and internal fixation of the fibula fracture.

## 2016-08-21 ENCOUNTER — Telehealth: Payer: Self-pay

## 2016-08-21 NOTE — Telephone Encounter (Signed)
Returned call to patient as requested by voice mail message.  Patient advised of date and time of appointment with Dr Rayann Heman as requested.

## 2016-09-05 ENCOUNTER — Encounter: Payer: Self-pay | Admitting: Internal Medicine

## 2016-09-05 ENCOUNTER — Ambulatory Visit (INDEPENDENT_AMBULATORY_CARE_PROVIDER_SITE_OTHER): Payer: Medicare Other | Admitting: Internal Medicine

## 2016-09-05 VITALS — BP 100/62 | HR 69 | Ht 67.0 in | Wt 171.8 lb

## 2016-09-05 DIAGNOSIS — I447 Left bundle-branch block, unspecified: Secondary | ICD-10-CM

## 2016-09-05 DIAGNOSIS — I5022 Chronic systolic (congestive) heart failure: Secondary | ICD-10-CM

## 2016-09-05 DIAGNOSIS — I2510 Atherosclerotic heart disease of native coronary artery without angina pectoris: Secondary | ICD-10-CM

## 2016-09-05 LAB — CUP PACEART INCLINIC DEVICE CHECK
Battery Voltage: 2.9 V
Brady Statistic AP VP Percent: 0.1 %
Brady Statistic AP VS Percent: 0.01 %
Brady Statistic AS VP Percent: 99.85 %
Brady Statistic RA Percent Paced: 0.11 %
Brady Statistic RV Percent Paced: 99.94 %
HIGH POWER IMPEDANCE MEASURED VALUE: 456 Ohm
HIGH POWER IMPEDANCE MEASURED VALUE: 54 Ohm
HighPow Impedance: 68 Ohm
Implantable Lead Implant Date: 20081014
Implantable Lead Location: 753860
Implantable Lead Model: 4542
Implantable Lead Model: 5076
Implantable Lead Model: 6947
Lead Channel Impedance Value: 627 Ohm
Lead Channel Impedance Value: 969 Ohm
Lead Channel Pacing Threshold Amplitude: 1.125 V
Lead Channel Pacing Threshold Pulse Width: 0.4 ms
Lead Channel Sensing Intrinsic Amplitude: 18.375 mV
Lead Channel Sensing Intrinsic Amplitude: 18.375 mV
Lead Channel Sensing Intrinsic Amplitude: 2.25 mV
Lead Channel Setting Pacing Amplitude: 2 V
Lead Channel Setting Pacing Amplitude: 2.5 V
Lead Channel Setting Pacing Pulse Width: 0.4 ms
MDC IDC LEAD IMPLANT DT: 20081014
MDC IDC LEAD IMPLANT DT: 20081014
MDC IDC LEAD LOCATION: 753858
MDC IDC LEAD LOCATION: 753859
MDC IDC LEAD SERIAL: 128499
MDC IDC MSMT LEADCHNL LV IMPEDANCE VALUE: 570 Ohm
MDC IDC MSMT LEADCHNL LV PACING THRESHOLD AMPLITUDE: 0.625 V
MDC IDC MSMT LEADCHNL LV PACING THRESHOLD PULSEWIDTH: 0.4 ms
MDC IDC MSMT LEADCHNL RA IMPEDANCE VALUE: 437 Ohm
MDC IDC MSMT LEADCHNL RA PACING THRESHOLD AMPLITUDE: 0.75 V
MDC IDC MSMT LEADCHNL RA PACING THRESHOLD PULSEWIDTH: 0.4 ms
MDC IDC MSMT LEADCHNL RA SENSING INTR AMPL: 2.25 mV
MDC IDC MSMT LEADCHNL RV IMPEDANCE VALUE: 570 Ohm
MDC IDC PG IMPLANT DT: 20131126
MDC IDC SESS DTM: 20180803122600
MDC IDC SET LEADCHNL RA PACING AMPLITUDE: 2 V
MDC IDC SET LEADCHNL RV PACING PULSEWIDTH: 0.4 ms
MDC IDC SET LEADCHNL RV SENSING SENSITIVITY: 0.3 mV
MDC IDC STAT BRADY AS VS PERCENT: 0.04 %

## 2016-09-05 NOTE — Patient Instructions (Signed)
Medication Instructions:  Continue all current medications.  Labwork: none  Testing/Procedures: none  Follow-Up: Your physician wants you to follow up in:  1 year.  You will receive a reminder letter in the mail one-two months in advance.  If you don't receive a letter, please call our office to schedule the follow up appointment.  (DR. ALLRED)  Any Other Special Instructions Will Be Listed Below (If Applicable). Remote monitoring is used to monitor your Pacemaker of ICD from home. This monitoring reduces the number of office visits required to check your device to one time per year. It allows [place] to keep an eye on the functioning of your device to ensure it is working properly. You are scheduled for a device check from home on 12-08-2016. You may send your transmission at any time that day. If you have a wireless device, the transmission will be sent automatically. After your physician reviews your transmission, you will receive a postcard with your next transmission date.  If you need a refill on your cardiac medications before your next appointment, please call your pharmacy.

## 2016-09-05 NOTE — Progress Notes (Signed)
PCP: Caralyn Guile, DO Primary Cardiologist:  Dr Domenic Polite Primary EP: Dr Rayann Heman  [name]. is a 76 y.o. male who presents today for routine electrophysiology followup.  Since last being seen in our clinic, the patient reports doing very well.  Today, he denies symptoms of palpitations, chest pain, shortness of breath,  lower extremity edema, dizziness, presyncope, syncope, or ICD shocks.  The patient is otherwise without complaint today.   Past Medical History:  Diagnosis Date  . Alcohol abuse    In the past. Stable over many years  . BPH (benign prostatic hyperplasia)   . CAD (coronary artery disease)    Distal circumflex and OM disease with collaterals 2008  . Cardiomyopathy    LVEF 25% up to 65%  . COPD (chronic obstructive pulmonary disease) ([place])   . Dyslipidemia   . Essential hypertension   . Gout   . ICD (implantable cardiac defibrillator) battery depletion    CRT-D device  . LBBB (left bundle branch block)   . Lung nodule    [facility]  . Migraines   . Mitral regurgitation    Severe before CRT with annular dilatation  . Smoking    Chantix cause headaches  . Syncope    Probably orthostasis   Past Surgical History:  Procedure Laterality Date  . BI-VENTRICULAR IMPLANTABLE CARDIOVERTER DEFIBRILLATOR N/A 12/30/2011   Procedure: BI-VENTRICULAR IMPLANTABLE CARDIOVERTER DEFIBRILLATOR  (CRT-D);  Surgeon: Thompson Grayer, MD;  Location: [facility] CATH LAB;  Service: Cardiovascular;  Laterality: N/A;  . CARDIAC DEFIBRILLATOR PLACEMENT  12/29/12   BiV ICD implanted, generator change 12/30/11 by Dr Rayann Heman MDT Protecta XT CRT-D  . ORIF ANKLE FRACTURE Right 08/31/2014   Procedure: OPEN REDUCTION INTERNAL FIXATION (ORIF) ANKLE FRACTURE;  Surgeon: Marybelle Killings, MD;  Location: [facility];  Service: Orthopedics;  Laterality: Right;    ROS- all systems are reviewed and negative except as per HPI above  Current Outpatient Prescriptions  Medication Sig Dispense Refill  .  albuterol (PROVENTIL HFA;VENTOLIN HFA) 108 (90 Base) MCG/ACT inhaler Inhale 2 puffs into the lungs every 4 (four) hours as needed for wheezing or shortness of breath.    . Albuterol Sulfate 108 (90 Base) MCG/ACT AEPB Inhale into the lungs.    Marland Kitchen atorvastatin (LIPITOR) 80 MG tablet Take 80 mg by mouth daily.    . carbamide peroxide (DEBROX) 6.5 % otic solution Place 5 drops into both ears daily as needed (for ear vwax removal).    . carvedilol (COREG) 25 MG tablet Take 12.5 mg by mouth 2 (two) times daily.    . cetirizine (ZYRTEC) 10 MG tablet Take 10 mg by mouth daily.    . Cholecalciferol (VITAMIN D3) 2000 units TABS Take 2 tablets by mouth daily.    . diclofenac sodium (VOLTAREN) 1 % GEL Apply 2 g topically as needed.    . divalproex (DEPAKOTE) 500 MG 24 hr tablet Take 1,500 mg by mouth at bedtime.      . finasteride (PROSCAR) 5 MG tablet Take 5 mg by mouth daily.      . folic acid (FOLVITE) 1 MG tablet Take 1 mg by mouth daily.      . furosemide (LASIX) 40 MG tablet Take 0.5 tablets (20 mg total) by mouth daily as needed for fluid or edema (weight gain > 3lbs). (Patient taking differently: Take 20 mg by mouth daily as needed for fluid or edema (weight gain > 3lbs). 06/05/16 [facility] 20  mg take 1 tablet by mouth as needed) 30 tablet   . gabapentin (NEURONTIN) 300 MG capsule Take 300-600 mg by mouth at bedtime.     . hydroxypropyl methylcellulose (ISOPTO TEARS) 2.5 % ophthalmic solution Place 1 drop into both eyes 4 (four) times daily as needed for dry eyes.     Marland Kitchen loratadine (CLARITIN) 10 MG tablet Take 10 mg by mouth daily.    . Multiple Vitamin (MULTIVITAMIN) tablet Take 1 tablet by mouth daily.      . nitroGLYCERIN (NITROSTAT) 0.4 MG SL tablet Place 0.4 mg under the tongue every 5 (five) minutes as needed.      . Omega-3 Fatty Acids (FISH OIL) 1000 MG CAPS Take 1 capsule by mouth daily.    Marland Kitchen omeprazole (PRILOSEC) 20 MG capsule Take 20 mg by mouth daily.      .  potassium chloride SA (K-DUR,KLOR-CON) 20 MEQ tablet Take 1 tablet (20 mEq total) by mouth daily as needed (In the days when you take Lasix).    Marland Kitchen tiotropium (SPIRIVA) 18 MCG inhalation capsule Place 18 mcg into inhaler and inhale every morning.       No current facility-administered medications for this visit.     Physical Exam: Vitals:   09/05/16 1111  BP: 100/62  Pulse: 69  SpO2: 98%  Weight: 171 lb 12.8 oz (77.9 kg)  Height: 5\' 7"  (1.702 m)    GEN- The patient is well appearing, alert and oriented x 3 today.   Head- normocephalic, atraumatic Eyes-  Sclera clear, conjunctiva pink Ears- hearing intact Oropharynx- clear Lungs- Clear to ausculation bilaterally, normal work of breathing Chest- ICD pocket is well healed Heart- Regular rate and rhythm, no murmurs, rubs or gallops, PMI not laterally displaced GI- soft, NT, ND, + BS Extremities- no clubbing, cyanosis, or edema  ICD interrogation- reviewed in detail today,  See PACEART report   Assessment and Plan:  1.  Chronic systolic dysfunction/ LBBB euvolemic today Stable on an appropriate medical regimen Normal ICD function See Pace Art report No changes today  2. Tobacco Cessation again encouraged today  carelink every 3 months Return to see me in a year  Followed by laurie short in the [facility] clinic  Return to see Dr Domenic Polite as scheduled  Thompson Grayer MD, [facility] 09/05/2016 11:33 AM

## 2016-10-07 ENCOUNTER — Telehealth: Payer: Self-pay

## 2016-10-07 ENCOUNTER — Ambulatory Visit (INDEPENDENT_AMBULATORY_CARE_PROVIDER_SITE_OTHER): Payer: Medicare Other

## 2016-10-07 DIAGNOSIS — I5022 Chronic systolic (congestive) heart failure: Secondary | ICD-10-CM | POA: Diagnosis not present

## 2016-10-07 DIAGNOSIS — Z95810 Presence of automatic (implantable) cardiac defibrillator: Secondary | ICD-10-CM | POA: Diagnosis not present

## 2016-10-07 NOTE — Telephone Encounter (Signed)
Remote ICM transmission received.  Attempted call to patient and left message to return call. 

## 2016-10-07 NOTE — Progress Notes (Signed)
EPIC Encounter for ICM Monitoring  Patient Name: Paul [name]. is a 76 y.o. male Date: 10/07/2016 Primary Care Physican: Caralyn Guile, DO Primary Cardiologist:[place] Electrophysiologist: Allred Dry Weight:Previous ICM weight 174 lbs Bi-V Pacing: 99.9%      Attempted call to patient and unable to reach.  Left message to return call detailed message regarding transmission.  Transmission reviewed.    Thoracic impedance normal but was abnormal suggesting fluid accumulation from 8/21 to 8/30.  Prescribed dosage: Furosemide 20 mg 1 tablet (20 mg total) as needed. Potassium 1 tablet (20 mEq total) by mouth daily as needed (onthe days when takingLasix).  Labs: 06/05/2016 Most recent labs at [facility] 07/25/2015 Creatinine 1.48, BUN 26, Potassium 4.1, Sodium 141, EGFR 46-56  Recommendations: NONE - Unable to reach patient   Follow-up plan: ICM clinic phone appointment on 11/07/2016.    Copy of ICM check sent to Dr. Rayann Heman.  3 month ICM trend: 10/07/2016   1 Year ICM trend:      [name], RN 10/07/2016 2:12 PM

## 2016-10-13 NOTE — Progress Notes (Signed)
Patient returned call.  He reported he is feeling fine and denied any fluid symptoms.  Transmission reviewed.  No changes today.  Next ICM remote transmission 11/07/2016.  Patient gave permission to leave detailed message on [phone].

## 2016-11-07 ENCOUNTER — Telehealth: Payer: Self-pay

## 2016-11-07 ENCOUNTER — Ambulatory Visit (INDEPENDENT_AMBULATORY_CARE_PROVIDER_SITE_OTHER): Payer: Medicare Other

## 2016-11-07 DIAGNOSIS — I5022 Chronic systolic (congestive) heart failure: Secondary | ICD-10-CM

## 2016-11-07 DIAGNOSIS — Z95810 Presence of automatic (implantable) cardiac defibrillator: Secondary | ICD-10-CM

## 2016-11-07 NOTE — Telephone Encounter (Signed)
Remote ICM transmission received.  Attempted call to patient and no answer or answering machine

## 2016-11-07 NOTE — Progress Notes (Signed)
EPIC Encounter for ICM Monitoring  Patient Name: Paul [name]. is a 76 y.o. male Date: 11/07/2016 Primary Care Physican: Caralyn Guile, DO Primary [place] Electrophysiologist: Allred Dry Weight:Previous ICM weight 174 lbs Bi-V Pacing: 100%       Attempted call to patient and unable to reach.  Transmission reviewed.    Thoracic impedance normal.  Prescribed dosage: Furosemide 20 mg 1 tablet (20 mg total) as needed. Potassium 1 tablet (20 mEq total) by mouth daily as needed (onthe days when takingLasix).  Labs: 06/05/2016 Most recent labs at [facility] 07/25/2015 Creatinine 1.48, BUN 26, Potassium 4.1, Sodium 141, EGFR 46-56  Recommendations: Left voice mail with ICM number and encouraged to call if experiencing any fluid symptoms.  Follow-up plan: ICM clinic phone appointment on 12/08/2016.    Copy of ICM check sent to Dr. Rayann Heman.   3 month ICM trend: 11/07/2016   1 Year ICM trend:      Paul Billings, RN 11/07/2016 8:15 AM

## 2016-11-13 ENCOUNTER — Telehealth: Payer: Self-pay

## 2016-11-13 NOTE — Telephone Encounter (Signed)
Error - duplicate

## 2016-11-13 NOTE — Telephone Encounter (Signed)
Returned patient call as requested by voice mail message asking when is next ICM remote transmission scheduled.  Attempted call and left message the transmission is scheduled for 12/08/2016.

## 2016-11-20 ENCOUNTER — Telehealth: Payer: Self-pay

## 2016-11-20 NOTE — Telephone Encounter (Signed)
Returned patient's call as requested asking for next remote transmission date.  Advised next transmission is 12/08/2016.

## 2016-12-08 ENCOUNTER — Ambulatory Visit (INDEPENDENT_AMBULATORY_CARE_PROVIDER_SITE_OTHER): Payer: Medicare Other | Admitting: *Deleted

## 2016-12-08 DIAGNOSIS — Z95810 Presence of automatic (implantable) cardiac defibrillator: Secondary | ICD-10-CM

## 2016-12-08 DIAGNOSIS — I5022 Chronic systolic (congestive) heart failure: Secondary | ICD-10-CM

## 2016-12-08 DIAGNOSIS — I429 Cardiomyopathy, unspecified: Secondary | ICD-10-CM

## 2016-12-08 NOTE — Progress Notes (Signed)
EPIC Encounter for ICM Monitoring  Patient Name: Paul [name]. is a 76 y.o. male Date: 12/08/2016 Primary Care Physican: Caralyn Guile, DO Primary Cardiologist:[place] Electrophysiologist: Allred Dry Weight: 170 lbs Bi-V Pacing: 100%           Heart Failure questions reviewed, pt symptomatic with chest congestion due to cold.  [facility] Buspir[facility] 10 mg 0.5 tablet (5 mg total) twice a day for anxiety.    Thoracic impedance abnormal suggesting fluid accumulation.  Prescribed dosage:Furosemide 20 mg 1 tablet (20 mg total) as needed. Potassium 1 tablet (20 mEq total) by mouth daily as needed (onthe days when takingLasix).  Labs: (also has drawn at [facility]) 07/25/2015 Creatinine 1.48, BUN 26, Potassium 4.1, Sodium 141, EGFR 46-56  Recommendations:  Advised to take Furosemide 20 mg 1 tablet daily x 3 days and Potassium 20 mEq 1 tablet by daily x 3 days and then return to PRN for both medications as prescribed  Follow-up plan: ICM clinic phone appointment on 12/16/2016 to recheck fluid levels.    Copy of ICM check sent to Dr. Rayann Heman and Dr. Domenic Polite for review.   3 month ICM trend: 12/08/2016   1 Year ICM trend:      [name], RN 12/08/2016 3:31 PM

## 2016-12-09 LAB — CUP PACEART REMOTE DEVICE CHECK
Brady Statistic AP VP Percent: 0.18 %
Brady Statistic AP VS Percent: 0.01 %
Brady Statistic AS VP Percent: 99.79 %
Brady Statistic AS VS Percent: 0.01 %
Date Time Interrogation Session: 20181105113629
HIGH POWER IMPEDANCE MEASURED VALUE: 437 Ohm
HIGH POWER IMPEDANCE MEASURED VALUE: 51 Ohm
HighPow Impedance: 61 Ohm
Implantable Lead Implant Date: 20081014
Implantable Lead Implant Date: 20081014
Implantable Lead Location: 753858
Implantable Lead Location: 753859
Implantable Lead Serial Number: 128499
Lead Channel Impedance Value: 399 Ohm
Lead Channel Impedance Value: 513 Ohm
Lead Channel Impedance Value: 532 Ohm
Lead Channel Impedance Value: 589 Ohm
Lead Channel Impedance Value: 950 Ohm
Lead Channel Pacing Threshold Amplitude: 0.625 V
Lead Channel Pacing Threshold Amplitude: 0.625 V
Lead Channel Pacing Threshold Pulse Width: 0.4 ms
Lead Channel Pacing Threshold Pulse Width: 0.4 ms
Lead Channel Sensing Intrinsic Amplitude: 18.375 mV
Lead Channel Sensing Intrinsic Amplitude: 18.375 mV
Lead Channel Sensing Intrinsic Amplitude: 2.25 mV
Lead Channel Setting Pacing Amplitude: 2 V
Lead Channel Setting Pacing Amplitude: 2.5 V
Lead Channel Setting Pacing Pulse Width: 0.4 ms
Lead Channel Setting Sensing Sensitivity: 0.3 mV
MDC IDC LEAD IMPLANT DT: 20081014
MDC IDC LEAD LOCATION: 753860
MDC IDC MSMT BATTERY VOLTAGE: 2.86 V
MDC IDC MSMT LEADCHNL RA SENSING INTR AMPL: 2.25 mV
MDC IDC MSMT LEADCHNL RV PACING THRESHOLD AMPLITUDE: 1.25 V
MDC IDC MSMT LEADCHNL RV PACING THRESHOLD PULSEWIDTH: 0.4 ms
MDC IDC PG IMPLANT DT: 20131126
MDC IDC SET LEADCHNL LV PACING AMPLITUDE: 2 V
MDC IDC SET LEADCHNL RV PACING PULSEWIDTH: 0.4 ms
MDC IDC STAT BRADY RA PERCENT PACED: 0.2 %
MDC IDC STAT BRADY RV PERCENT PACED: 99.95 %

## 2016-12-09 NOTE — Progress Notes (Signed)
Remote ICD transmission.   

## 2016-12-10 ENCOUNTER — Encounter: Payer: Self-pay | Admitting: Cardiology

## 2016-12-16 ENCOUNTER — Ambulatory Visit (INDEPENDENT_AMBULATORY_CARE_PROVIDER_SITE_OTHER): Payer: Self-pay

## 2016-12-16 ENCOUNTER — Telehealth: Payer: Self-pay | Admitting: Cardiology

## 2016-12-16 DIAGNOSIS — Z95810 Presence of automatic (implantable) cardiac defibrillator: Secondary | ICD-10-CM

## 2016-12-16 DIAGNOSIS — I5022 Chronic systolic (congestive) heart failure: Secondary | ICD-10-CM

## 2016-12-16 NOTE — Telephone Encounter (Signed)
LMOVM reminding pt to send remote transmission.   

## 2016-12-18 ENCOUNTER — Telehealth: Payer: Self-pay

## 2016-12-18 NOTE — Progress Notes (Signed)
EPIC Encounter for ICM Monitoring  Patient Name: Paul [name]. is a 76 y.o. male Date: 12/18/2016 Primary Care Physican: Caralyn Guile, DO Primary Cardiologist:[place] Electrophysiologist: Allred Dry Weight: 168 lbs Bi-V Pacing:99.9%       Heart Failure questions reviewed, pt was feeling congested and took PRN Lasix for 3 days and now is asymptomatic.   Thoracic impedance normal but was abnormal suggesting fluid accumulation 11/19/2016 to 12/11/2016.  Patient has taken PRN Furosemide for past several days.   Prescribed dosage: Furosemide 20 mg 1 tablet (20 mg total) as needed. Potassium 1 tablet (20 mEq total) by mouth daily as needed (onthe days when takingLasix).  Labs: (also has drawn at [facility]) 07/25/2015 Creatinine 1.48, BUN 26, Potassium 4.1, Sodium 141, EGFR 46-56  Recommendations: No changes. Encouraged to call for fluid symptoms. Patient does not have monitor by bedside and advised to send manual transmission.  He is unable to place monitor by bedside.   Follow-up plan: ICM clinic phone appointment on 01/19/2017.    Copy of ICM check sent to Dr. Rayann Heman.   3 month ICM trend: 12/18/2016    1 Year ICM trend:       Paul Billings, RN 12/18/2016 11:32 AM

## 2016-12-18 NOTE — Telephone Encounter (Signed)
Returned patient call as requested by voice mail message regarding ICM transmission.  Advised no ICM transmission was received and he will send a manual one.

## 2016-12-18 NOTE — Addendum Note (Signed)
Addended by: [name] on: 12/18/2016 11:46 AM   Modules accepted: Level of Service

## 2017-01-19 ENCOUNTER — Ambulatory Visit (INDEPENDENT_AMBULATORY_CARE_PROVIDER_SITE_OTHER): Payer: Medicare Other

## 2017-01-19 DIAGNOSIS — I5022 Chronic systolic (congestive) heart failure: Secondary | ICD-10-CM

## 2017-01-19 DIAGNOSIS — Z95810 Presence of automatic (implantable) cardiac defibrillator: Secondary | ICD-10-CM | POA: Diagnosis not present

## 2017-01-20 NOTE — Progress Notes (Signed)
EPIC Encounter for ICM Monitoring  Patient Name: Paul [name]. is a 76 y.o. male Date: 01/20/2017 Primary Care Physican: Caralyn Guile, DO Primary Cardiologist:[place] Electrophysiologist: Allred Dry Weight: 164.2lbs Bi-V Pacing:100%      Heart Failure questions reviewed, pt asymptomatic.   Thoracic impedance trending just below baseline.  Prescribed dosage: Furosemide 20 mg 1 tablet (20 mg total) as needed. Potassium 1 tablet (20 mEq total) by mouth daily as needed (onthe days when takingLasix).  Labs:(also has drawn at [facility]) 07/25/2015 Creatinine 1.48, BUN 26, Potassium 4.1, Sodium 141, EGFR 46-56  Recommendations:  Patient takes Furosemide as needed.  Encouraged to call for fluid symptoms.  Follow-up plan: ICM clinic phone appointment on 02/19/2017.    Copy of ICM check sent to Dr. Rayann Heman.   3 month ICM trend: 01/19/2017    1 Year ICM trend:       [name], RN 01/20/2017 2:53 PM

## 2017-02-17 ENCOUNTER — Telehealth: Payer: Self-pay

## 2017-02-17 NOTE — Telephone Encounter (Signed)
Attempted call back to patient as requested by voice mail asking for date of next ICM transmission.  Left message that next ICM transmission is 02/19/17

## 2017-02-19 ENCOUNTER — Ambulatory Visit (INDEPENDENT_AMBULATORY_CARE_PROVIDER_SITE_OTHER): Payer: Medicare Other

## 2017-02-19 ENCOUNTER — Telehealth: Payer: Self-pay

## 2017-02-19 DIAGNOSIS — Z95810 Presence of automatic (implantable) cardiac defibrillator: Secondary | ICD-10-CM

## 2017-02-19 DIAGNOSIS — I5022 Chronic systolic (congestive) heart failure: Secondary | ICD-10-CM

## 2017-02-19 NOTE — Telephone Encounter (Signed)
Remote ICM transmission received.  Attempted call to patient and left detailed message regarding transmission and next ICM scheduled for 03/23/2017.  Advised to return call for any fluid symptoms or questions.

## 2017-02-19 NOTE — Progress Notes (Signed)
EPIC Encounter for ICM Monitoring  Patient Name: Paul [name]. is a 77 y.o. male Date: 02/19/2017 Primary Care Physican: Caralyn Guile, DO Primary [place] Electrophysiologist: Allred Dry Weight: Previous weight 164.2lbs Bi-V Pacing:100%        Attempted call to patient and unable to reach.  Left detailed message regarding transmission.  Transmission reviewed.    Thoracic impedance normal.  Prescribed dosage: Furosemide 20 mg 1 tablet (20 mg total) as needed. Potassium 1 tablet (20 mEq total) by mouth daily as needed (onthe days when takingLasix).  Labs:(also has drawn at [facility]) 07/25/2015 Creatinine 1.48, BUN 26, Potassium 4.1, Sodium 141, EGFR 46-56  Recommendations: Left voice mail with ICM number and encouraged to call if experiencing any fluid symptoms.  Follow-up plan: ICM clinic phone appointment on 03/23/2017.   Copy of ICM check sent to Dr. Rayann Heman   3 month ICM trend: 02/19/2017    1 Year ICM trend:       [name], RN 02/19/2017 11:30 AM

## 2017-03-09 ENCOUNTER — Ambulatory Visit (INDEPENDENT_AMBULATORY_CARE_PROVIDER_SITE_OTHER): Payer: Medicare Other | Admitting: *Deleted

## 2017-03-09 DIAGNOSIS — I429 Cardiomyopathy, unspecified: Secondary | ICD-10-CM | POA: Diagnosis not present

## 2017-03-09 NOTE — Progress Notes (Signed)
Remote ICD transmission.   

## 2017-03-10 ENCOUNTER — Encounter: Payer: Self-pay | Admitting: Cardiology

## 2017-03-23 ENCOUNTER — Ambulatory Visit (INDEPENDENT_AMBULATORY_CARE_PROVIDER_SITE_OTHER): Payer: Medicare Other

## 2017-03-23 DIAGNOSIS — I5022 Chronic systolic (congestive) heart failure: Secondary | ICD-10-CM | POA: Diagnosis not present

## 2017-03-23 DIAGNOSIS — Z95810 Presence of automatic (implantable) cardiac defibrillator: Secondary | ICD-10-CM

## 2017-03-23 NOTE — Progress Notes (Signed)
EPIC Encounter for ICM Monitoring  Patient Name: Paul [name]. is a 77 y.o. male Date: 03/23/2017 Primary Care Physican: Caralyn Guile, DO Primary Cardiologist:[place] Electrophysiologist: Allred Dry Weight: 161lbs Bi-V Pacing:100%         Heart Failure questions reviewed, pt asymptomatic.   Thoracic impedance abnormal suggesting fluid accumulation since 03/16/2017.  Prescribed dosage: Furosemide 20 mg 1 tablet (20 mg total) as needed. Potassium 1 tablet (20 mEq total) by mouth daily as needed (onthe days when takingLasix).  Labs:(also has drawn at [facility]) 07/25/2015 Creatinine 1.48, BUN 26, Potassium 4.1, Sodium 141, EGFR 46-56  Recommendations:  Advised to take PRN Furosemide and Potassium as prescribed for next 3 days.   Follow-up plan: ICM clinic phone appointment on 03/27/2017 to recheck fluid levels.  Office appointment scheduled 05/20/2017 with Dr. Domenic Polite..  Copy of ICM check sent to Dr. Rayann Heman and Dr Domenic Polite.   3 month ICM trend: 03/23/2017   1 Year ICM trend:       [name], RN 03/23/2017 3:30 PM

## 2017-03-27 ENCOUNTER — Ambulatory Visit (INDEPENDENT_AMBULATORY_CARE_PROVIDER_SITE_OTHER): Payer: Medicare Other

## 2017-03-27 ENCOUNTER — Telehealth: Payer: Self-pay

## 2017-03-27 DIAGNOSIS — I5022 Chronic systolic (congestive) heart failure: Secondary | ICD-10-CM

## 2017-03-27 DIAGNOSIS — Z95810 Presence of automatic (implantable) cardiac defibrillator: Secondary | ICD-10-CM

## 2017-03-27 NOTE — Telephone Encounter (Signed)
Remote ICM transmission received.  Attempted call to patient and left detailed message per DPR regarding transmission and next ICM scheduled for 04/23/2017.  Advised to return call for any fluid symptoms or questions.

## 2017-03-27 NOTE — Progress Notes (Signed)
EPIC Encounter for ICM Monitoring  Patient Name: Paul [name]. is a 77 y.o. male Date: 03/27/2017 Primary Care Physican: Caralyn Guile, DO Primary [place] Electrophysiologist: Allred Dry Weight:Previous weight 161lbs Bi-V Pacing:100%              Attempted call to patient and unable to reach.  Left detailed message regarding transmission.  Transmission reviewed.    Thoracic impedance has returned close to baseline after taking PRN Furosemide x 3 days.  Prescribed dosage: Furosemide 20 mg 1 tablet (20 mg total) as needed. Potassium 1 tablet (20 mEq total) by mouth daily as needed (onthe days when takingLasix).  Labs:(also has drawn at [facility]) 07/25/2015 Creatinine 1.48, BUN 26, Potassium 4.1, Sodium 141, EGFR 46-56  Recommendations: Left voice mail with ICM number and encouraged to call if experiencing any fluid symptoms.  Follow-up plan: ICM clinic phone appointment on 04/23/2017.    Copy of ICM check sent to Dr. Rayann Heman.   3 month ICM trend: 03/27/2017    1 Year ICM trend:       Paul Billings, RN 03/27/2017 8:30 AM

## 2017-03-29 LAB — CUP PACEART REMOTE DEVICE CHECK
Battery Voltage: 2.76 V
Brady Statistic AP VP Percent: 0.2 %
Brady Statistic AP VS Percent: 0.01 %
Brady Statistic AS VP Percent: 99.78 %
Date Time Interrogation Session: 20190204062308
HIGH POWER IMPEDANCE MEASURED VALUE: 399 Ohm
HIGH POWER IMPEDANCE MEASURED VALUE: 51 Ohm
HighPow Impedance: 70 Ohm
Implantable Lead Implant Date: 20081014
Implantable Lead Location: 753858
Implantable Lead Location: 753859
Implantable Lead Location: 753860
Implantable Lead Model: 4542
Implantable Lead Model: 5076
Implantable Pulse Generator Implant Date: 20131126
Lead Channel Impedance Value: 456 Ohm
Lead Channel Impedance Value: 532 Ohm
Lead Channel Impedance Value: 627 Ohm
Lead Channel Impedance Value: 969 Ohm
Lead Channel Pacing Threshold Amplitude: 0.625 V
Lead Channel Pacing Threshold Amplitude: 0.625 V
Lead Channel Pacing Threshold Pulse Width: 0.4 ms
Lead Channel Pacing Threshold Pulse Width: 0.4 ms
Lead Channel Sensing Intrinsic Amplitude: 19.125 mV
Lead Channel Sensing Intrinsic Amplitude: 2 mV
Lead Channel Setting Pacing Amplitude: 2 V
Lead Channel Setting Pacing Amplitude: 2.5 V
Lead Channel Setting Pacing Pulse Width: 0.4 ms
Lead Channel Setting Sensing Sensitivity: 0.3 mV
MDC IDC LEAD IMPLANT DT: 20081014
MDC IDC LEAD IMPLANT DT: 20081014
MDC IDC LEAD SERIAL: 128499
MDC IDC MSMT LEADCHNL LV PACING THRESHOLD PULSEWIDTH: 0.4 ms
MDC IDC MSMT LEADCHNL RA IMPEDANCE VALUE: 399 Ohm
MDC IDC MSMT LEADCHNL RA SENSING INTR AMPL: 2 mV
MDC IDC MSMT LEADCHNL RV PACING THRESHOLD AMPLITUDE: 1.125 V
MDC IDC MSMT LEADCHNL RV SENSING INTR AMPL: 19.125 mV
MDC IDC SET LEADCHNL LV PACING AMPLITUDE: 2 V
MDC IDC SET LEADCHNL LV PACING PULSEWIDTH: 0.4 ms
MDC IDC STAT BRADY AS VS PERCENT: 0.01 %
MDC IDC STAT BRADY RA PERCENT PACED: 0.21 %
MDC IDC STAT BRADY RV PERCENT PACED: 99.93 %

## 2017-04-23 ENCOUNTER — Ambulatory Visit (INDEPENDENT_AMBULATORY_CARE_PROVIDER_SITE_OTHER): Payer: Medicare Other

## 2017-04-23 DIAGNOSIS — I5022 Chronic systolic (congestive) heart failure: Secondary | ICD-10-CM | POA: Diagnosis not present

## 2017-04-23 DIAGNOSIS — Z95810 Presence of automatic (implantable) cardiac defibrillator: Secondary | ICD-10-CM | POA: Diagnosis not present

## 2017-04-23 NOTE — Progress Notes (Signed)
EPIC Encounter for ICM Monitoring  Patient Name: Paul [name]. is a 77 y.o. male Date: 04/23/2017 Primary Care Physican: Caralyn Guile, DO Primary Cardiologist:[place] Electrophysiologist: Allred Dry Weight:Previous weight 161lbs Bi-V Pacing:100%       Heart Failure questions reviewed, pt asymptomatic.  He reported he is feeling fine.    Thoracic impedance close to baseline normal but was abnormal suggesting fluid accumulation from 04/02/2017 through today.  Prescribed dosage: Furosemide 20 mg 1 tablet (20 mg total) as needed. Potassium 1 tablet (20 mEq total) by mouth daily as needed (onthe days when takingLasix).  Labs:(also has drawn at [facility]) 07/25/2015 Creatinine 1.48, BUN 26, Potassium 4.1, Sodium 141, EGFR 46-56  Recommendations: Advised to take PRN Furosemide x 1 day since he is close to baseline.  He reported he does not salt his foods but does not review food labels.  Advised 65 - 70 % of salt is in the food bought at the grocery store and encouraged him to review food labels.  Encouraged to call for fluid symptoms.  Follow-up plan: ICM clinic phone appointment on 05/25/2017.  Office appointment scheduled 05/19/2017 with Dr. Domenic Polite.  Copy of ICM check sent to Dr. Rayann Heman and Dr. Domenic Polite.   3 month ICM trend: 04/23/2017    1 Year ICM trend:       [name], RN 04/23/2017 12:52 PM

## 2017-05-19 NOTE — Progress Notes (Signed)
Cardiology Office Note  Date: 05/20/2017   ID: Paul [name]., DOB Sep 08, 1940, MRN [id number]  PCP: Paul Guile, DO  Primary Cardiologist: Paul Lesches, MD   Chief Complaint  Patient presents with  . Cardiac follow-up    History of Present Illness: Paul Isa. is a 77 y.o. male last seen in April 2018.  He presents for a routine follow-up visit.  Denies chest pain and describes NYHA class II dyspnea.  He does not have any orthopnea or PND.  He continues to follow with Dr. 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 Last device check was in late March.  He only occasionally has changes in his thoracic impedance and uses Lasix very infrequently.  Weight has been stable.  I reviewed his medications.  Cardiac regimen includes Lipitor, Coreg, Lasix as needed, potassium supplements as needed, and omega-3 supplements.  I reviewed his recent lab work done through the [place] [facility].  I personally reviewed his ECG today which shows a ventricular paced rhythm with atrial tracking.  Past Medical History:  Diagnosis Date  . Alcohol abuse    In the past. Stable over many years  . BPH (benign prostatic hyperplasia)   . CAD (coronary artery disease)    Distal circumflex and OM disease with collaterals 2008  . Cardiomyopathy    LVEF 25% up to 65%  . COPD (chronic obstructive pulmonary disease) ([place])   . Dyslipidemia   . Essential hypertension   . Gout   . ICD (implantable cardiac defibrillator) battery depletion    CRT-D device  . LBBB (left bundle branch block)   . Lung nodule    [facility]  . Migraines   . Mitral regurgitation    Severe before CRT with annular dilatation  . Smoking    Chantix cause headaches  . Syncope    Probably orthostasis    Past Surgical History:  Procedure Laterality Date  . BI-VENTRICULAR IMPLANTABLE CARDIOVERTER DEFIBRILLATOR N/A 12/30/2011   Procedure: BI-VENTRICULAR IMPLANTABLE CARDIOVERTER  DEFIBRILLATOR  (CRT-D);  Surgeon: Paul Grayer, MD;  Location: [facility] C[facility] LAB;  Service: Cardiovascular;  Laterality: N/A;  . CARDIAC DEFIBRILLATOR PLACEMENT  12/29/12   BiV ICD implanted, generator change 12/30/11 by Dr Paul [name] MDT Protecta XT CRT-D  . ORIF ANKLE FRACTURE Right 08/31/2014   Procedure: OPEN REDUCTION INTERNAL FIXATION (ORIF) ANKLE FRACTURE;  Surgeon: Paul Killings, MD;  Location: [place];  Service: Orthopedics;  Laterality: Right;    Current Outpatient Medications  Medication Sig Dispense Refill  . albuterol (PROVENTIL HFA;VENTOLIN HFA) 108 (90 Base) MCG/ACT inhaler Inhale 2 puffs into the lungs every 4 (four) hours as needed for wheezing or shortness of breath.    . Albuterol Sulfate 108 (90 Base) MCG/ACT AEPB Inhale into the lungs.    Marland Kitchen atorvastatin (LIPITOR) 80 MG tablet Take 80 mg by mouth daily.    . busPIRone (BUSPAR) 10 MG tablet Take 10 mg by mouth. Take 0.5 tablet (5 mg total) twice a day    . carbamide peroxide (DEBROX) 6.5 % otic solution Place 5 drops into both ears daily as needed (for ear vwax removal).    . carvedilol (COREG) 25 MG tablet Take 12.5 mg by mouth 2 (two) times daily.    . cetirizine (ZYRTEC) 10 MG tablet Take 10 mg by mouth daily.    . Cholecalciferol (VITAMIN D3) 2000 units TABS Take 2 tablets by mouth daily.    . diclofenac sodium (  VOLTAREN) 1 % GEL Apply 2 g topically as needed.    . divalproex (DEPAKOTE) 500 MG 24 hr tablet Take 1,500 mg by mouth at bedtime.      . finasteride (PROSCAR) 5 MG tablet Take 5 mg by mouth daily.      . folic acid (FOLVITE) 1 MG tablet Take 1 mg by mouth daily.      . furosemide (LASIX) 40 MG tablet Take 0.5 tablets (20 mg total) by mouth daily as needed for fluid or edema (weight gain > 3lbs). (Patient taking differently: Take 20 mg by mouth daily as needed for fluid or edema (weight gain > 3lbs). 06/05/16 [facility] 20 mg take 1 tablet by mouth as needed) 30 tablet   . gabapentin (NEURONTIN) 300  MG capsule Take 300-600 mg by mouth at bedtime.     . hydroxypropyl methylcellulose (ISOPTO TEARS) 2.5 % ophthalmic solution Place 1 drop into both eyes 4 (four) times daily as needed for dry eyes.     Marland Kitchen loratadine (CLARITIN) 10 MG tablet Take 10 mg by mouth daily.    . Multiple Vitamin (MULTIVITAMIN) tablet Take 1 tablet by mouth daily.      . nitroGLYCERIN (NITROSTAT) 0.4 MG SL tablet Place 0.4 mg under the tongue every 5 (five) minutes as needed.      . Omega-3 Fatty Acids (FISH OIL) 1000 MG CAPS Take 1 capsule by mouth daily.    Marland Kitchen omeprazole (PRILOSEC) 20 MG capsule Take 20 mg by mouth daily.      . potassium chloride SA (K-DUR,KLOR-CON) 20 MEQ tablet Take 1 tablet (20 mEq total) by mouth daily as needed (In the days when you take Lasix).    Marland Kitchen tiotropium (SPIRIVA) 18 MCG inhalation capsule Place 18 mcg into inhaler and inhale every morning.       No current facility-administered medications for this visit.    Allergies:  Codeine; Morphine sulfate; and Mupirocin   Social History: The patient  reports that he has been smoking cigarettes and e-cigarettes.  He started smoking about 61 years ago. He has a 22.50 pack-year smoking history. He has never used smokeless tobacco. He reports that he drinks alcohol. He reports that he does not use drugs.   ROS:  Please see the history of present illness. Otherwise, complete review of systems is positive for hearing loss, arthritis pain.  All other systems are reviewed and negative.   Physical Exam: VS:  BP 98/62   Pulse 64   Ht 5\' 7"  (1.702 m)   Wt 172 lb (78 kg)   SpO2 98%   BMI 26.94 kg/m , BMI Body mass index is 26.94 kg/m.  Wt Readings from Last 3 Encounters:  05/20/17 172 lb (78 kg)  09/05/16 171 lb 12.8 oz (77.9 kg)  05/19/16 174 lb 12.8 oz (79.3 kg)    General: Elderly male, appears comfortable at rest. HEENT: Conjunctiva and lids normal, oropharynx clear. Neck: Supple, no elevated JVP or carotid bruits, no thyromegaly. Lungs: Clear  to auscultation, nonlabored breathing at rest. Cardiac: Regular rate and rhythm, no S3 or significant systolic murmur. Abdomen: Soft, nontender, bowel sounds present. Extremities: No pitting edema, distal pulses 2+. Skin: Warm and dry. Musculoskeletal: No kyphosis. Neuropsychiatric: Alert and oriented x3, affect grossly appropriate.  ECG: I personally reviewed the tracing from 09/07/2015 which showed a ventricular paced rhythm.  Recent Labwork:  March 2019: Cholesterol 154, triglycerides 169, HDL 31, LDL 89, hemoglobin 14.6, platelets 235, potassium 4.4, BUN  15, creatinine 0.99, AST 16, ALT 17, TSH 0.89  Other Studies Reviewed Today:  Echocardiogram 09/02/2014: Study Conclusions  - Left ventricle: The cavity size was normal. Wall thickness was normal. Systolic function was vigorous. The estimated ejection fraction was in the range of 65% to 70%. Doppler parameters are consistent with abnormal left ventricular relaxation (grade 1 diastolic dysfunction). - Ventricular septum: Septal motion showed abnormal function and dyssynergy. These changes are consistent with intraventricular conduction delay. - Right ventricle: Pacer wire or catheter noted in right ventricle. - Right atrium: Pacer wire or catheter noted in right atrium.  Assessment and Plan:  1.  History of nonischemic cardiomyopathy with normalization of LVEF on medical therapy and status post biventricular ICD.  Last assessment was in 2016.  Follow-up echocardiogram will be obtained.  2.  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  Patient has had no device shocks.  He continues to follow with Dr. Rayann [name].  3.  Essential hypertension, blood pressure low normal today.  He is asymptomatic.  4.  CAD with history of distal circumflex and obtuse marginal disease associated with collaterals and no active angina.  He continues on statin therapy with recent LDL 89.  Current medicines were reviewed with the patient  today.   Orders Placed This Encounter  Procedures  . EKG 12-Lead    Disposition: Follow-up in 1 year.  Signed, Satira Sark, MD, [facility] 05/20/2017 1:10 PM    [place] [facility] [place], [place], [place] [address] Phone: [phone]; Fax: [phone]

## 2017-05-20 ENCOUNTER — Encounter: Payer: Self-pay | Admitting: Cardiology

## 2017-05-20 ENCOUNTER — Ambulatory Visit (INDEPENDENT_AMBULATORY_CARE_PROVIDER_SITE_OTHER): Payer: Medicare Other | Admitting: Cardiology

## 2017-05-20 VITALS — BP 98/62 | HR 64 | Ht 67.0 in | Wt 172.0 lb

## 2017-05-20 DIAGNOSIS — Z95810 Presence of automatic (implantable) cardiac defibrillator: Secondary | ICD-10-CM | POA: Diagnosis not present

## 2017-05-20 DIAGNOSIS — Z8679 Personal history of other diseases of the circulatory system: Secondary | ICD-10-CM | POA: Diagnosis not present

## 2017-05-20 DIAGNOSIS — I10 Essential (primary) hypertension: Secondary | ICD-10-CM

## 2017-05-20 DIAGNOSIS — I2510 Atherosclerotic heart disease of native coronary artery without angina pectoris: Secondary | ICD-10-CM

## 2017-05-20 NOTE — Patient Instructions (Signed)

## 2017-05-25 ENCOUNTER — Telehealth: Payer: Self-pay

## 2017-05-25 ENCOUNTER — Ambulatory Visit (INDEPENDENT_AMBULATORY_CARE_PROVIDER_SITE_OTHER): Payer: Medicare Other

## 2017-05-25 DIAGNOSIS — I5022 Chronic systolic (congestive) heart failure: Secondary | ICD-10-CM | POA: Diagnosis not present

## 2017-05-25 DIAGNOSIS — Z95810 Presence of automatic (implantable) cardiac defibrillator: Secondary | ICD-10-CM | POA: Diagnosis not present

## 2017-05-25 NOTE — Progress Notes (Signed)
EPIC Encounter for ICM Monitoring  Patient Name: Paul [name]. is a 77 y.o. male Date: 05/25/2017 Primary Care Physican: Caralyn Guile, DO Primary Cardiologist:[place] Electrophysiologist: Allred Dry Weight:Previous weight161lbs Bi-V Pacing:99.9%       Attempted call to patient and unable to reach.  Left detailed message regarding transmission.  Transmission reviewed.    Thoracic impedance normal.  Prescribed dosage: Furosemide 20 mg 1 tablet (20 mg total) as needed. Potassium 1 tablet (20 mEq total) by mouth daily as needed (onthe days when takingLasix).  Labs:(also has drawn at [facility]) 04/27/2017 Creatinine 0.99, BUN 15, Potassium 4.4, Sodium 142, EGFR >60  Recommendations: Left voice mail with ICM number and encouraged to call if experiencing any fluid symptoms.  Follow-up plan: ICM clinic phone appointment on 06/25/2017.    Copy of ICM check sent to Dr. Rayann Heman.   3 month ICM trend: 05/25/2017    1 Year ICM trend:       [name], RN 05/25/2017 4:37 PM

## 2017-05-25 NOTE — Telephone Encounter (Signed)
Remote ICM transmission received.  Attempted call to patient and left detailed message per DPR regarding transmission and next ICM scheduled for 06/25/2017.  Advised to return call for any fluid symptoms or questions.

## 2017-05-27 ENCOUNTER — Other Ambulatory Visit: Payer: Self-pay | Admitting: Cardiology

## 2017-05-27 DIAGNOSIS — I429 Cardiomyopathy, unspecified: Secondary | ICD-10-CM

## 2017-06-08 ENCOUNTER — Ambulatory Visit (INDEPENDENT_AMBULATORY_CARE_PROVIDER_SITE_OTHER): Payer: Medicare Other | Admitting: *Deleted

## 2017-06-08 DIAGNOSIS — I429 Cardiomyopathy, unspecified: Secondary | ICD-10-CM

## 2017-06-09 NOTE — Progress Notes (Signed)
Remote ICD transmission.   

## 2017-06-10 ENCOUNTER — Encounter: Payer: Self-pay | Admitting: Cardiology

## 2017-06-11 ENCOUNTER — Other Ambulatory Visit: Payer: Self-pay

## 2017-06-11 ENCOUNTER — Ambulatory Visit (INDEPENDENT_AMBULATORY_CARE_PROVIDER_SITE_OTHER): Payer: Medicare Other

## 2017-06-11 DIAGNOSIS — I429 Cardiomyopathy, unspecified: Secondary | ICD-10-CM | POA: Diagnosis not present

## 2017-06-12 ENCOUNTER — Telehealth: Payer: Self-pay

## 2017-06-12 NOTE — Telephone Encounter (Signed)
Patient notified. Routed to PCP 

## 2017-06-12 NOTE — Telephone Encounter (Signed)
-----   Message from Satira Sark, MD sent at 06/12/2017  8:28 AM EDT ----- Results reviewed.  LVEF low normal range at 50 to 55%.  Continue with medical therapy and follow-up plan. A copy of this test should be forwarded to Alcus Dad E, DO.

## 2017-06-23 LAB — CUP PACEART REMOTE DEVICE CHECK
Battery Voltage: 2.7 V
Brady Statistic AP VP Percent: 0.15 %
Brady Statistic AP VS Percent: 0.01 %
Brady Statistic AS VP Percent: 99.76 %
Brady Statistic AS VS Percent: 0.09 %
Brady Statistic RV Percent Paced: 99.87 %
Date Time Interrogation Session: 20190506041809
HIGH POWER IMPEDANCE MEASURED VALUE: 437 Ohm
HighPow Impedance: 55 Ohm
HighPow Impedance: 75 Ohm
Implantable Lead Implant Date: 20081014
Implantable Lead Implant Date: 20081014
Implantable Lead Location: 753858
Implantable Lead Model: 4542
Lead Channel Impedance Value: 437 Ohm
Lead Channel Impedance Value: 532 Ohm
Lead Channel Impedance Value: 589 Ohm
Lead Channel Impedance Value: 988 Ohm
Lead Channel Pacing Threshold Amplitude: 0.625 V
Lead Channel Pacing Threshold Amplitude: 1 V
Lead Channel Pacing Threshold Pulse Width: 0.4 ms
Lead Channel Sensing Intrinsic Amplitude: 2.5 mV
Lead Channel Sensing Intrinsic Amplitude: 2.5 mV
Lead Channel Setting Pacing Amplitude: 2 V
Lead Channel Setting Sensing Sensitivity: 0.3 mV
MDC IDC LEAD IMPLANT DT: 20081014
MDC IDC LEAD LOCATION: 753859
MDC IDC LEAD LOCATION: 753860
MDC IDC LEAD SERIAL: 128499
MDC IDC MSMT LEADCHNL LV IMPEDANCE VALUE: 646 Ohm
MDC IDC MSMT LEADCHNL LV PACING THRESHOLD AMPLITUDE: 0.75 V
MDC IDC MSMT LEADCHNL LV PACING THRESHOLD PULSEWIDTH: 0.4 ms
MDC IDC MSMT LEADCHNL RV PACING THRESHOLD PULSEWIDTH: 0.4 ms
MDC IDC MSMT LEADCHNL RV SENSING INTR AMPL: 20.25 mV
MDC IDC MSMT LEADCHNL RV SENSING INTR AMPL: 20.25 mV
MDC IDC PG IMPLANT DT: 20131126
MDC IDC SET LEADCHNL LV PACING AMPLITUDE: 2 V
MDC IDC SET LEADCHNL LV PACING PULSEWIDTH: 0.4 ms
MDC IDC SET LEADCHNL RV PACING AMPLITUDE: 2.5 V
MDC IDC SET LEADCHNL RV PACING PULSEWIDTH: 0.4 ms
MDC IDC STAT BRADY RA PERCENT PACED: 0.16 %

## 2017-06-25 ENCOUNTER — Ambulatory Visit (INDEPENDENT_AMBULATORY_CARE_PROVIDER_SITE_OTHER): Payer: Medicare Other

## 2017-06-25 DIAGNOSIS — Z95810 Presence of automatic (implantable) cardiac defibrillator: Secondary | ICD-10-CM

## 2017-06-25 DIAGNOSIS — I5022 Chronic systolic (congestive) heart failure: Secondary | ICD-10-CM | POA: Diagnosis not present

## 2017-06-25 NOTE — Progress Notes (Signed)
EPIC Encounter for ICM Monitoring  Patient Name: Paul [name]. is a 77 y.o. male Date: 06/25/2017 Primary Care Physican: Caralyn Guile, DO Primary [place] Electrophysiologist: Allred Dry Weight:158lbs Bi-V Pacing:99.9%       Heart Failure questions reviewed, pt asymptomatic now but did have some fluid a few days ago and took PRN Furosemide.   Thoracic impedance normal.  Prescribed dosage: Furosemide 20 mg 1 tablet (20 mg total) as needed. Potassium 1 tablet (20 mEq total) by mouth daily as needed (onthe days when takingLasix).  Labs:(also has drawn at [facility]) 04/27/2017 Creatinine 0.99, BUN 15, Potassium 4.4, Sodium 142, EGFR >60  Recommendations:  No changes.  Reinforced fluid restriction to < 2 L daily and sodium restriction to less than 2000 mg daily.  Encouraged to call for fluid symptoms.  Follow-up plan: ICM clinic phone appointment on 07/27/2017.  Office appointment scheduled 09/04/2017 with Dr. Rayann Heman.  Copy of ICM check sent to Dr. Rayann Heman.   3 month ICM trend: 06/25/2017    1 Year ICM trend:       [name], RN 06/25/2017 1:58 PM

## 2017-07-27 ENCOUNTER — Ambulatory Visit (INDEPENDENT_AMBULATORY_CARE_PROVIDER_SITE_OTHER): Payer: Medicare Other

## 2017-07-27 DIAGNOSIS — Z95810 Presence of automatic (implantable) cardiac defibrillator: Secondary | ICD-10-CM | POA: Diagnosis not present

## 2017-07-27 DIAGNOSIS — I5022 Chronic systolic (congestive) heart failure: Secondary | ICD-10-CM

## 2017-07-28 NOTE — Progress Notes (Signed)
EPIC Encounter for ICM Monitoring  Patient Name: Paul [name]. is a 77 y.o. male Date: 07/28/2017 Primary Care Physican: Caralyn Guile, DO Primary Cardiologist:[place] Electrophysiologist: Allred Dry Weight:160lbs Bi-V Pacing:99.9%       Heart Failure questions reviewed, pt asymptomatic.  He took PRN Furosemide due to weight gain during decreased impedance which resolved weight gain.   Thoracic impedance abnormal suggesting fluid accumulation starting 07/08/2017 almost at baseline on 07/27/2017..  Prescribed dosage: Furosemide 20 mg 1 tablet (20 mg total) as needed. Potassium 1 tablet (20 mEq total) by mouth daily as needed (onthe days when takingLasix).  Labs:(also has drawn at [facility]) 04/27/2017 Creatinine 0.99, BUN 15, Potassium 4.4, Sodium 142, EGFR >60  Recommendations: No changes.   Encouraged to call for fluid symptoms.  Follow-up plan: ICM clinic phone appointment on 10/06/2017.  Office appointment scheduled 09/04/2017 with Dr. Rayann Heman.   Copy of ICM check sent to Dr. Rayann Heman.   3 month ICM trend: 07/27/2017    1 Year ICM trend:       [name], RN 07/28/2017 12:25 PM

## 2017-09-04 ENCOUNTER — Ambulatory Visit (INDEPENDENT_AMBULATORY_CARE_PROVIDER_SITE_OTHER): Payer: Medicare Other | Admitting: Internal Medicine

## 2017-09-04 ENCOUNTER — Encounter: Payer: Self-pay | Admitting: Internal Medicine

## 2017-09-04 VITALS — BP 150/106 | HR 65 | Ht 67.0 in | Wt 162.2 lb

## 2017-09-04 DIAGNOSIS — I5022 Chronic systolic (congestive) heart failure: Secondary | ICD-10-CM

## 2017-09-04 DIAGNOSIS — Z95810 Presence of automatic (implantable) cardiac defibrillator: Secondary | ICD-10-CM

## 2017-09-04 DIAGNOSIS — I2510 Atherosclerotic heart disease of native coronary artery without angina pectoris: Secondary | ICD-10-CM | POA: Diagnosis not present

## 2017-09-04 DIAGNOSIS — I429 Cardiomyopathy, unspecified: Secondary | ICD-10-CM

## 2017-09-04 NOTE — Progress Notes (Signed)
PCP: Caralyn Guile, DO Primary Cardiologist: Dr Domenic Polite Primary EP: Dr Rayann Heman  Paul [name]. is a 77 y.o. male who presents today for routine electrophysiology followup.  Since last being seen in our clinic, the patient reports doing very well.  Today, he denies symptoms of palpitations, chest pain, shortness of breath,  lower extremity edema, dizziness, presyncope, syncope, or ICD shocks.  The patient is otherwise without complaint today.   Past Medical History:  Diagnosis Date  . Alcohol abuse    In the past. Stable over many years  . BPH (benign prostatic hyperplasia)   . CAD (coronary artery disease)    Distal circumflex and OM disease with collaterals 2008  . Cardiomyopathy    LVEF 25% up to 65%  . COPD (chronic obstructive pulmonary disease) ([place])   . Dyslipidemia   . Essential hypertension   . Gout   . ICD (implantable cardiac defibrillator) battery depletion    CRT-D device  . LBBB (left bundle branch block)   . Lung nodule    [facility]  . Migraines   . Mitral regurgitation    Severe before CRT with annular dilatation  . Smoking    Chantix cause headaches  . Syncope    Probably orthostasis   Past Surgical History:  Procedure Laterality Date  . BI-VENTRICULAR IMPLANTABLE CARDIOVERTER DEFIBRILLATOR N/A 12/30/2011   Procedure: BI-VENTRICULAR IMPLANTABLE CARDIOVERTER DEFIBRILLATOR  (CRT-D);  Surgeon: Thompson Grayer, MD;  Location: [facility] CATH LAB;  Service: Cardiovascular;  Laterality: N/A;  . CARDIAC DEFIBRILLATOR PLACEMENT  12/29/12   BiV ICD implanted, generator change 12/30/11 by Dr Rayann Heman MDT Protecta XT CRT-D  . ORIF ANKLE FRACTURE Right 08/31/2014   Procedure: OPEN REDUCTION INTERNAL FIXATION (ORIF) ANKLE FRACTURE;  Surgeon: Marybelle Killings, MD;  Location: [place];  Service: Orthopedics;  Laterality: Right;    ROS- all systems are reviewed and negative except as per HPI above  Current Outpatient Medications  Medication Sig Dispense Refill  . albuterol  (PROVENTIL HFA;VENTOLIN HFA) 108 (90 Base) MCG/ACT inhaler Inhale 2 puffs into the lungs every 4 (four) hours as needed for wheezing or shortness of breath.    . Albuterol Sulfate 108 (90 Base) MCG/ACT AEPB Inhale into the lungs.    Marland Kitchen atorvastatin (LIPITOR) 80 MG tablet Take 80 mg by mouth daily.    . busPIRone (BUSPAR) 10 MG tablet Take 10 mg by mouth. Take 0.5 tablet (5 mg total) twice a day    . carbamide peroxide (DEBROX) 6.5 % otic solution Place 5 drops into both ears daily as needed (for ear vwax removal).    . carvedilol (COREG) 25 MG tablet Take 12.5 mg by mouth 2 (two) times daily.    . cetirizine (ZYRTEC) 10 MG tablet Take 10 mg by mouth daily.    . Cholecalciferol (VITAMIN D3) 2000 units TABS Take 2 tablets by mouth daily.    . diclofenac sodium (VOLTAREN) 1 % GEL Apply 2 g topically as needed.    . divalproex (DEPAKOTE) 500 MG 24 hr tablet Take 1,500 mg by mouth at bedtime.      . finasteride (PROSCAR) 5 MG tablet Take 5 mg by mouth daily.      . folic acid (FOLVITE) 1 MG tablet Take 1 mg by mouth daily.      . furosemide (LASIX) 40 MG tablet Take 0.5 tablets (20 mg total) by mouth daily as needed for fluid or edema (weight gain > 3lbs). (Patient taking differently: Take  20 mg by mouth daily as needed for fluid or edema (weight gain > 3lbs). 06/05/16 [facility] 20 mg take 1 tablet by mouth as needed) 30 tablet   . gabapentin (NEURONTIN) 300 MG capsule Take 300-600 mg by mouth at bedtime.     . hydroxypropyl methylcellulose (ISOPTO TEARS) 2.5 % ophthalmic solution Place 1 drop into both eyes 4 (four) times daily as needed for dry eyes.     Marland Kitchen loratadine (CLARITIN) 10 MG tablet Take 10 mg by mouth daily.    . Melatonin 10 MG TABS Take 1 tablet by mouth daily.    . Multiple Vitamin (MULTIVITAMIN) tablet Take 1 tablet by mouth daily.      . nitroGLYCERIN (NITROSTAT) 0.4 MG SL tablet Place 0.4 mg under the tongue every 5 (five) minutes as needed.      . Omega-3  Fatty Acids (FISH OIL) 1000 MG CAPS Take 1 capsule by mouth daily.    Marland Kitchen omeprazole (PRILOSEC) 20 MG capsule Take 20 mg by mouth daily.      . potassium chloride SA (K-DUR,KLOR-CON) 20 MEQ tablet Take 1 tablet (20 mEq total) by mouth daily as needed (In the days when you take Lasix).    Marland Kitchen tiotropium (SPIRIVA) 18 MCG inhalation capsule Place 18 mcg into inhaler and inhale every morning.       No current facility-administered medications for this visit.     Physical Exam: Vitals:   09/04/17 0815  BP: (!) 150/106  Pulse: 65  SpO2: 100%  Weight: 162 lb 3.2 oz (73.6 kg)  Height: 5\' 7"  (1.702 m)    GEN- The patient is well appearing, alert and oriented x 3 today.   Head- normocephalic, atraumatic Eyes-  Sclera clear, conjunctiva pink Ears- hearing intact Oropharynx- clear Lungs- Clear to ausculation bilaterally, normal work of breathing Chest- ICD pocket is well healed Heart- Regular rate and rhythm, no murmurs, rubs or gallops, PMI not laterally displaced GI- soft, NT, ND, + BS Extremities- no clubbing, cyanosis, or edema  ICD interrogation- reviewed in detail today,  See PACEART report    Wt Readings from Last 3 Encounters:  09/04/17 162 lb 3.2 oz (73.6 kg)  05/20/17 172 lb (78 kg)  09/05/16 171 lb 12.8 oz (77.9 kg)    Assessment and Plan:  1.  Chronic systolic dysfunction/ LBBB euvolemic today Stable on an appropriate medical regimen Normal ICD function See Pace Art report No changes today followed in ICM device clinic He is approaching ERI.  He is very clear that he wishes to have generator change through [place].  He states that he will arrange this through [facility] and his primary [place] physician.  Alert tones demonstrated.  Compliant with remotes.   2. Tobacco Cessation advised today  3. HTN Elevated BP today.  He has not yet taken his morning medicines  [facility] Return 4 months to make sure that his ICD generator change was handled with the [place].  He is instructed  to contact my office if he is unable to arrange through the [facility]  Thompson Grayer MD, [facility] 09/04/2017 8:41 AM

## 2017-09-04 NOTE — Patient Instructions (Addendum)
Medication Instructions:  Continue all current medications.  Labwork: none  Testing/Procedures: none  Follow-Up: 4 months   Any Other Special Instructions Will Be Listed Below (If Applicable). Remote monitoring is used to monitor your Pacemaker of ICD from home. This monitoring reduces the number of office visits required to check your device to one time per year. It allows [place] to keep an eye on the functioning of your device to ensure it is working properly. You are scheduled for a device check from home on 09/07/2017. You may send your transmission at any time that day. If you have a wireless device, the transmission will be sent automatically. After your physician reviews your transmission, you will receive a postcard with your next transmission date.  If you need a refill on your cardiac medications before your next appointment, please call your pharmacy.

## 2017-09-07 ENCOUNTER — Ambulatory Visit (INDEPENDENT_AMBULATORY_CARE_PROVIDER_SITE_OTHER): Payer: Medicare Other | Admitting: *Deleted

## 2017-09-07 ENCOUNTER — Telehealth: Payer: Self-pay | Admitting: Internal Medicine

## 2017-09-07 DIAGNOSIS — I5022 Chronic systolic (congestive) heart failure: Secondary | ICD-10-CM

## 2017-09-07 DIAGNOSIS — I429 Cardiomyopathy, unspecified: Secondary | ICD-10-CM

## 2017-09-07 NOTE — Telephone Encounter (Deleted)
Patient called stating that his PCP needs to have documentation stating that

## 2017-09-07 NOTE — Telephone Encounter (Signed)
Message fwd to device clinic.

## 2017-09-07 NOTE — Telephone Encounter (Signed)
LVM for pt to call back direct number to device clinic given.   Pt called back stated that a letter needed to be faxed to Dr. Carie Caddy stating that his device needed to be changed out in order to get the appropriate appointments made so he could have the generator chance done through the [place] for insurance purposes.   Letter faxed to Dr. Krystal Clark office at the [facility] at [place].

## 2017-09-07 NOTE — Telephone Encounter (Signed)
Patient called stating that he was told he needs his device (battery Changed out) He wants to go to the [place] in [place].  He states that his PCP will need to initial the visit to [place]. Patient is requesting that a note be faxed to PCP stating what he is going to need in order to get procedure started. Please fax to [phone]

## 2017-09-08 NOTE — Telephone Encounter (Signed)
Patient called to verify the letter to Dr Carie Caddy was sent so Paul [name] can have gen change at the [place].  Advised the letter was faxed to the office as requested.  Paul [name] said the surgeon is on vacation until next week and once Paul [name] returns the surgery will be scheduled.

## 2017-09-09 ENCOUNTER — Telehealth: Payer: Self-pay

## 2017-09-09 NOTE — Telephone Encounter (Signed)
LMOVM reminding pt to send remote transmission.   

## 2017-09-09 NOTE — Progress Notes (Signed)
Remote ICD transmission.   

## 2017-09-11 ENCOUNTER — Telehealth: Payer: Self-pay

## 2017-09-11 NOTE — Telephone Encounter (Signed)
Returned patient call as requested.  He asked if transmission was received. I advised the transmission was received.

## 2017-09-15 ENCOUNTER — Encounter: Payer: Self-pay | Admitting: Cardiology

## 2017-09-21 LAB — CUP PACEART INCLINIC DEVICE CHECK
Brady Statistic AP VS Percent: 0.01 %
Brady Statistic AS VP Percent: 99.69 %
Brady Statistic RA Percent Paced: 0.27 %
Date Time Interrogation Session: 20190802114537
HIGH POWER IMPEDANCE MEASURED VALUE: 50 Ohm
HIGH POWER IMPEDANCE MEASURED VALUE: 63 Ohm
HighPow Impedance: 456 Ohm
Implantable Lead Implant Date: 20081014
Implantable Lead Serial Number: 128499
Implantable Pulse Generator Implant Date: 20131126
Lead Channel Impedance Value: 532 Ohm
Lead Channel Impedance Value: 570 Ohm
Lead Channel Pacing Threshold Amplitude: 1 V
Lead Channel Pacing Threshold Pulse Width: 0.4 ms
Lead Channel Setting Pacing Amplitude: 2 V
Lead Channel Setting Pacing Pulse Width: 0.4 ms
Lead Channel Setting Pacing Pulse Width: 0.4 ms
MDC IDC LEAD IMPLANT DT: 20081014
MDC IDC LEAD IMPLANT DT: 20081014
MDC IDC LEAD LOCATION: 753858
MDC IDC LEAD LOCATION: 753859
MDC IDC LEAD LOCATION: 753860
MDC IDC MSMT BATTERY VOLTAGE: 2.64 V
MDC IDC MSMT LEADCHNL LV IMPEDANCE VALUE: 513 Ohm
MDC IDC MSMT LEADCHNL LV IMPEDANCE VALUE: 912 Ohm
MDC IDC MSMT LEADCHNL LV PACING THRESHOLD AMPLITUDE: 0.5 V
MDC IDC MSMT LEADCHNL RA IMPEDANCE VALUE: 399 Ohm
MDC IDC MSMT LEADCHNL RA PACING THRESHOLD AMPLITUDE: 0.75 V
MDC IDC MSMT LEADCHNL RA PACING THRESHOLD PULSEWIDTH: 0.4 ms
MDC IDC MSMT LEADCHNL RA SENSING INTR AMPL: 2.75 mV
MDC IDC MSMT LEADCHNL RV PACING THRESHOLD PULSEWIDTH: 0.4 ms
MDC IDC SET LEADCHNL LV PACING AMPLITUDE: 2 V
MDC IDC SET LEADCHNL RV PACING AMPLITUDE: 2.5 V
MDC IDC SET LEADCHNL RV SENSING SENSITIVITY: 0.3 mV
MDC IDC STAT BRADY AP VP PERCENT: 0.26 %
MDC IDC STAT BRADY AS VS PERCENT: 0.04 %
MDC IDC STAT BRADY RV PERCENT PACED: 99.89 %

## 2017-09-22 LAB — CUP PACEART REMOTE DEVICE CHECK
Brady Statistic AP VS Percent: 0.01 %
Brady Statistic AS VS Percent: 0.01 %
Brady Statistic RV Percent Paced: 99.72 %
Date Time Interrogation Session: 20190807164452
HIGH POWER IMPEDANCE MEASURED VALUE: 456 Ohm
HIGH POWER IMPEDANCE MEASURED VALUE: 69 Ohm
HighPow Impedance: 55 Ohm
Implantable Lead Implant Date: 20081014
Implantable Lead Implant Date: 20081014
Implantable Lead Model: 6947
Implantable Lead Serial Number: 128499
Implantable Pulse Generator Implant Date: 20131126
Lead Channel Impedance Value: 570 Ohm
Lead Channel Impedance Value: 570 Ohm
Lead Channel Impedance Value: 665 Ohm
Lead Channel Pacing Threshold Amplitude: 0.75 V
Lead Channel Pacing Threshold Amplitude: 1.125 V
Lead Channel Pacing Threshold Pulse Width: 0.4 ms
Lead Channel Pacing Threshold Pulse Width: 0.4 ms
Lead Channel Sensing Intrinsic Amplitude: 18.875 mV
Lead Channel Sensing Intrinsic Amplitude: 18.875 mV
Lead Channel Setting Pacing Amplitude: 2 V
Lead Channel Setting Pacing Amplitude: 2 V
Lead Channel Setting Pacing Amplitude: 2.5 V
Lead Channel Setting Pacing Pulse Width: 0.4 ms
Lead Channel Setting Pacing Pulse Width: 0.4 ms
MDC IDC LEAD IMPLANT DT: 20081014
MDC IDC LEAD LOCATION: 753858
MDC IDC LEAD LOCATION: 753859
MDC IDC LEAD LOCATION: 753860
MDC IDC MSMT BATTERY VOLTAGE: 2.63 V
MDC IDC MSMT LEADCHNL LV IMPEDANCE VALUE: 988 Ohm
MDC IDC MSMT LEADCHNL RA IMPEDANCE VALUE: 456 Ohm
MDC IDC MSMT LEADCHNL RA PACING THRESHOLD AMPLITUDE: 0.625 V
MDC IDC MSMT LEADCHNL RA SENSING INTR AMPL: 2.5 mV
MDC IDC MSMT LEADCHNL RA SENSING INTR AMPL: 2.5 mV
MDC IDC MSMT LEADCHNL RV PACING THRESHOLD PULSEWIDTH: 0.4 ms
MDC IDC SET LEADCHNL RV SENSING SENSITIVITY: 0.3 mV
MDC IDC STAT BRADY AP VP PERCENT: 0.65 %
MDC IDC STAT BRADY AS VP PERCENT: 99.34 %
MDC IDC STAT BRADY RA PERCENT PACED: 0.65 %

## 2017-10-06 ENCOUNTER — Ambulatory Visit (INDEPENDENT_AMBULATORY_CARE_PROVIDER_SITE_OTHER): Payer: Medicare Other

## 2017-10-06 DIAGNOSIS — I5022 Chronic systolic (congestive) heart failure: Secondary | ICD-10-CM | POA: Diagnosis not present

## 2017-10-06 DIAGNOSIS — Z95810 Presence of automatic (implantable) cardiac defibrillator: Secondary | ICD-10-CM | POA: Diagnosis not present

## 2017-10-06 NOTE — Progress Notes (Signed)
EPIC Encounter for ICM Monitoring  Patient Name: Bernerd Terhune. is a 77 y.o. male Date: 10/06/2017 Primary Care Physican: Caralyn Guile, DO Primary Cardiologist:[place] Electrophysiologist: Allred Dry Weight:Previous weight 160lbs Bi-V Pacing:99.6%      Heart Failure questions reviewed, pt asymptomatic  Approaching ERI and plans to have device change at the [place].   Thoracic impedance normal but was abnormal suggesting fluid accumulation from 09/17/2017 - 09/27/2017.  Prescribed dosage: Furosemide 20 mg 1 tablet (20 mg total) as needed. Potassium 1 tablet (20 mEq total) by mouth daily as needed (onthe days when takingLasix).  Labs:(also has drawn at [facility]) 04/27/2017 Creatinine 0.99, BUN 15, Potassium 4.4, Sodium 142, EGFR >60  Recommendations: No changes.   Encouraged to call for fluid symptoms.  Follow-up plan: ICM clinic phone appointment on 11/05/2017.   Office appointment scheduled 01/08/2018 with Dr. Rayann Heman.    Copy of ICM check sent to Dr. Rayann Heman.   3 month ICM trend: 10/06/2017    1 Year ICM trend:       [name], RN 10/06/2017 12:28 PM

## 2017-10-27 ENCOUNTER — Telehealth: Payer: Self-pay

## 2017-10-27 NOTE — Telephone Encounter (Signed)
Patient called to provide update on device [facility].  [facility] and the plan is for the battery to be replaced because it is more affordable for him to go to the [place].  He said as soon as they schedule the procedure he will call back.

## 2017-11-05 ENCOUNTER — Ambulatory Visit (INDEPENDENT_AMBULATORY_CARE_PROVIDER_SITE_OTHER): Payer: Medicare Other

## 2017-11-05 DIAGNOSIS — Z95810 Presence of automatic (implantable) cardiac defibrillator: Secondary | ICD-10-CM | POA: Diagnosis not present

## 2017-11-05 DIAGNOSIS — I5022 Chronic systolic (congestive) heart failure: Secondary | ICD-10-CM

## 2017-11-05 NOTE — Progress Notes (Signed)
EPIC Encounter for ICM Monitoring  Patient Name: Paul [name]. is a 77 y.o. male Date: 11/05/2017 Primary Care Physican: Caralyn Guile, DO Primary Cardiologist:[place] Electrophysiologist: Allred Dry Weight:160lbs Bi-V Pacing:99.8%     Battery at 2.62 V     Heart Failure questions reviewed, pt asymptomatic.  Patient plans on having [facility] do the gen change when ERI reached.    Thoracic impedance normal.  Prescribed: Furosemide 20 mg 1 tablet (20 mg total) as needed. Potassium 1 tablet (20 mEq total) by mouth daily as needed (onthe days when takingLasix).  Labs:(also has drawn at [facility]) 04/27/2017 Creatinine 0.99, BUN 15, Potassium 4.4, Sodium 142, EGFR >60  Recommendations: No changes.   Encouraged to call for fluid symptoms.  Follow-up plan: ICM clinic phone appointment on 12/10/2017.   Office appointment scheduled 01/08/2018 with Dr. Rayann Heman.    Copy of ICM check sent to Dr. Rayann Heman.   3 month ICM trend: 11/05/2017    1 Year ICM trend:       [name], RN 11/05/2017 9:57 AM

## 2017-11-12 ENCOUNTER — Telehealth: Payer: Self-pay

## 2017-11-12 NOTE — Telephone Encounter (Signed)
Returned patient call.  He reported his Medtronic device battery change is scheduled 11/27/2017 at [place]. The wound care follow up will also be at the [facility] hospital.  He will continue to follow up with Dr Rayann Heman yearly and device transmissions will continue with [facility] device clinic.

## 2017-11-30 ENCOUNTER — Telehealth: Payer: Self-pay

## 2017-11-30 NOTE — Telephone Encounter (Signed)
Patient reported [facility] and wound looks good.  He said he is doing well.  He notified [place] office so Dr Rayann Heman would know that the procedure has been completed.  ICM Remote transmission rescheduled for 01/04/2018

## 2017-12-10 ENCOUNTER — Encounter: Payer: Medicare Other | Admitting: *Deleted

## 2017-12-25 ENCOUNTER — Telehealth: Payer: Self-pay

## 2017-12-25 NOTE — Telephone Encounter (Signed)
Returned patient call as requested by voice mail message.  He said the chest incision for the new [facility] is healing well and the device is working fine.  He said the [place] said he only needed to do device checks every 3 months and he prefers to stay with [facility] device clinic but no longer wants to have fluid levels checked monthly.  Advised the device clinic will monitor every 3 months per protocol and to call Dr Jackalyn Lombard office for any further information regarding his device.  He said it is better for him financially to monitor every 3 months.  Disenrolled from [facility] clinic at patient's request.

## 2018-01-08 ENCOUNTER — Encounter: Payer: Self-pay | Admitting: Internal Medicine

## 2018-01-08 ENCOUNTER — Ambulatory Visit (INDEPENDENT_AMBULATORY_CARE_PROVIDER_SITE_OTHER): Payer: Medicare Other | Admitting: Internal Medicine

## 2018-01-08 VITALS — BP 118/68 | HR 58 | Ht 67.0 in | Wt 157.8 lb

## 2018-01-08 DIAGNOSIS — I5022 Chronic systolic (congestive) heart failure: Secondary | ICD-10-CM

## 2018-01-08 DIAGNOSIS — I10 Essential (primary) hypertension: Secondary | ICD-10-CM | POA: Diagnosis not present

## 2018-01-08 DIAGNOSIS — I447 Left bundle-branch block, unspecified: Secondary | ICD-10-CM | POA: Diagnosis not present

## 2018-01-08 DIAGNOSIS — I2510 Atherosclerotic heart disease of native coronary artery without angina pectoris: Secondary | ICD-10-CM

## 2018-01-08 NOTE — Progress Notes (Signed)
PCP: Caralyn Guile, DO Primary Cardiologist: Dr Domenic Polite Primary EP: Dr Rayann Heman  Paul [name]. is a 77 y.o. male who presents today for routine electrophysiology followup.  Since his recent generator change through the [place], the patient reports doing very well.  Today, he denies symptoms of palpitations, chest pain, shortness of breath,  lower extremity edema, dizziness, presyncope, syncope, or ICD shocks.  The patient is otherwise without complaint today.   Past Medical History:  Diagnosis Date  . Alcohol abuse    In the past. Stable over many years  . BPH (benign prostatic hyperplasia)   . CAD (coronary artery disease)    Distal circumflex and OM disease with collaterals 2008  . Cardiomyopathy    LVEF 25% up to 65%  . COPD (chronic obstructive pulmonary disease) ([place])   . Dyslipidemia   . Essential hypertension   . Gout   . ICD (implantable cardiac defibrillator) battery depletion    CRT-D device  . LBBB (left bundle branch block)   . Lung nodule    [facility]  . Migraines   . Mitral regurgitation    Severe before CRT with annular dilatation  . Smoking    Chantix cause headaches  . Syncope    Probably orthostasis   Past Surgical History:  Procedure Laterality Date  . BI-VENTRICULAR IMPLANTABLE CARDIOVERTER DEFIBRILLATOR N/A 12/30/2011   Procedure: BI-VENTRICULAR IMPLANTABLE CARDIOVERTER DEFIBRILLATOR  (CRT-D);  Surgeon: Thompson Grayer, MD;  Location: [facility] C[facility] LAB;  Service: Cardiovascular;  Laterality: N/A;  . CARDIAC DEFIBRILLATOR PLACEMENT  12/29/12   BiV ICD implanted, generator change 12/30/11 by Dr Rayann Heman MDT Protecta XT CRT-D  . ORIF ANKLE FRACTURE Right 08/31/2014   Procedure: OPEN REDUCTION INTERNAL FIXATION (ORIF) ANKLE FRACTURE;  Surgeon: Marybelle Killings, MD;  Location: [place];  Service: Orthopedics;  Laterality: Right;    ROS- all systems are reviewed and negative except as per HPI above  Current Outpatient Medications  Medication Sig Dispense Refill    . albuterol (PROVENTIL HFA;VENTOLIN HFA) 108 (90 Base) MCG/ACT inhaler Inhale 2 puffs into the lungs every 4 (four) hours as needed for wheezing or shortness of breath.    . Albuterol Sulfate 108 (90 Base) MCG/ACT AEPB Inhale into the lungs.    Marland Kitchen atorvastatin (LIPITOR) 80 MG tablet Take 80 mg by mouth daily.    . busPIRone (BUSPAR) 10 MG tablet Take 10 mg by mouth. Take 0.5 tablet (5 mg total) twice a day    . carbamide peroxide (DEBROX) 6.5 % otic solution Place 5 drops into both ears daily as needed (for ear vwax removal).    . carvedilol (COREG) 25 MG tablet Take 12.5 mg by mouth 2 (two) times daily.    . cetirizine (ZYRTEC) 10 MG tablet Take 10 mg by mouth daily.    . Cholecalciferol (VITAMIN D3) 2000 units TABS Take 2 tablets by mouth daily.    . diclofenac sodium (VOLTAREN) 1 % GEL Apply 2 g topically as needed.    . divalproex (DEPAKOTE) 500 MG 24 hr tablet Take 1,500 mg by mouth at bedtime.      . finasteride (PROSCAR) 5 MG tablet Take 5 mg by mouth daily.      . folic acid (FOLVITE) 1 MG tablet Take 1 mg by mouth daily.      . furosemide (LASIX) 40 MG tablet Take 0.5 tablets (20 mg total) by mouth daily as needed for fluid or edema (weight gain > 3lbs). (Patient taking  differently: Take 20 mg by mouth daily as needed for fluid or edema (weight gain > 3lbs). 06/05/16 [facility] reported [facility] Furosemide 20 mg take 1 tablet by mouth as needed) 30 tablet   . gabapentin (NEURONTIN) 300 MG capsule Take 300-600 mg by mouth at bedtime.     . hydroxypropyl methylcellulose (ISOPTO TEARS) 2.5 % ophthalmic solution Place 1 drop into both eyes 4 (four) times daily as needed for dry eyes.     Marland Kitchen loratadine (CLARITIN) 10 MG tablet Take 10 mg by mouth daily.    . Melatonin 10 MG TABS Take 1 tablet by mouth daily.    . Multiple Vitamin (MULTIVITAMIN) tablet Take 1 tablet by mouth daily.      . nitroGLYCERIN (NITROSTAT) 0.4 MG SL tablet Place 0.4 mg under the tongue every 5 (five) minutes as needed.       . Omega-3 Fatty Acids (FISH OIL) 1000 MG CAPS Take 1 capsule by mouth daily.    Marland Kitchen omeprazole (PRILOSEC) 20 MG capsule Take 20 mg by mouth daily.      . potassium chloride SA (K-DUR,KLOR-CON) 20 MEQ tablet Take 1 tablet (20 mEq total) by mouth daily as needed (In the days when you take Lasix).    Marland Kitchen tiotropium (SPIRIVA) 18 MCG inhalation capsule Place 18 mcg into inhaler and inhale every morning.       No current facility-administered medications for this visit.     Physical Exam: Vitals:   01/08/18 0818  BP: 118/68  Pulse: (!) 58  SpO2: 91%  Weight: 157 lb 12.8 oz (71.6 kg)  Height: 5\' 7"  (1.702 m)    GEN- The patient is well appearing, alert and oriented x 3 today.   Head- normocephalic, atraumatic Eyes-  Sclera clear, conjunctiva pink Ears- hearing intact Oropharynx- clear Lungs- Clear to ausculation bilaterally, normal work of breathing Chest- ICD pocket is well healed Heart- Regular rate and rhythm, no murmurs, rubs or gallops, PMI not laterally displaced GI- soft, NT, ND, + BS Extremities- no clubbing, cyanosis, or edema  ICD interrogation- reviewed in detail today,  See PACEART report    Wt Readings from Last 3 Encounters:  01/08/18 157 lb 12.8 oz (71.6 kg)  09/04/17 162 lb 3.2 oz (73.6 kg)  05/20/17 172 lb (78 kg)    Assessment and Plan:  1.  Chronic systolic dysfunctionLBBB euvolemic today Stable on an appropriate medical regimen Normal ICD function See Pace Art report No changes today  followed in [facility] device clinic  2. HTN Stable No change required today  3. Tobacco Cessation advised  carelink Return in a year  Thompson Grayer MD, [facility] 01/08/2018 9:21 AM

## 2018-01-08 NOTE — Patient Instructions (Signed)

## 2018-01-19 ENCOUNTER — Telehealth: Payer: Self-pay

## 2018-01-19 ENCOUNTER — Ambulatory Visit: Payer: Medicare Other

## 2018-01-19 NOTE — Telephone Encounter (Signed)
Attempted to confirm remote transmission with pt. No answer and was unable to leave a message.   

## 2018-01-20 ENCOUNTER — Encounter: Payer: Self-pay | Admitting: Cardiology

## 2018-01-21 ENCOUNTER — Telehealth: Payer: Self-pay | Admitting: Cardiology

## 2018-01-21 ENCOUNTER — Ambulatory Visit (INDEPENDENT_AMBULATORY_CARE_PROVIDER_SITE_OTHER): Payer: Medicare Other

## 2018-01-21 DIAGNOSIS — I5022 Chronic systolic (congestive) heart failure: Secondary | ICD-10-CM

## 2018-01-21 DIAGNOSIS — I429 Cardiomyopathy, unspecified: Secondary | ICD-10-CM

## 2018-01-21 NOTE — Telephone Encounter (Signed)
Received a message from [facility] RN to call patient. I called patient and LMOVM.

## 2018-01-22 LAB — CUP PACEART INCLINIC DEVICE CHECK
Implantable Lead Implant Date: 20081014
Implantable Lead Implant Date: 20081014
Implantable Lead Implant Date: 20081014
Implantable Lead Location: 753858
Implantable Lead Model: 4542
Implantable Lead Model: 6947
Implantable Lead Serial Number: 128499
MDC IDC LEAD LOCATION: 753859
MDC IDC LEAD LOCATION: 753860
MDC IDC PG IMPLANT DT: 20191025
MDC IDC SESS DTM: 20191220135016

## 2018-01-22 NOTE — Progress Notes (Signed)
Remote ICD transmission.   

## 2018-01-29 NOTE — Telephone Encounter (Signed)
Remote transmission received on 01-21-2018

## 2018-02-09 ENCOUNTER — Telehealth: Payer: Self-pay

## 2018-02-09 NOTE — Telephone Encounter (Signed)
Received voice mail message from patient asking for information about his next transmission.  Patient is followed by device clinic every 91 days and no longer in monthly ICM follow up.  Will forward to device clinic for follow up.

## 2018-02-09 NOTE — Telephone Encounter (Signed)
LMOVM informing pt that his next remote transmission is 04-22-2018.

## 2018-02-18 ENCOUNTER — Telehealth: Payer: Self-pay

## 2018-02-18 NOTE — Telephone Encounter (Signed)
Patient had questions about his letter. I informed him that his device is now checked every 3 months instead of monthly. Pt verbalized understanding.

## 2018-02-18 NOTE — Telephone Encounter (Signed)
Patient left voice mail message to call him back due to he has a device question.  Patient no longer in ICM follow and forwarded to device clinic triage for follow up.

## 2018-02-18 NOTE — Telephone Encounter (Signed)
LMOVM for pt to return call 

## 2018-02-21 LAB — CUP PACEART REMOTE DEVICE CHECK
Battery Remaining Longevity: 104 mo
Battery Voltage: 3.05 V
Brady Statistic AS VP Percent: 99.08 %
Date Time Interrogation Session: 20191219193612
HIGH POWER IMPEDANCE MEASURED VALUE: 50 Ohm
HIGH POWER IMPEDANCE MEASURED VALUE: 66 Ohm
Implantable Lead Implant Date: 20081014
Implantable Lead Location: 753858
Implantable Lead Location: 753859
Implantable Lead Model: 4542
Implantable Lead Model: 5076
Implantable Lead Model: 6947
Implantable Pulse Generator Implant Date: 20191025
Lead Channel Impedance Value: 418 Ohm
Lead Channel Pacing Threshold Amplitude: 0.875 V
Lead Channel Pacing Threshold Pulse Width: 0.4 ms
Lead Channel Pacing Threshold Pulse Width: 0.4 ms
Lead Channel Sensing Intrinsic Amplitude: 18.625 mV
Lead Channel Setting Pacing Amplitude: 1.25 V
Lead Channel Setting Pacing Amplitude: 2 V
MDC IDC LEAD IMPLANT DT: 20081014
MDC IDC LEAD IMPLANT DT: 20081014
MDC IDC LEAD LOCATION: 753860
MDC IDC LEAD SERIAL: 128499
MDC IDC MSMT LEADCHNL LV IMPEDANCE VALUE: 532 Ohm
MDC IDC MSMT LEADCHNL LV IMPEDANCE VALUE: 551 Ohm
MDC IDC MSMT LEADCHNL LV IMPEDANCE VALUE: 950 Ohm
MDC IDC MSMT LEADCHNL LV PACING THRESHOLD AMPLITUDE: 0.625 V
MDC IDC MSMT LEADCHNL RA IMPEDANCE VALUE: 456 Ohm
MDC IDC MSMT LEADCHNL RA PACING THRESHOLD AMPLITUDE: 0.625 V
MDC IDC MSMT LEADCHNL RA PACING THRESHOLD PULSEWIDTH: 0.4 ms
MDC IDC MSMT LEADCHNL RA SENSING INTR AMPL: 2.25 mV
MDC IDC MSMT LEADCHNL RA SENSING INTR AMPL: 2.25 mV
MDC IDC MSMT LEADCHNL RV IMPEDANCE VALUE: 513 Ohm
MDC IDC SET LEADCHNL LV PACING PULSEWIDTH: 0.4 ms
MDC IDC SET LEADCHNL RA PACING AMPLITUDE: 1.5 V
MDC IDC SET LEADCHNL RV PACING PULSEWIDTH: 0.4 ms
MDC IDC SET LEADCHNL RV SENSING SENSITIVITY: 0.3 mV
MDC IDC STAT BRADY AP VP PERCENT: 0.91 %
MDC IDC STAT BRADY AP VS PERCENT: 0.01 %
MDC IDC STAT BRADY AS VS PERCENT: 0 %
MDC IDC STAT BRADY RA PERCENT PACED: 0.92 %
MDC IDC STAT BRADY RV PERCENT PACED: 99.66 %

## 2018-04-22 ENCOUNTER — Ambulatory Visit (INDEPENDENT_AMBULATORY_CARE_PROVIDER_SITE_OTHER): Payer: Medicare Other | Admitting: *Deleted

## 2018-04-22 ENCOUNTER — Other Ambulatory Visit: Payer: Self-pay

## 2018-04-22 DIAGNOSIS — I429 Cardiomyopathy, unspecified: Secondary | ICD-10-CM

## 2018-04-22 DIAGNOSIS — I5022 Chronic systolic (congestive) heart failure: Secondary | ICD-10-CM

## 2018-04-22 LAB — CUP PACEART REMOTE DEVICE CHECK
Battery Remaining Longevity: 103 mo
Battery Voltage: 3.03 V
Brady Statistic AP VP Percent: 1.19 %
Brady Statistic AS VP Percent: 98.8 %
Brady Statistic RA Percent Paced: 1.2 %
Date Time Interrogation Session: 20200319041804
HIGH POWER IMPEDANCE MEASURED VALUE: 74 Ohm
HighPow Impedance: 58 Ohm
Implantable Lead Implant Date: 20081014
Implantable Lead Location: 753858
Implantable Lead Model: 4542
Implantable Lead Serial Number: 128499
Implantable Pulse Generator Implant Date: 20191025
Lead Channel Impedance Value: 1064 Ohm
Lead Channel Impedance Value: 475 Ohm
Lead Channel Impedance Value: 608 Ohm
Lead Channel Pacing Threshold Amplitude: 1 V
Lead Channel Pacing Threshold Pulse Width: 0.4 ms
Lead Channel Sensing Intrinsic Amplitude: 18.625 mV
Lead Channel Setting Pacing Amplitude: 1.25 V
Lead Channel Setting Pacing Pulse Width: 0.4 ms
MDC IDC LEAD IMPLANT DT: 20081014
MDC IDC LEAD IMPLANT DT: 20081014
MDC IDC LEAD LOCATION: 753859
MDC IDC LEAD LOCATION: 753860
MDC IDC MSMT LEADCHNL LV IMPEDANCE VALUE: 665 Ohm
MDC IDC MSMT LEADCHNL LV PACING THRESHOLD AMPLITUDE: 0.75 V
MDC IDC MSMT LEADCHNL LV PACING THRESHOLD PULSEWIDTH: 0.4 ms
MDC IDC MSMT LEADCHNL RA PACING THRESHOLD AMPLITUDE: 0.625 V
MDC IDC MSMT LEADCHNL RA PACING THRESHOLD PULSEWIDTH: 0.4 ms
MDC IDC MSMT LEADCHNL RA SENSING INTR AMPL: 2.125 mV
MDC IDC MSMT LEADCHNL RA SENSING INTR AMPL: 2.125 mV
MDC IDC MSMT LEADCHNL RV IMPEDANCE VALUE: 418 Ohm
MDC IDC MSMT LEADCHNL RV IMPEDANCE VALUE: 532 Ohm
MDC IDC SET LEADCHNL RA PACING AMPLITUDE: 1.5 V
MDC IDC SET LEADCHNL RV PACING AMPLITUDE: 2 V
MDC IDC SET LEADCHNL RV PACING PULSEWIDTH: 0.4 ms
MDC IDC SET LEADCHNL RV SENSING SENSITIVITY: 0.3 mV
MDC IDC STAT BRADY AP VS PERCENT: 0.01 %
MDC IDC STAT BRADY AS VS PERCENT: 0 %
MDC IDC STAT BRADY RV PERCENT PACED: 99.56 %

## 2018-04-30 NOTE — Progress Notes (Signed)
Remote ICD transmission.   

## 2018-05-13 ENCOUNTER — Telehealth: Payer: Self-pay | Admitting: *Deleted

## 2018-05-13 NOTE — Telephone Encounter (Signed)
Pt verbalized consent for telehealth appt with Dr Domenic Polite on 05/21/18. Reviewed pt meds/pharmacy/allergy/hx. Pt has BP monitor at home and will have those readings available

## 2018-05-20 NOTE — Progress Notes (Signed)
Virtual Visit via Telephone Note   This visit type was conducted due to national recommendations for restrictions regarding the COVID-19 Pandemic (e.g. social distancing) in an effort to limit this patient's exposure and mitigate transmission in our community.  Due to his co-morbid illnesses, this patient is at least at moderate risk for complications without adequate follow up.  This format is felt to be most appropriate for this patient at this time.  The patient did not have access to video technology/had technical difficulties with video requiring transitioning to audio format only (telephone).  All issues noted in this document were discussed and addressed.  No physical exam could be performed with this format.  Please refer to the patient's chart for his  consent to telehealth for [facility].   Evaluation Performed:  Follow-up visit  Date:  05/21/2018   ID:  Paul [name]., DOB [birth year], MRN [id number]  Patient Location: Home Provider Location: Home  PCP:  Caralyn Guile, DO  Cardiologist:  Satira Sark, MD Electrophysiologist:  Thompson Grayer, MD  Chief Complaint:  Follow-up cardiomyopathy  History of Present Illness:    Paul Bail. is a 78 y.o. male that I last saw in April 2019.  We spoke by phone today.  He reports no significant angina symptoms, NYHA class II dyspnea with basic chores around the house.  He has been staying at home.  His preacher goes for him to the grocery store.  Is not report any problems with his medications and states that he has been taking them regularly as outlined below.  He sees Dr. Rayann Heman in the device clinic, Medtronic biventricular ICD in place.  He does not report any device shocks or syncope.  He had a physical with lab work through the [facility] system within the last few months.  Follow-up echocardiogram from May 2019 revealed LVEF 50 to 55% with mild diastolic dysfunction.  The patient does not have symptoms concerning  for COVID-19 infection (fever, chills, cough, or new shortness of breath).    Past Medical History:  Diagnosis Date  . Alcohol abuse    In the past. Stable over many years  . BPH (benign prostatic hyperplasia)   . CAD (coronary artery disease)    Distal circumflex and OM disease with collaterals 2008  . Cardiomyopathy    LVEF 25% up to 65%  . COPD (chronic obstructive pulmonary disease) ([place])   . Dyslipidemia   . Essential hypertension   . Gout   . ICD (implantable cardiac defibrillator) battery depletion    CRT-D device  . LBBB (left bundle branch block)   . Lung nodule    [facility]  . Migraines   . Mitral regurgitation    Severe before CRT with annular dilatation  . Smoking    Chantix cause headaches  . Syncope    Probably orthostasis   Past Surgical History:  Procedure Laterality Date  . BI-VENTRICULAR IMPLANTABLE CARDIOVERTER DEFIBRILLATOR N/A 12/30/2011   Procedure: BI-VENTRICULAR IMPLANTABLE CARDIOVERTER DEFIBRILLATOR  (CRT-D);  Surgeon: Thompson Grayer, MD;  Location: [facility] CATH LAB;  Service: Cardiovascular;  Laterality: N/A;  . CARDIAC DEFIBRILLATOR PLACEMENT  12/29/12   BiV ICD implanted, generator change 12/30/11 by Dr Rayann Heman MDT Protecta XT CRT-D  . ORIF ANKLE FRACTURE Right 08/31/2014   Procedure: OPEN REDUCTION INTERNAL FIXATION (ORIF) ANKLE FRACTURE;  Surgeon: Marybelle Killings, MD;  Location: [place];  Service: Orthopedics;  Laterality: Right;     Current Meds  Medication Sig  . albuterol (PROVENTIL HFA;VENTOLIN HFA) 108 (90 Base) MCG/ACT inhaler Inhale 2 puffs into the lungs every 4 (four) hours as needed for wheezing or shortness of breath.  . Albuterol Sulfate 108 (90 Base) MCG/ACT AEPB Inhale into the lungs.  Marland Kitchen atorvastatin (LIPITOR) 80 MG tablet Take 80 mg by mouth daily.  . busPIRone (BUSPAR) 10 MG tablet Take 10 mg by mouth. Take 0.5 tablet (5 mg total) twice a day  . carbamide peroxide (DEBROX) 6.5 % otic solution Place 5 drops into both ears daily as  needed (for ear vwax removal).  . carvedilol (COREG) 25 MG tablet Take 12.5 mg by mouth 2 (two) times daily.  . cetirizine (ZYRTEC) 10 MG tablet Take 10 mg by mouth daily.  . Cholecalciferol (VITAMIN D3) 2000 units TABS Take 2 tablets by mouth daily.  . diclofenac sodium (VOLTAREN) 1 % GEL Apply 2 g topically as needed.  . divalproex (DEPAKOTE) 500 MG 24 hr tablet Take 1,500 mg by mouth at bedtime.    . finasteride (PROSCAR) 5 MG tablet Take 5 mg by mouth daily.    . folic acid (FOLVITE) 1 MG tablet Take 1 mg by mouth daily.    . furosemide (LASIX) 40 MG tablet Take 0.5 tablets (20 mg total) by mouth daily as needed for fluid or edema (weight gain > 3lbs). (Patient taking differently: Take 20 mg by mouth daily as needed for fluid or edema (weight gain > 3lbs). 06/05/16 Patient reported [facility] prescribed Furosemide 20 mg take 1 tablet by mouth as needed)  . gabapentin (NEURONTIN) 300 MG capsule Take 300-600 mg by mouth at bedtime.   . hydroxypropyl methylcellulose (ISOPTO TEARS) 2.5 % ophthalmic solution Place 1 drop into both eyes 4 (four) times daily as needed for dry eyes.   Marland Kitchen loratadine (CLARITIN) 10 MG tablet Take 10 mg by mouth daily.  . Melatonin 10 MG TABS Take 1 tablet by mouth daily.  . Multiple Vitamin (MULTIVITAMIN) tablet Take 1 tablet by mouth daily.    . nitroGLYCERIN (NITROSTAT) 0.4 MG SL tablet Place 0.4 mg under the tongue every 5 (five) minutes as needed.    . Omega-3 Fatty Acids (FISH OIL) 1000 MG CAPS Take 1 capsule by mouth daily.  Marland Kitchen omeprazole (PRILOSEC) 20 MG capsule Take 20 mg by mouth daily.    . potassium chloride SA (K-DUR,KLOR-CON) 20 MEQ tablet Take 1 tablet (20 mEq total) by mouth daily as needed (In the days when you take Lasix).  Marland Kitchen tiotropium (SPIRIVA) 18 MCG inhalation capsule Place 18 mcg into inhaler and inhale every morning.       Allergies:   Codeine; Morphine sulfate; and Mupirocin   Social History   Tobacco Use  . Smoking status: Current Every Day Smoker     Packs/day: 0.50    Years: 45.00    Pack years: 22.50    Types: Cigarettes, E-cigarettes    Start date: 02/04/1956    Last attempt to quit: 07/15/2012    Years since quitting: 5.8  . Smokeless tobacco: Never Used  . Tobacco comment: he quit in June! 05/25/14 - tying to quit again   Substance Use Topics  . Alcohol use: Yes    Alcohol/week: 0.0 standard drinks    Comment: Occasionally  . Drug use: No     Family Hx: The patient's family history includes Arthritis in his mother; Emphysema in his father; Lung cancer in his brother.  ROS:   Please see the history of present illness.  Chronic hearing loss. All other systems reviewed and are negative.   Prior CV studies:   The following studies were reviewed today:  Echocardiogram 06/11/2017: Study Conclusions  - Left ventricle: The cavity size was normal. Wall thickness was   normal. Systolic function was normal. The estimated ejection   fraction was in the range of 50% to 55%. Wall motion was normal;   there were no regional wall motion abnormalities. Doppler   parameters are consistent with abnormal left ventricular   relaxation (grade 1 diastolic dysfunction). - Aortic valve: Mildly calcified annulus. Mildly thickened   leaflets. Valve area (VTI): 2.06 cm^2. Valve area (Vmax): 2.32   cm^2. Valve area (Vmean): 2.09 cm^2. - Technically adequate study.  Labs/Other Tests and Data Reviewed:    EKG: I personally reviewed the tracing from 09/04/2017 which shows a ventricular paced rhythm with atrial tracking.  Recent Labs:  March 2019: Cholesterol 154, triglycerides 169, HDL 31, LDL 89, hemoglobin 14.6, platelets 235, potassium 4.4, BUN 15, creatinine 0.99, AST 16, ALT 17, TSH 0.89  Wt Readings from Last 3 Encounters:  05/21/18 159 lb (72.1 kg)  01/08/18 157 lb 12.8 oz (71.6 kg)  09/04/17 162 lb 3.2 oz (73.6 kg)     Objective:    Vital Signs:  BP 122/82   Pulse 72   Ht 5\' 7"  (1.702 m)   Wt 159 lb (72.1 kg)   BMI 24.90  kg/m    He answered questions spontaneously by phone. He was not breathless while speaking in full sentences.  ASSESSMENT & PLAN:    1.  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  He is symptomatically stable at this time.  Weight also stable.  No changes made to current medications.  2.  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  Keep follow-up with Dr. Rayann Heman.  No palpitations or syncope.  3.  CAD including distal circumflex and obtuse marginal disease associated with collaterals.  He reports no active angina symptoms.  COVID-19 Education: The signs and symptoms of COVID-19 were discussed with the patient and how to seek care for testing (follow up with PCP or arrange E-visit).  The importance of social distancing was discussed today.  Time:   Today, I have spent 7 minutes with the patient with telehealth technology discussing the above problems.     Medication Adjustments/Labs and Tests Ordered: Current medicines are reviewed at length with the patient today.  Concerns regarding medicines are outlined above.   Tests Ordered: No orders of the defined types were placed in this encounter.   Medication Changes: No orders of the defined types were placed in this encounter.   Disposition:  Follow up 1 year, sooner if needed.  Signed, Rozann Lesches, MD  05/21/2018 3:29 PM    Paul [name]

## 2018-05-21 ENCOUNTER — Telehealth (INDEPENDENT_AMBULATORY_CARE_PROVIDER_SITE_OTHER): Payer: Medicare Other | Admitting: Cardiology

## 2018-05-21 ENCOUNTER — Encounter: Payer: Self-pay | Admitting: Cardiology

## 2018-05-21 VITALS — BP 122/82 | HR 72 | Ht 67.0 in | Wt 159.0 lb

## 2018-05-21 DIAGNOSIS — Z7189 Other specified counseling: Secondary | ICD-10-CM

## 2018-05-21 DIAGNOSIS — I2510 Atherosclerotic heart disease of native coronary artery without angina pectoris: Secondary | ICD-10-CM | POA: Diagnosis not present

## 2018-05-21 DIAGNOSIS — Z8679 Personal history of other diseases of the circulatory system: Secondary | ICD-10-CM | POA: Diagnosis not present

## 2018-05-21 DIAGNOSIS — Z95810 Presence of automatic (implantable) cardiac defibrillator: Secondary | ICD-10-CM | POA: Diagnosis not present

## 2018-05-21 DIAGNOSIS — I10 Essential (primary) hypertension: Secondary | ICD-10-CM

## 2018-05-21 NOTE — Patient Instructions (Signed)
Medication Instructions:  Your physician recommends that you continue on your current medications as directed. Please refer to the Current Medication list given to you today.  Labwork: None today  Procedures/Testing: None today  Follow-Up: 1 year with Dr.McDowell  Any Additional Special Instructions Will Be Listed Below (If Applicable).     If you need a refill on your cardiac medications before your next appointment, please call your pharmacy.    Thank you for choosing [facility] [place] !

## 2018-07-22 ENCOUNTER — Ambulatory Visit (INDEPENDENT_AMBULATORY_CARE_PROVIDER_SITE_OTHER): Payer: Medicare Other | Admitting: *Deleted

## 2018-07-22 DIAGNOSIS — I5022 Chronic systolic (congestive) heart failure: Secondary | ICD-10-CM | POA: Diagnosis not present

## 2018-07-22 DIAGNOSIS — I429 Cardiomyopathy, unspecified: Secondary | ICD-10-CM

## 2018-07-22 LAB — CUP PACEART REMOTE DEVICE CHECK
Battery Remaining Longevity: 100 mo
Battery Voltage: 3.01 V
Brady Statistic AP VP Percent: 1.36 %
Brady Statistic AP VS Percent: 0.01 %
Brady Statistic AS VP Percent: 98.63 %
Brady Statistic AS VS Percent: 0 %
Brady Statistic RA Percent Paced: 1.37 %
Brady Statistic RV Percent Paced: 99.49 %
Date Time Interrogation Session: 20200618052402
HighPow Impedance: 59 Ohm
HighPow Impedance: 78 Ohm
Implantable Lead Implant Date: 20081014
Implantable Lead Implant Date: 20081014
Implantable Lead Implant Date: 20081014
Implantable Lead Location: 753858
Implantable Lead Location: 753859
Implantable Lead Location: 753860
Implantable Lead Model: 4542
Implantable Lead Model: 5076
Implantable Lead Model: 6947
Implantable Lead Serial Number: 128499
Implantable Pulse Generator Implant Date: 20191025
Lead Channel Impedance Value: 1064 Ohm
Lead Channel Impedance Value: 456 Ohm
Lead Channel Impedance Value: 475 Ohm
Lead Channel Impedance Value: 532 Ohm
Lead Channel Impedance Value: 589 Ohm
Lead Channel Impedance Value: 703 Ohm
Lead Channel Pacing Threshold Amplitude: 0.625 V
Lead Channel Pacing Threshold Amplitude: 0.625 V
Lead Channel Pacing Threshold Amplitude: 1 V
Lead Channel Pacing Threshold Pulse Width: 0.4 ms
Lead Channel Pacing Threshold Pulse Width: 0.4 ms
Lead Channel Pacing Threshold Pulse Width: 0.4 ms
Lead Channel Sensing Intrinsic Amplitude: 2.375 mV
Lead Channel Setting Pacing Amplitude: 1.25 V
Lead Channel Setting Pacing Amplitude: 1.5 V
Lead Channel Setting Pacing Amplitude: 2 V
Lead Channel Setting Pacing Pulse Width: 0.4 ms
Lead Channel Setting Pacing Pulse Width: 0.4 ms
Lead Channel Setting Sensing Sensitivity: 0.3 mV

## 2018-07-27 NOTE — Progress Notes (Signed)
Remote ICD transmission.   

## 2018-10-15 DIAGNOSIS — Z23 Encounter for immunization: Secondary | ICD-10-CM | POA: Diagnosis not present

## 2018-10-22 ENCOUNTER — Ambulatory Visit (INDEPENDENT_AMBULATORY_CARE_PROVIDER_SITE_OTHER): Payer: Medicare Other | Admitting: *Deleted

## 2018-10-22 DIAGNOSIS — I429 Cardiomyopathy, unspecified: Secondary | ICD-10-CM

## 2018-10-22 LAB — CUP PACEART REMOTE DEVICE CHECK
Battery Remaining Longevity: 95 mo
Battery Voltage: 3.01 V
Brady Statistic AP VP Percent: 0.65 %
Brady Statistic AP VS Percent: 0.01 %
Brady Statistic AS VP Percent: 99.33 %
Brady Statistic AS VS Percent: 0 %
Brady Statistic RA Percent Paced: 0.66 %
Brady Statistic RV Percent Paced: 99.74 %
Date Time Interrogation Session: 20200918052203
HighPow Impedance: 61 Ohm
HighPow Impedance: 76 Ohm
Implantable Lead Implant Date: 20081014
Implantable Lead Implant Date: 20081014
Implantable Lead Implant Date: 20081014
Implantable Lead Location: 753858
Implantable Lead Location: 753859
Implantable Lead Location: 753860
Implantable Lead Model: 4542
Implantable Lead Model: 5076
Implantable Lead Model: 6947
Implantable Lead Serial Number: 128499
Implantable Pulse Generator Implant Date: 20191025
Lead Channel Impedance Value: 1007 Ohm
Lead Channel Impedance Value: 418 Ohm
Lead Channel Impedance Value: 475 Ohm
Lead Channel Impedance Value: 532 Ohm
Lead Channel Impedance Value: 589 Ohm
Lead Channel Impedance Value: 646 Ohm
Lead Channel Pacing Threshold Amplitude: 0.625 V
Lead Channel Pacing Threshold Amplitude: 0.875 V
Lead Channel Pacing Threshold Amplitude: 0.875 V
Lead Channel Pacing Threshold Pulse Width: 0.4 ms
Lead Channel Pacing Threshold Pulse Width: 0.4 ms
Lead Channel Pacing Threshold Pulse Width: 0.4 ms
Lead Channel Sensing Intrinsic Amplitude: 1.5 mV
Lead Channel Sensing Intrinsic Amplitude: 1.5 mV
Lead Channel Sensing Intrinsic Amplitude: 18.625 mV
Lead Channel Setting Pacing Amplitude: 1.5 V
Lead Channel Setting Pacing Amplitude: 1.5 V
Lead Channel Setting Pacing Amplitude: 2 V
Lead Channel Setting Pacing Pulse Width: 0.4 ms
Lead Channel Setting Pacing Pulse Width: 0.4 ms
Lead Channel Setting Sensing Sensitivity: 0.3 mV

## 2018-10-25 ENCOUNTER — Encounter: Payer: Self-pay | Admitting: Cardiology

## 2018-10-25 NOTE — Progress Notes (Signed)
Remote ICD transmission.   

## 2019-01-07 ENCOUNTER — Encounter: Payer: Self-pay | Admitting: Internal Medicine

## 2019-01-07 ENCOUNTER — Other Ambulatory Visit: Payer: Self-pay | Admitting: *Deleted

## 2019-01-07 ENCOUNTER — Telehealth (INDEPENDENT_AMBULATORY_CARE_PROVIDER_SITE_OTHER): Payer: Medicare Other | Admitting: Internal Medicine

## 2019-01-07 VITALS — BP 122/82 | HR 69 | Ht 67.0 in | Wt 156.0 lb

## 2019-01-07 DIAGNOSIS — I10 Essential (primary) hypertension: Secondary | ICD-10-CM

## 2019-01-07 DIAGNOSIS — I110 Hypertensive heart disease with heart failure: Secondary | ICD-10-CM

## 2019-01-07 DIAGNOSIS — I5022 Chronic systolic (congestive) heart failure: Secondary | ICD-10-CM

## 2019-01-07 DIAGNOSIS — I2510 Atherosclerotic heart disease of native coronary artery without angina pectoris: Secondary | ICD-10-CM

## 2019-01-07 DIAGNOSIS — I447 Left bundle-branch block, unspecified: Secondary | ICD-10-CM

## 2019-01-07 NOTE — Progress Notes (Signed)
Electrophysiology TeleHealth Note  Due to national recommendations of social distancing due to [place] 19, an audio telehealth visit is felt to be most appropriate for this patient at this time.  Verbal consent was obtained by me for the telehealth visit today.  The patient does not have capability for a virtual visit.  A phone visit is therefore required today.   Date:  01/07/2019   ID:  Paul [name]., DOB 10/16/1940, MRN [id number]  Location: patient's home  Provider location:  [facility]  Evaluation Performed: Follow-up visit  PCP:  Paul Guile, DO   Electrophysiologist:  Dr Rayann Heman  Chief Complaint:  CHF  History of Present Illness:    Paul Balis. is a 78 y.o. male who presents via telehealth conferencing today.  Since last being seen in our clinic, the patient reports doing very well.  He had his ICD replaced at the [place] back in October.  He reports that he healed up well. Today, he denies symptoms of palpitations, chest pain, shortness of breath,  lower extremity edema, dizziness, presyncope, or syncope.  His primary concern is with pain in his ankle from prior hardware for a fracture.  The patient is otherwise without complaint today.  The patient denies symptoms of fevers, chills, cough, or new SOB worrisome for COVID 19.  Past Medical History:  Diagnosis Date  . Alcohol abuse    In the past. Stable over many years  . BPH (benign prostatic hyperplasia)   . CAD (coronary artery disease)    Distal circumflex and OM disease with collaterals 2008  . Cardiomyopathy    LVEF 25% up to 65%  . COPD (chronic obstructive pulmonary disease) ([place])   . Dyslipidemia   . Essential hypertension   . Gout   . ICD (implantable cardiac defibrillator) battery depletion    CRT-D device  . LBBB (left bundle branch block)   . Lung nodule    [facility]  . Migraines   . Mitral regurgitation    Severe before CRT with annular dilatation  . Smoking    Chantix cause  headaches  . Syncope    Probably orthostasis    Past Surgical History:  Procedure Laterality Date  . BI-VENTRICULAR IMPLANTABLE CARDIOVERTER DEFIBRILLATOR N/A 12/30/2011   Procedure: BI-VENTRICULAR IMPLANTABLE CARDIOVERTER DEFIBRILLATOR  (CRT-D);  Surgeon: Thompson Grayer, MD;  Location: [facility] CATH LAB;  Service: Cardiovascular;  Laterality: N/A;  . CARDIAC DEFIBRILLATOR PLACEMENT  12/29/12   BiV ICD implanted, generator change 12/30/11 by Dr Rayann Heman MDT Protecta XT CRT-D  . ORIF ANKLE FRACTURE Right 08/31/2014   Procedure: OPEN REDUCTION INTERNAL FIXATION (ORIF) ANKLE FRACTURE;  Surgeon: Marybelle Killings, MD;  Location: [place];  Service: Orthopedics;  Laterality: Right;    Current Outpatient Medications  Medication Sig Dispense Refill  . acetaminophen (TYLENOL) 650 MG CR tablet Take 1,300 mg by mouth 2 (two) times daily.    Marland Kitchen albuterol (PROVENTIL HFA;VENTOLIN HFA) 108 (90 Base) MCG/ACT inhaler Inhale 2 puffs into the lungs every 4 (four) hours as needed for wheezing or shortness of breath.    . Albuterol Sulfate 108 (90 Base) MCG/ACT AEPB Inhale into the lungs.    Marland Kitchen atorvastatin (LIPITOR) 80 MG tablet Take 80 mg by mouth daily.    . busPIRone (BUSPAR) 10 MG tablet Take 10 mg by mouth. Take 0.5 tablet (5 mg total) twice a day    . carbamide peroxide (DEBROX) 6.5 % otic solution Place 5 drops  into both ears daily as needed (for ear vwax removal).    . carvedilol (COREG) 25 MG tablet Take 12.5 mg by mouth 2 (two) times daily.    . cetirizine (ZYRTEC) 10 MG tablet Take 10 mg by mouth daily.    . Cholecalciferol (VITAMIN D3) 2000 units TABS Take 2 tablets by mouth daily.    . diclofenac sodium (VOLTAREN) 1 % GEL Apply 2 g topically as needed.    . divalproex (DEPAKOTE) 500 MG 24 hr tablet Take 1,500 mg by mouth at bedtime.      . finasteride (PROSCAR) 5 MG tablet Take 5 mg by mouth daily.      . folic acid (FOLVITE) 1 MG tablet Take 1 mg by mouth daily.      . furosemide (LASIX) 40 MG tablet Take 0.5  tablets (20 mg total) by mouth daily as needed for fluid or edema (weight gain > 3lbs). (Patient taking differently: Take 20 mg by mouth daily as needed for fluid or edema (weight gain > 3lbs). 06/05/16 [facility] 20 mg take 1 tablet by mouth as needed) 30 tablet   . gabapentin (NEURONTIN) 300 MG capsule Take 300-600 mg by mouth at bedtime.     . Glucosamine-Chondroitin (OSTEO BI-FLEX REGULAR STRENGTH PO) Take 1 tablet by mouth daily.    . hydroxypropyl methylcellulose (ISOPTO TEARS) 2.5 % ophthalmic solution Place 1 drop into both eyes 4 (four) times daily as needed for dry eyes.     Marland Kitchen loratadine (CLARITIN) 10 MG tablet Take 10 mg by mouth daily.    . Melatonin 10 MG TABS Take 1 tablet by mouth at bedtime.     . Multiple Vitamin (MULTIVITAMIN) tablet Take 1 tablet by mouth daily.      . nitroGLYCERIN (NITROSTAT) 0.4 MG SL tablet Place 0.4 mg under the tongue every 5 (five) minutes as needed.      . Omega-3 Fatty Acids (FISH OIL) 1000 MG CAPS Take 1 capsule by mouth daily.    Marland Kitchen omeprazole (PRILOSEC) 20 MG capsule Take 20 mg by mouth daily.      . potassium chloride SA (K-DUR,KLOR-CON) 20 MEQ tablet Take 1 tablet (20 mEq total) by mouth daily as needed (In the days when you take Lasix).    Marland Kitchen tiotropium (SPIRIVA) 18 MCG inhalation capsule Place 18 mcg into inhaler and inhale every morning.       No current facility-administered medications for this visit.     Allergies:   Codeine, Morphine sulfate, and Mupirocin   Social History:  The patient  reports that he has been smoking cigarettes and e-cigarettes. He started smoking about 62 years ago. He has a 22.50 pack-year smoking history. He has never used smokeless tobacco. He reports current alcohol use. He reports that he does not use drugs.   Family History:  The patient's family history includes Arthritis in his mother; Emphysema in his father; Lung cancer in his brother.   ROS:  Please see the history of present  illness.   All other systems are personally reviewed and negative.    Exam:    Vital Signs:  BP 122/82   Pulse 69   Ht 5\' 7"  (1.702 m)   Wt 156 lb (70.8 kg)   BMI 24.43 kg/m   Well sounding, alert and conversant   Labs/Other Tests and Data Reviewed:    Recent Labs: No results found for requested labs within last 8760 hours.   Wt Readings from Last 3  Encounters:  01/07/19 156 lb (70.8 kg)  05/21/18 159 lb (72.1 kg)  01/08/18 157 lb 12.8 oz (71.6 kg)     Last device remote is reviewed from [facility] PDF which reveals normal device function, no arrhythmias    ASSESSMENT & PLAN:    1.  Chronic systolic dysfunction/ LBBB Remotes are uptodate Normal device function No CHF symptoms He would like to resume ICM device clinic follow-up.  I have sent a message to Sharman Cheek.  2. HTN Stable No change required today  3. Tobacco Cessation advised  Follow-up:  Remotes Return in a year   Patient Risk:  after full review of this patients clinical status, I feel that they are at moderate risk at this time.  Today, I have spent 15 minutes with the patient with telehealth technology discussing arrhythmia management .    Army Fossa, MD  01/07/2019 11:08 AM     [facility] [address] [place] [address] [phone] (office) [phone] (fax)

## 2019-01-11 ENCOUNTER — Telehealth: Payer: Self-pay

## 2019-01-11 NOTE — Telephone Encounter (Signed)
Patient referred to [facility] clinic by Dr Rayann Heman for 2nd enrollment.  Spoke with patient and discussed option of monthly ICM follow up or device clinic follow up every 3 months. Patient previously opted out of ICM clinic due to monthly cost.  He reports he does not get a call after 3 month device clinic follow up and never knows if his fluid levels are up.  Advised device clinic will call him if the fluid levels are elevated at the time the transmission is received or he will receive a letter with next transmission date if fluid levels are normal.  Advised to monitor for fluid level symptoms between 3 month transmissions.  He said he prefers to continue with 3 month device clinic remote transmissions so he does not incur a monthly ICM cost. Advised to call device clinic if he has any questions.  Not enrolled in ICM clinic at this time due to patient declined.

## 2019-01-21 ENCOUNTER — Ambulatory Visit (INDEPENDENT_AMBULATORY_CARE_PROVIDER_SITE_OTHER): Payer: Medicare Other | Admitting: *Deleted

## 2019-01-21 DIAGNOSIS — I5022 Chronic systolic (congestive) heart failure: Secondary | ICD-10-CM

## 2019-01-21 LAB — CUP PACEART REMOTE DEVICE CHECK
Battery Remaining Longevity: 94 mo
Battery Voltage: 3.01 V
Brady Statistic AP VP Percent: 0.7 %
Brady Statistic AP VS Percent: 0.01 %
Brady Statistic AS VP Percent: 99.28 %
Brady Statistic AS VS Percent: 0.01 %
Brady Statistic RA Percent Paced: 0.71 %
Brady Statistic RV Percent Paced: 99.67 %
Date Time Interrogation Session: 20201218022602
HighPow Impedance: 56 Ohm
HighPow Impedance: 72 Ohm
Implantable Lead Implant Date: 20081014
Implantable Lead Implant Date: 20081014
Implantable Lead Implant Date: 20081014
Implantable Lead Location: 753858
Implantable Lead Location: 753859
Implantable Lead Location: 753860
Implantable Lead Model: 4542
Implantable Lead Model: 5076
Implantable Lead Model: 6947
Implantable Lead Serial Number: 128499
Implantable Pulse Generator Implant Date: 20191025
Lead Channel Impedance Value: 1064 Ohm
Lead Channel Impedance Value: 456 Ohm
Lead Channel Impedance Value: 475 Ohm
Lead Channel Impedance Value: 532 Ohm
Lead Channel Impedance Value: 589 Ohm
Lead Channel Impedance Value: 665 Ohm
Lead Channel Pacing Threshold Amplitude: 0.625 V
Lead Channel Pacing Threshold Amplitude: 0.75 V
Lead Channel Pacing Threshold Amplitude: 0.875 V
Lead Channel Pacing Threshold Pulse Width: 0.4 ms
Lead Channel Pacing Threshold Pulse Width: 0.4 ms
Lead Channel Pacing Threshold Pulse Width: 0.4 ms
Lead Channel Sensing Intrinsic Amplitude: 18.625 mV
Lead Channel Sensing Intrinsic Amplitude: 2 mV
Lead Channel Sensing Intrinsic Amplitude: 2 mV
Lead Channel Setting Pacing Amplitude: 1.25 V
Lead Channel Setting Pacing Amplitude: 1.5 V
Lead Channel Setting Pacing Amplitude: 2 V
Lead Channel Setting Pacing Pulse Width: 0.4 ms
Lead Channel Setting Pacing Pulse Width: 0.4 ms
Lead Channel Setting Sensing Sensitivity: 0.3 mV

## 2019-02-09 NOTE — Progress Notes (Signed)
ICD remote 

## 2019-04-22 ENCOUNTER — Ambulatory Visit (INDEPENDENT_AMBULATORY_CARE_PROVIDER_SITE_OTHER): Payer: Medicare Other | Admitting: *Deleted

## 2019-04-22 DIAGNOSIS — I5022 Chronic systolic (congestive) heart failure: Secondary | ICD-10-CM | POA: Diagnosis not present

## 2019-04-22 LAB — CUP PACEART REMOTE DEVICE CHECK
Battery Remaining Longevity: 92 mo
Battery Voltage: 3.01 V
Brady Statistic AP VP Percent: 0.3 %
Brady Statistic AP VS Percent: 0.01 %
Brady Statistic AS VP Percent: 99.69 %
Brady Statistic AS VS Percent: 0.01 %
Brady Statistic RA Percent Paced: 0.31 %
Brady Statistic RV Percent Paced: 99.9 %
Date Time Interrogation Session: 20210319012202
HighPow Impedance: 55 Ohm
HighPow Impedance: 78 Ohm
Implantable Lead Implant Date: 20081014
Implantable Lead Implant Date: 20081014
Implantable Lead Implant Date: 20081014
Implantable Lead Location: 753858
Implantable Lead Location: 753859
Implantable Lead Location: 753860
Implantable Lead Model: 4542
Implantable Lead Model: 5076
Implantable Lead Model: 6947
Implantable Lead Serial Number: 128499
Implantable Pulse Generator Implant Date: 20191025
Lead Channel Impedance Value: 1064 Ohm
Lead Channel Impedance Value: 475 Ohm
Lead Channel Impedance Value: 475 Ohm
Lead Channel Impedance Value: 589 Ohm
Lead Channel Impedance Value: 589 Ohm
Lead Channel Impedance Value: 703 Ohm
Lead Channel Pacing Threshold Amplitude: 0.625 V
Lead Channel Pacing Threshold Amplitude: 0.625 V
Lead Channel Pacing Threshold Amplitude: 1 V
Lead Channel Pacing Threshold Pulse Width: 0.4 ms
Lead Channel Pacing Threshold Pulse Width: 0.4 ms
Lead Channel Pacing Threshold Pulse Width: 0.4 ms
Lead Channel Sensing Intrinsic Amplitude: 1.875 mV
Lead Channel Sensing Intrinsic Amplitude: 1.875 mV
Lead Channel Sensing Intrinsic Amplitude: 18.625 mV
Lead Channel Setting Pacing Amplitude: 1.25 V
Lead Channel Setting Pacing Amplitude: 1.5 V
Lead Channel Setting Pacing Amplitude: 2 V
Lead Channel Setting Pacing Pulse Width: 0.4 ms
Lead Channel Setting Pacing Pulse Width: 0.4 ms
Lead Channel Setting Sensing Sensitivity: 0.3 mV

## 2019-04-22 NOTE — Progress Notes (Signed)
ICD remote 

## 2019-05-23 ENCOUNTER — Encounter: Payer: Self-pay | Admitting: Cardiology

## 2019-05-23 ENCOUNTER — Ambulatory Visit (INDEPENDENT_AMBULATORY_CARE_PROVIDER_SITE_OTHER): Payer: Medicare Other | Admitting: Cardiology

## 2019-05-23 ENCOUNTER — Other Ambulatory Visit: Payer: Self-pay

## 2019-05-23 VITALS — BP 100/60 | HR 54 | Ht 67.0 in | Wt 149.8 lb

## 2019-05-23 DIAGNOSIS — Z95810 Presence of automatic (implantable) cardiac defibrillator: Secondary | ICD-10-CM

## 2019-05-23 DIAGNOSIS — I2510 Atherosclerotic heart disease of native coronary artery without angina pectoris: Secondary | ICD-10-CM

## 2019-05-23 DIAGNOSIS — Z8679 Personal history of other diseases of the circulatory system: Secondary | ICD-10-CM | POA: Diagnosis not present

## 2019-05-23 NOTE — Progress Notes (Signed)
Cardiology Office Note  Date: 05/23/2019   ID: Paul [name]., DOB 1940-06-08, MRN [id number]  PCP:  Caralyn Guile, DO  Cardiologist:  Rozann Lesches, MD Electrophysiologist:  Thompson Grayer, MD   Chief Complaint  Patient presents with  . Cardiac follow-up    History of Present Illness: Paul [name]. is a 79 y.o. male last assessed via telehealth encounter in April 2020.  He presents for a routine visit.  He reports stable dyspnea on exertion, no chest pain, no palpitations or syncope. He has an automatic blood pressure cuff that he obtained through the [facility] system, he checks his pressures intermittently.  Recalls one occasion when it was elevated, but typically this is not the case.  He continues to follow with Dr. 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  Most recent device check showed normal function.  Last echocardiogram was in 2019 at which point LVEF was 50 to 55% with mild diastolic dysfunction.  We discussed getting an updated study.  Current medications include Coreg, Lipitor, and Lasix with potassium supplements on an as-needed basis.  He states that he has lost weight since last encounter, no orthopnea or PND.  No progressive leg swelling.  I personally reviewed his ECG today which shows a ventricular paced rhythm.  He states that he has had both doses of the coronavirus vaccine.  Past Medical History:  Diagnosis Date  . BPH (benign prostatic hyperplasia)   . CAD (coronary artery disease)    Distal circumflex and OM disease with collaterals 2008  . Cardiomyopathy    LVEF 25% up to 65%  . COPD (chronic obstructive pulmonary disease) ([place])   . Dyslipidemia   . Essential hypertension   . Gout   . History of alcohol abuse   . ICD (implantable cardiac defibrillator) battery depletion    CRT-D device  . LBBB (left bundle branch block)   . Lung nodule    [facility]  . Migraines   . Mitral regurgitation    Severe before CRT with annular  dilatation  . Orthostatic syncope     Past Surgical History:  Procedure Laterality Date  . BI-VENTRICULAR IMPLANTABLE CARDIOVERTER DEFIBRILLATOR N/A 12/30/2011   Procedure: BI-VENTRICULAR IMPLANTABLE CARDIOVERTER DEFIBRILLATOR  (CRT-D);  Surgeon: Thompson Grayer, MD;  Location: [facility] CATH LAB;  Service: Cardiovascular;  Laterality: N/A;  . CARDIAC DEFIBRILLATOR PLACEMENT  12/29/12   BiV ICD implanted, generator change 12/30/11 by Dr Rayann Heman MDT Protecta XT CRT-D  . ORIF ANKLE FRACTURE Right 08/31/2014   Procedure: OPEN REDUCTION INTERNAL FIXATION (ORIF) ANKLE FRACTURE;  Surgeon: Marybelle Killings, MD;  Location: [place];  Service: Orthopedics;  Laterality: Right;    Current Outpatient Medications  Medication Sig Dispense Refill  . acetaminophen (TYLENOL) 650 MG CR tablet Take 1,300 mg by mouth 2 (two) times daily.    Marland Kitchen albuterol (PROVENTIL HFA;VENTOLIN HFA) 108 (90 Base) MCG/ACT inhaler Inhale 2 puffs into the lungs every 4 (four) hours as needed for wheezing or shortness of breath.    . Albuterol Sulfate 108 (90 Base) MCG/ACT AEPB Inhale into the lungs.    Marland Kitchen atorvastatin (LIPITOR) 80 MG tablet Take 80 mg by mouth daily.    . busPIRone (BUSPAR) 10 MG tablet Take 10 mg by mouth. Take 0.5 tablet (5 mg total) twice a day    . carbamide peroxide (DEBROX) 6.5 % otic solution Place 5 drops into both ears daily as needed (for ear vwax removal).    Marland Kitchen  carvedilol (COREG) 25 MG tablet Take 12.5 mg by mouth 2 (two) times daily.    . cetirizine (ZYRTEC) 10 MG tablet Take 10 mg by mouth daily.    . Cholecalciferol (VITAMIN D3) 2000 units TABS Take 2 tablets by mouth daily.    . diclofenac sodium (VOLTAREN) 1 % GEL Apply 2 g topically as needed.    . divalproex (DEPAKOTE) 500 MG 24 hr tablet Take 1,500 mg by mouth at bedtime.      . finasteride (PROSCAR) 5 MG tablet Take 5 mg by mouth daily.      . folic acid (FOLVITE) 1 MG tablet Take 1 mg by mouth daily.      . furosemide (LASIX) 40 MG tablet Take 0.5 tablets (20  mg total) by mouth daily as needed for fluid or edema (weight gain > 3lbs). (Patient taking differently: Take 20 mg by mouth daily as needed for fluid or edema (weight gain > 3lbs). 06/05/16 [facility] F[facility]sem[facility] 20 mg take 1 tablet by mouth as needed) 30 tablet   . gabapentin (NEURONTIN) 300 MG capsule Take 300-600 mg by mouth at bedtime.     . Glucosamine-Chondroitin (OSTEO BI-FLEX REGULAR STRENGTH PO) Take 1 tablet by mouth daily.    . hydroxypropyl methylcellulose (ISOPTO TEARS) 2.5 % ophthalmic solution Place 1 drop into both eyes 4 (four) times daily as needed for dry eyes.     Marland Kitchen loratadine (CLARITIN) 10 MG tablet Take 10 mg by mouth daily.    . Melatonin 10 MG TABS Take 1 tablet by mouth at bedtime.     . Multiple Vitamin (MULTIVITAMIN) tablet Take 1 tablet by mouth daily.      . nitroGLYCERIN (NITROSTAT) 0.4 MG SL tablet Place 0.4 mg under the tongue every 5 (five) minutes as needed.      . Omega-3 Fatty Acids (FISH OIL) 1000 MG CAPS Take 1 capsule by mouth daily.    Marland Kitchen omeprazole (PRILOSEC) 20 MG capsule Take 20 mg by mouth daily.      . potassium chloride SA (K-DUR,KLOR-CON) 20 MEQ tablet Take 1 tablet (20 mEq total) by mouth daily as needed (In the days when you take Lasix).    Marland Kitchen tiotropium (SPIRIVA) 18 MCG inhalation capsule Place 18 mcg into inhaler and inhale every morning.       No current facility-administered medications for this visit.   Allergies:  Codeine, Morphine sulfate, and Mupirocin   ROS:   Hearing loss.  Physical Exam: VS:  BP 100/60   Pulse (!) 54   Ht 5\' 7"  (1.702 m)   Wt 149 lb 12.8 oz (67.9 kg)   SpO2 98%   BMI 23.46 kg/m , BMI Body mass index is 23.46 kg/m.  Wt Readings from Last 3 Encounters:  05/23/19 149 lb 12.8 oz (67.9 kg)  01/07/19 156 lb (70.8 kg)  05/21/18 159 lb (72.1 kg)    General:  Elderly male, appears comfortable at rest. HEENT: Conjunctiva and lids normal, wearing a mask. Neck: Supple, no elevated JVP or carotid  bruits, no thyromegaly. Lungs: Clear to auscultation, nonlabored breathing at rest. Cardiac: Regular rate and rhythm, no S3, soft systolic murmur, no pericardial rub. Abdomen: Soft, nontender, bowel sounds present. Extremities: No pitting edema, distal pulses 2+.  ECG:  An ECG dated 09/04/2017 was personally reviewed today and demonstrated:  Ventricular paced rhythm with atrial tracking.  Recent Labwork:  No interval lab work for review today.  Other Studies Reviewed Today:  Echocardiogram 06/11/2017: Study  Conclusions  - Left ventricle: The cavity size was normal. Wall thickness was normal. Systolic function was normal. The estimated ejection fraction was in the range of 50% to 55%. Wall motion was normal; there were no regional wall motion abnormalities. Doppler parameters are consistent with abnormal left ventricular relaxation (grade 1 diastolic dysfunction). - Aortic valve: Mildly calcified annulus. Mildly thickened leaflets. Valve area (VTI): 2.06 cm^2. Valve area (Vmax): 2.32 cm^2. Valve area (Vmean): 2.09 cm^2. - Technically adequate study.  Assessment and Plan:  1.  History of nonischemic cardiomyopathy with LVEF 50 to 55% by echocardiogram in 2019.  He is on Coreg at this time, has as needed Lasix with potassium supplements but rarely uses this.  We will obtain a follow-up echocardiogram.  2.  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  I reviewed his last office note by Dr. Rayann Heman, device function has been normal.  He does not report any shocks or syncope.  3.  Symptomatically stable CAD including distal circumflex and obtuse marginal disease with collaterals, managed medically.  He remains on Lipitor and has as needed nitroglycerin available.  Medication Adjustments/Labs and Tests Ordered: Current medicines are reviewed at length with the patient today.  Concerns regarding medicines are outlined above.   Tests  Ordered: Orders Placed This Encounter  Procedures  . EKG 12-Lead  . ECHOCARDIOGRAM COMPLETE    Medication Changes: No orders of the defined types were placed in this encounter.   Disposition:  Follow up 1 year in the [place] office.  Signed, Satira Sark, MD, [facility] 05/23/2019 1:25 PM    [facility] [place], [place], [place] [address] Phone: [phone]; Fax: [phone]

## 2019-05-23 NOTE — Patient Instructions (Addendum)
Medication Instructions:   Your physician recommends that you continue on your current medications as directed. Please refer to the Current Medication list given to you today.  Labwork:  NONE  Testing/Procedures: Your physician has requested that you have an echocardiogram. Echocardiography is a painless test that uses sound waves to create images of your heart. It provides your doctor with information about the size and shape of your heart and how well your heart's chambers and valves are working. This procedure takes approximately one hour. There are no restrictions for this procedure.  Follow-Up:  Your physician recommends that you schedule a follow-up appointment in: 1 year (office). You will receive a reminder letter in the mail in about 10 months reminding you to call and schedule your appointment. If you don't receive this letter, please contact our office.  Any Other Special Instructions Will Be Listed Below (If Applicable).  If you need a refill on your cardiac medications before your next appointment, please call your pharmacy. 

## 2019-05-25 ENCOUNTER — Other Ambulatory Visit: Payer: Self-pay | Admitting: Cardiology

## 2019-05-25 DIAGNOSIS — I2510 Atherosclerotic heart disease of native coronary artery without angina pectoris: Secondary | ICD-10-CM

## 2019-05-25 DIAGNOSIS — I429 Cardiomyopathy, unspecified: Secondary | ICD-10-CM

## 2019-06-15 ENCOUNTER — Ambulatory Visit (INDEPENDENT_AMBULATORY_CARE_PROVIDER_SITE_OTHER): Payer: Medicare Other

## 2019-06-15 ENCOUNTER — Other Ambulatory Visit: Payer: Self-pay

## 2019-06-15 DIAGNOSIS — I2510 Atherosclerotic heart disease of native coronary artery without angina pectoris: Secondary | ICD-10-CM | POA: Diagnosis not present

## 2019-06-15 DIAGNOSIS — I429 Cardiomyopathy, unspecified: Secondary | ICD-10-CM | POA: Diagnosis not present

## 2019-06-16 ENCOUNTER — Telehealth: Payer: Self-pay | Admitting: *Deleted

## 2019-06-16 NOTE — Telephone Encounter (Signed)
-----   Message from Satira Sark, MD sent at 06/15/2019  5:10 PM EDT ----- Results reviewed.  LVEF normal range at 55 to 60%.  Continue with current follow-up plan.

## 2019-06-16 NOTE — Telephone Encounter (Signed)
Patient informed. Copy sent to PCP °

## 2019-07-22 ENCOUNTER — Ambulatory Visit (INDEPENDENT_AMBULATORY_CARE_PROVIDER_SITE_OTHER): Payer: Medicare Other | Admitting: *Deleted

## 2019-07-22 DIAGNOSIS — I429 Cardiomyopathy, unspecified: Secondary | ICD-10-CM | POA: Diagnosis not present

## 2019-07-22 LAB — CUP PACEART REMOTE DEVICE CHECK
Battery Remaining Longevity: 88 mo
Battery Voltage: 3 V
Brady Statistic AP VP Percent: 0.87 %
Brady Statistic AP VS Percent: 0.01 %
Brady Statistic AS VP Percent: 99.11 %
Brady Statistic AS VS Percent: 0.01 %
Brady Statistic RA Percent Paced: 0.88 %
Brady Statistic RV Percent Paced: 99.75 %
Date Time Interrogation Session: 20210618022704
HighPow Impedance: 57 Ohm
HighPow Impedance: 77 Ohm
Implantable Lead Implant Date: 20081014
Implantable Lead Implant Date: 20081014
Implantable Lead Implant Date: 20081014
Implantable Lead Location: 753858
Implantable Lead Location: 753859
Implantable Lead Location: 753860
Implantable Lead Model: 4542
Implantable Lead Model: 5076
Implantable Lead Model: 6947
Implantable Lead Serial Number: 128499
Implantable Pulse Generator Implant Date: 20191025
Lead Channel Impedance Value: 1026 Ohm
Lead Channel Impedance Value: 456 Ohm
Lead Channel Impedance Value: 475 Ohm
Lead Channel Impedance Value: 532 Ohm
Lead Channel Impedance Value: 589 Ohm
Lead Channel Impedance Value: 665 Ohm
Lead Channel Pacing Threshold Amplitude: 0.625 V
Lead Channel Pacing Threshold Amplitude: 0.625 V
Lead Channel Pacing Threshold Amplitude: 0.75 V
Lead Channel Pacing Threshold Pulse Width: 0.4 ms
Lead Channel Pacing Threshold Pulse Width: 0.4 ms
Lead Channel Pacing Threshold Pulse Width: 0.4 ms
Lead Channel Sensing Intrinsic Amplitude: 1.75 mV
Lead Channel Sensing Intrinsic Amplitude: 1.75 mV
Lead Channel Sensing Intrinsic Amplitude: 18.625 mV
Lead Channel Setting Pacing Amplitude: 1.25 V
Lead Channel Setting Pacing Amplitude: 1.5 V
Lead Channel Setting Pacing Amplitude: 2 V
Lead Channel Setting Pacing Pulse Width: 0.4 ms
Lead Channel Setting Pacing Pulse Width: 0.4 ms
Lead Channel Setting Sensing Sensitivity: 0.3 mV

## 2019-07-25 NOTE — Progress Notes (Signed)
Remote ICD transmission.   

## 2019-08-25 ENCOUNTER — Encounter: Payer: Self-pay | Admitting: Internal Medicine

## 2019-09-05 ENCOUNTER — Encounter: Payer: Self-pay | Admitting: Internal Medicine

## 2019-09-20 ENCOUNTER — Telehealth: Payer: Self-pay | Admitting: Internal Medicine

## 2019-09-20 NOTE — Telephone Encounter (Signed)
Patient called requesting Dr. Jackalyn Lombard nurse give him a call concerning his CT report from the [facility]

## 2019-09-27 NOTE — Telephone Encounter (Signed)
Pt aware Otila Kluver, RN will follow up with pt this week  (Aware she was out unexpectedly last week)  He wanted to make sure that we got the CT results from the [facility].  He has "a spot in the upper part of my lung", and has consult w/ oncology 9/9.    Pt aware nurse will follow up with him about this.

## 2019-09-28 NOTE — Telephone Encounter (Signed)
Spoke with the patient and let him know that his CT scan was not in the computer system yet. However I will let Dr. Rayann Heman know what is going on and keep a check for when his scan is available.

## 2019-10-21 ENCOUNTER — Ambulatory Visit (INDEPENDENT_AMBULATORY_CARE_PROVIDER_SITE_OTHER): Payer: Medicare Other | Admitting: *Deleted

## 2019-10-21 DIAGNOSIS — I5022 Chronic systolic (congestive) heart failure: Secondary | ICD-10-CM

## 2019-10-21 LAB — CUP PACEART REMOTE DEVICE CHECK
Battery Remaining Longevity: 84 mo
Battery Voltage: 3 V
Brady Statistic AP VP Percent: 1.13 %
Brady Statistic AP VS Percent: 0.01 %
Brady Statistic AS VP Percent: 98.73 %
Brady Statistic AS VS Percent: 0.13 %
Brady Statistic RA Percent Paced: 1.14 %
Brady Statistic RV Percent Paced: 99.43 %
Date Time Interrogation Session: 20210917012502
HighPow Impedance: 55 Ohm
HighPow Impedance: 75 Ohm
Implantable Lead Implant Date: 20081014
Implantable Lead Implant Date: 20081014
Implantable Lead Implant Date: 20081014
Implantable Lead Location: 753858
Implantable Lead Location: 753859
Implantable Lead Location: 753860
Implantable Lead Model: 4542
Implantable Lead Model: 5076
Implantable Lead Model: 6947
Implantable Lead Serial Number: 128499
Implantable Pulse Generator Implant Date: 20191025
Lead Channel Impedance Value: 1007 Ohm
Lead Channel Impedance Value: 456 Ohm
Lead Channel Impedance Value: 513 Ohm
Lead Channel Impedance Value: 551 Ohm
Lead Channel Impedance Value: 589 Ohm
Lead Channel Impedance Value: 665 Ohm
Lead Channel Pacing Threshold Amplitude: 0.5 V
Lead Channel Pacing Threshold Amplitude: 0.875 V
Lead Channel Pacing Threshold Amplitude: 0.875 V
Lead Channel Pacing Threshold Pulse Width: 0.4 ms
Lead Channel Pacing Threshold Pulse Width: 0.4 ms
Lead Channel Pacing Threshold Pulse Width: 0.4 ms
Lead Channel Sensing Intrinsic Amplitude: 1.75 mV
Lead Channel Sensing Intrinsic Amplitude: 1.75 mV
Lead Channel Sensing Intrinsic Amplitude: 18.625 mV
Lead Channel Setting Pacing Amplitude: 1.5 V
Lead Channel Setting Pacing Amplitude: 1.5 V
Lead Channel Setting Pacing Amplitude: 2 V
Lead Channel Setting Pacing Pulse Width: 0.4 ms
Lead Channel Setting Pacing Pulse Width: 0.4 ms
Lead Channel Setting Sensing Sensitivity: 0.3 mV

## 2019-10-24 NOTE — Progress Notes (Signed)
Remote ICD transmission.   

## 2019-10-25 ENCOUNTER — Ambulatory Visit: Payer: Medicare Other | Admitting: Nurse Practitioner

## 2019-11-02 ENCOUNTER — Ambulatory Visit: Payer: Non-veteran care | Admitting: Nurse Practitioner

## 2019-11-02 NOTE — Progress Notes (Deleted)
[facility]  [facility] Gastroenterologist:  Dr. Abbey Chatters  No chief complaint on file.   HPI:   Paul [name]. is a 79 y.o. male who presents on referral from the [facility] for abnormal weight loss.  Reviewed information provided with referral including ***.  No history of colonoscopy or endoscopy in our system.  Today he states he is doing okay overall.  Past Medical History:  Diagnosis Date   BPH (benign prostatic hyperplasia)    CAD (coronary artery disease)    Distal circumflex and OM disease with collaterals 2008   Cardiomyopathy    LVEF 25% up to 65%   COPD (chronic obstructive pulmonary disease) (HCC)    Dyslipidemia    Essential hypertension    Gout    History of alcohol abuse    ICD (implantable cardiac defibrillator) battery depletion    CRT-D device   LBBB (left bundle branch block)    Lung nodule    [facility]   Migraines    Mitral regurgitation    Severe before CRT with annular dilatation   Orthostatic syncope     Past Surgical History:  Procedure Laterality Date   BI-VENTRICULAR IMPLANTABLE CARDIOVERTER DEFIBRILLATOR N/A 12/30/2011   Procedure: BI-VENTRICULAR IMPLANTABLE CARDIOVERTER DEFIBRILLATOR  (CRT-D);  Surgeon: Thompson Grayer, MD;  Location: [facility] CATH LAB;  Service: Cardiovascular;  Laterality: N/A;   CARDIAC DEFIBRILLATOR PLACEMENT  12/29/12   BiV ICD implanted, generator change 12/30/11 by Dr Rayann Heman MDT Protecta XT CRT-D   ORIF ANKLE FRACTURE Right 08/31/2014   Procedure: OPEN REDUCTION INTERNAL FIXATION (ORIF) ANKLE FRACTURE;  Surgeon: Marybelle Killings, MD;  Location: [facility];  Service: Orthopedics;  Laterality: Right;    Current Outpatient Medications  Medication Sig Dispense Refill   acetaminophen (TYLENOL) 650 MG CR tablet Take 1,300 mg by mouth 2 (two) times daily.     albuterol (PROVENTIL HFA;VENTOLIN HFA) 108 (90 Base) MCG/ACT inhaler Inhale 2 puffs into the lungs every 4 (four) hours  as needed for wheezing or shortness of breath.     Albuterol Sulfate 108 (90 Base) MCG/ACT AEPB Inhale into the lungs.     atorvastatin (LIPITOR) 80 MG tablet Take 80 mg by mouth daily.     busPIRone (BUSPAR) 10 MG tablet Take 10 mg by mouth. Take 0.5 tablet (5 mg total) twice a day     carbamide peroxide (DEBROX) 6.5 % otic solution Place 5 drops into both ears daily as needed (for ear vwax removal).     carvedilol (COREG) 25 MG tablet Take 12.5 mg by mouth 2 (two) times daily.     cetirizine (ZYRTEC) 10 MG tablet Take 10 mg by mouth daily.     Cholecalciferol (VITAMIN D3) 2000 units TABS Take 2 tablets by mouth daily.     diclofenac sodium (VOLTAREN) 1 % GEL Apply 2 g topically as needed.     divalproex (DEPAKOTE) 500 MG 24 hr tablet Take 1,500 mg by mouth at bedtime.       finasteride (PROSCAR) 5 MG tablet Take 5 mg by mouth daily.       folic acid (FOLVITE) 1 MG tablet Take 1 mg by mouth daily.       furosemide (LASIX) 40 MG tablet Take 0.5 tablets (20 mg total) by mouth daily as needed for fluid or edema (weight gain > 3lbs). (Patient taking differently: Take 20 mg by mouth daily as needed for fluid or edema (weight gain > 3lbs).  06/05/16 [facility] 20 mg take 1 tablet by mouth as needed) 30 tablet    gabapentin (NEURONTIN) 300 MG capsule Take 300-600 mg by mouth at bedtime.      Glucosamine-Chondroitin (OSTEO BI-FLEX REGULAR STRENGTH PO) Take 1 tablet by mouth daily.     hydroxypropyl methylcellulose (ISOPTO TEARS) 2.5 % ophthalmic solution Place 1 drop into both eyes 4 (four) times daily as needed for dry eyes.      loratadine (CLARITIN) 10 MG tablet Take 10 mg by mouth daily.     Melatonin 10 MG TABS Take 1 tablet by mouth at bedtime.      Multiple Vitamin (MULTIVITAMIN) tablet Take 1 tablet by mouth daily.       nitroGLYCERIN (NITROSTAT) 0.4 MG SL tablet Place 0.4 mg under the tongue every 5 (five) minutes as needed.       Omega-3 Fatty  Acids (FISH OIL) 1000 MG CAPS Take 1 capsule by mouth daily.     omeprazole (PRILOSEC) 20 MG capsule Take 20 mg by mouth daily.       potassium chloride SA (K-DUR,KLOR-CON) 20 MEQ tablet Take 1 tablet (20 mEq total) by mouth daily as needed (In the days when you take Lasix).     tiotropium (SPIRIVA) 18 MCG inhalation capsule Place 18 mcg into inhaler and inhale every morning.       No current facility-administered medications for this visit.    Allergies as of 11/02/2019 - Review Complete 05/23/2019  Allergen Reaction Noted   Codeine Other (See Comments) 05/30/2015   Morphine sulfate  10/14/2007   Mupirocin Rash 12/30/2011    Family History  Problem Relation Age of Onset   Arthritis Mother    Emphysema Father    Lung cancer Brother     Social History   Socioeconomic History   Marital status: Widowed    Spouse name: Not on file   Number of children: 4   Years of education: Not on file   Highest education level: Not on file  Occupational History   Occupation: RETIRED    Employer: RETIRED  Tobacco Use   Smoking status: Current Every Day Smoker    Packs/day: 0.50    Years: 45.00    Pack years: 22.50    Types: Cigarettes, E-cigarettes    Start date: 02/04/1956    Last attempt to quit: 07/15/2012    Years since quitting: 7.3   Smokeless tobacco: Never Used   Tobacco comment: he quit in June! 05/25/14 - tying to quit again   Vaping Use   Vaping Use: Some days  Substance and Sexual Activity   Alcohol use: Yes    Alcohol/week: 0.0 standard drinks    Comment: Occasionally   Drug use: No   Sexual activity: Yes  Other Topics Concern   Not on file  Social History Narrative   Not on file   Social Determinants of Health   Financial Resource Strain:    Difficulty of Paying Living Expenses: Not on file  Food Insecurity:    Worried About [place] in the Last Year: Not on file   Ran Out of Food in the Last Year: Not on file  Transportation  Needs:    Lack of Transportation (Medical): Not on file   Lack of Transportation (Non-Medical): Not on file  Physical Activity:    Days of Exercise per Week: Not on file   Minutes of Exercise per Session: Not on file  Stress:  Feeling of Stress : Not on file  Social Connections:    Frequency of Communication with Friends and Family: Not on file   Frequency of Social Gatherings with Friends and Family: Not on file   Attends Religious Services: Not on file   Active Member of Clubs or Organizations: Not on file   Attends Archivist Meetings: Not on file   Marital Status: Not on file  Intimate Partner Violence:    Fear of Current or Ex-Partner: Not on file   Emotionally Abused: Not on file   Physically Abused: Not on file   Sexually Abused: Not on file    Subjective: Review of Systems  Constitutional: Negative for chills, fever, malaise/fatigue and weight loss.  HENT: Negative for congestion and sore throat.   Respiratory: Negative for cough and shortness of breath.   Cardiovascular: Negative for chest pain and palpitations.  Gastrointestinal: Negative for abdominal pain, blood in stool, diarrhea, melena, nausea and vomiting.  Musculoskeletal: Negative for joint pain and myalgias.  Skin: Negative for rash.  Neurological: Negative for dizziness and weakness.  Endo/Heme/Allergies: Does not bruise/bleed easily.  Psychiatric/Behavioral: Negative for depression. The patient is not nervous/anxious.   All other systems reviewed and are negative.      Objective: There were no vitals taken for this visit. Physical Exam Vitals and nursing note reviewed.  Constitutional:      General: He is not in acute distress.    Appearance: Normal appearance. He is not ill-appearing, toxic-appearing or diaphoretic.  HENT:     Head: Normocephalic and atraumatic.     Nose: No congestion or rhinorrhea.  Eyes:     General: No scleral icterus. Cardiovascular:     Rate  and Rhythm: Normal rate and regular rhythm.     Heart sounds: Normal heart sounds.  Pulmonary:     Effort: Pulmonary effort is normal.     Breath sounds: Normal breath sounds.  Abdominal:     General: Bowel sounds are normal. There is no distension.     Palpations: Abdomen is soft. There is no hepatomegaly, splenomegaly or mass.     Tenderness: There is no abdominal tenderness. There is no guarding or rebound.     Hernia: No hernia is present.  Musculoskeletal:     Cervical back: Neck supple.  Skin:    General: Skin is warm and dry.     Coloration: Skin is not jaundiced.     Findings: No bruising or rash.  Neurological:     General: No focal deficit present.     Mental Status: He is alert and oriented to person, place, and time. Mental status is at baseline.  Psychiatric:        Mood and Affect: Mood normal.        Behavior: Behavior normal.        Thought Content: Thought content normal.      Assessment:  ***   Plan: ***    Thank you for allowing [place] to participate in the care of Yvonne M Luoma Jr.  Walden Field, DNP, AGNP-C Adult & Gerontological Nurse Practitioner [facility]   11/02/2019 1:17 PM   Disclaimer: This note was dictated with voice recognition software. Similar sounding words can inadvertently be transcribed and may not be corrected upon review.

## 2019-11-16 ENCOUNTER — Other Ambulatory Visit (HOSPITAL_COMMUNITY): Payer: Self-pay | Admitting: Student

## 2019-11-16 DIAGNOSIS — R911 Solitary pulmonary nodule: Secondary | ICD-10-CM

## 2019-11-22 ENCOUNTER — Encounter (HOSPITAL_COMMUNITY): Payer: Self-pay

## 2019-11-22 NOTE — Progress Notes (Unsigned)
HL 1  Paul [name] Care. Male, 79 y.o., 10-05-1940 MRN:  [id number] Phone:  [phone] (H) PCP:  [facility] Next Appt With Radiology ([facility]-CT 3) 12/01/2019 at 11:00 AM  RE: Biopsy Received: Today Message Details  Aletta Edouard, MD  [name] OK for CT guided biopsy of RLL lung nodule. Needs COVID test before procedure.   GY   Previous Messages  ----- Message -----  From: [name]  Sent: 11/22/2019 11:07 AM EDT  To: [name]  Subject: RE: Biopsy                    Pet uploaded Accession Number [id number]  ----- Message -----  From: [name], MD  Sent: 11/17/2019  9:00 AM EDT  To: [name]  Subject: RE: Biopsy                    No imaging available to review.  IDeally we would like to see a PET CT.   DDH    ----- Message -----  From: [name]  Sent: 11/16/2019  9:55 AM EDT  To: [name]  Subject: Biopsy                      Procedure Requested: IR Guided Biopsy of RLL Lesion    Reason for Procedure: pt with tobacco abuse presents with 1.8 cm nodule in RLL concerning for lung malignancy    Provider Requesting: [name]: Dr. [name]  Provider Telephone: [phone] ext [phone] or [phone] ext [phone]    Other Info:

## 2019-11-28 ENCOUNTER — Ambulatory Visit (HOSPITAL_COMMUNITY)
Admission: RE | Admit: 2019-11-28 | Discharge: 2019-11-28 | Disposition: A | Discharge status: Home / Self Care | Payer: No Typology Code available for payment source | Source: Ambulatory Visit | Attending: Internal Medicine | Admitting: Internal Medicine

## 2019-11-28 ENCOUNTER — Other Ambulatory Visit: Payer: Self-pay

## 2019-11-28 DIAGNOSIS — Z01812 Encounter for preprocedural laboratory examination: Secondary | ICD-10-CM | POA: Diagnosis not present

## 2019-11-28 DIAGNOSIS — Z20822 Contact with and (suspected) exposure to covid-19: Secondary | ICD-10-CM | POA: Insufficient documentation

## 2019-11-29 ENCOUNTER — Inpatient Hospital Stay
Admission: RE | Admit: 2019-11-29 | Discharge: 2019-11-29 | Disposition: A | Discharge status: Home / Self Care | Payer: Self-pay | Source: Ambulatory Visit | Attending: Diagnostic Radiology | Admitting: Diagnostic Radiology

## 2019-11-29 ENCOUNTER — Other Ambulatory Visit (HOSPITAL_COMMUNITY): Payer: Self-pay | Admitting: Diagnostic Radiology

## 2019-11-29 DIAGNOSIS — C801 Malignant (primary) neoplasm, unspecified: Secondary | ICD-10-CM

## 2019-11-29 LAB — SARS CORONAVIRUS 2 (TAT 6-24 HRS): SARS Coronavirus 2: NEGATIVE

## 2019-11-30 ENCOUNTER — Other Ambulatory Visit: Payer: Self-pay | Admitting: Radiology

## 2019-12-01 ENCOUNTER — Ambulatory Visit (HOSPITAL_COMMUNITY)
Admission: RE | Admit: 2019-12-01 | Discharge: 2019-12-01 | Disposition: A | Discharge status: Home / Self Care | Payer: Non-veteran care | Source: Ambulatory Visit | Attending: Student | Admitting: Student

## 2019-12-01 ENCOUNTER — Other Ambulatory Visit: Payer: Self-pay

## 2019-12-01 ENCOUNTER — Encounter (HOSPITAL_COMMUNITY): Payer: Self-pay

## 2019-12-01 DIAGNOSIS — R911 Solitary pulmonary nodule: Secondary | ICD-10-CM

## 2019-12-01 MED ORDER — SODIUM CHLORIDE 0.9 % IV SOLN: INTRAVENOUS | Status: DC

## 2019-12-01 NOTE — Progress Notes (Addendum)
    Pt scheduled today for Right lung mass biopsy  Referred from [facility] [phone] Ext [phone] Dr [name]  Pt lives independently [facility] in [place] for transportation and daily living needs Pt is alert/oriented Can state name and dob He is aware of place and time  He does have a Dtr in [place] must have not only transportation to and from appointment But must have someone to be in home with him for 24 hrs. He states he cannot arrange this today Transport is only for to and from hospital today No one can spend day and overnight with him. Dtr is unavailable per pt  We will have to reschedule this appointment Options: Pt to arrange with Dtr so she can come and be able to spend 24 hrs at his home after procedure.  Ordering MD consider admission for IP biopsy and DC next day.  I have left message at [phone] Ex [phone] to this effect. I also left IR scheduler number [phone]-- if we cam help in any way.  Discussed with Dr Anselm Pancoast  This note was sent to Dr Candiss Norse fax listed in [place]

## 2019-12-19 ENCOUNTER — Ambulatory Visit (HOSPITAL_COMMUNITY)
Admission: RE | Admit: 2019-12-19 | Discharge: 2019-12-19 | Disposition: A | Discharge status: Home / Self Care | Payer: Medicare Other | Source: Ambulatory Visit | Attending: Internal Medicine | Admitting: Internal Medicine

## 2019-12-19 ENCOUNTER — Other Ambulatory Visit: Payer: Self-pay

## 2019-12-19 DIAGNOSIS — Z20822 Contact with and (suspected) exposure to covid-19: Secondary | ICD-10-CM | POA: Diagnosis not present

## 2019-12-19 DIAGNOSIS — Z01812 Encounter for preprocedural laboratory examination: Secondary | ICD-10-CM | POA: Insufficient documentation

## 2019-12-19 LAB — SARS CORONAVIRUS 2 (TAT 6-24 HRS): SARS Coronavirus 2: NEGATIVE

## 2019-12-21 ENCOUNTER — Other Ambulatory Visit: Payer: Self-pay | Admitting: Student

## 2019-12-22 ENCOUNTER — Observation Stay (HOSPITAL_COMMUNITY)
Admission: RE | Admit: 2019-12-22 | Discharge: 2019-12-23 | Disposition: A | Discharge status: Home / Self Care | Payer: No Typology Code available for payment source | Source: Ambulatory Visit | Attending: Interventional Radiology | Admitting: Interventional Radiology

## 2019-12-22 ENCOUNTER — Ambulatory Visit (HOSPITAL_COMMUNITY): Payer: Non-veteran care

## 2019-12-22 ENCOUNTER — Other Ambulatory Visit: Payer: Self-pay

## 2019-12-22 ENCOUNTER — Observation Stay (HOSPITAL_COMMUNITY): Payer: No Typology Code available for payment source

## 2019-12-22 ENCOUNTER — Encounter (HOSPITAL_COMMUNITY): Payer: Self-pay

## 2019-12-22 DIAGNOSIS — C3431 Malignant neoplasm of lower lobe, right bronchus or lung: Principal | ICD-10-CM | POA: Insufficient documentation

## 2019-12-22 DIAGNOSIS — I2510 Atherosclerotic heart disease of native coronary artery without angina pectoris: Secondary | ICD-10-CM | POA: Diagnosis not present

## 2019-12-22 DIAGNOSIS — I119 Hypertensive heart disease without heart failure: Secondary | ICD-10-CM | POA: Insufficient documentation

## 2019-12-22 DIAGNOSIS — J449 Chronic obstructive pulmonary disease, unspecified: Secondary | ICD-10-CM | POA: Diagnosis not present

## 2019-12-22 DIAGNOSIS — Z885 Allergy status to narcotic agent status: Secondary | ICD-10-CM | POA: Insufficient documentation

## 2019-12-22 DIAGNOSIS — F17290 Nicotine dependence, other tobacco product, uncomplicated: Secondary | ICD-10-CM | POA: Insufficient documentation

## 2019-12-22 DIAGNOSIS — R911 Solitary pulmonary nodule: Secondary | ICD-10-CM | POA: Diagnosis not present

## 2019-12-22 DIAGNOSIS — Z79899 Other long term (current) drug therapy: Secondary | ICD-10-CM | POA: Insufficient documentation

## 2019-12-22 DIAGNOSIS — R918 Other nonspecific abnormal finding of lung field: Secondary | ICD-10-CM | POA: Diagnosis not present

## 2019-12-22 DIAGNOSIS — F17210 Nicotine dependence, cigarettes, uncomplicated: Secondary | ICD-10-CM | POA: Diagnosis not present

## 2019-12-22 LAB — CBC
HCT: 38.3 % — ABNORMAL LOW (ref 39.0–52.0)
Hemoglobin: 12.2 g/dL — ABNORMAL LOW (ref 13.0–17.0)
MCH: 29.9 pg (ref 26.0–34.0)
MCHC: 31.9 g/dL (ref 30.0–36.0)
MCV: 93.9 fL (ref 80.0–100.0)
Platelets: 199 10*3/uL (ref 150–400)
RBC: 4.08 MIL/uL — ABNORMAL LOW (ref 4.22–5.81)
RDW: 12.8 % (ref 11.5–15.5)
WBC: 6.3 10*3/uL (ref 4.0–10.5)
nRBC: 0 % (ref 0.0–0.2)

## 2019-12-22 LAB — APTT: aPTT: 32 seconds (ref 24–36)

## 2019-12-22 LAB — PROTIME-INR
INR: 1.1 (ref 0.8–1.2)
Prothrombin Time: 13.8 seconds (ref 11.4–15.2)

## 2019-12-22 MED ORDER — CARVEDILOL 12.5 MG PO TABS
12.5000 mg | ORAL_TABLET | Freq: Two times a day (BID) | ORAL | Status: DC
  Administered 2019-12-23: 12.5 mg via ORAL
  Filled 2019-12-22: qty 1

## 2019-12-22 MED ORDER — ALBUTEROL SULFATE HFA 108 (90 BASE) MCG/ACT IN AERS
2.0000 | INHALATION_SPRAY | RESPIRATORY_TRACT | Status: DC | PRN
  Filled 2019-12-22: qty 6.7

## 2019-12-22 MED ORDER — GABAPENTIN 300 MG PO CAPS
300.0000 mg | ORAL_CAPSULE | Freq: Every day | ORAL | Status: DC
  Administered 2019-12-22: 300 mg via ORAL
  Filled 2019-12-22: qty 1

## 2019-12-22 MED ORDER — FENTANYL CITRATE (PF) 100 MCG/2ML IJ SOLN
INTRAMUSCULAR | Status: AC | PRN
Start: 2019-12-22 — End: 2019-12-22
  Administered 2019-12-22 (×2): 50 ug via INTRAVENOUS

## 2019-12-22 MED ORDER — MIDAZOLAM HCL 2 MG/2ML IJ SOLN
INTRAMUSCULAR | Status: AC | PRN
  Administered 2019-12-22 (×2): 1 mg via INTRAVENOUS

## 2019-12-22 MED ORDER — FENTANYL CITRATE (PF) 100 MCG/2ML IJ SOLN
INTRAMUSCULAR | Status: AC
  Filled 2019-12-22: qty 2

## 2019-12-22 MED ORDER — FINASTERIDE 5 MG PO TABS
5.0000 mg | ORAL_TABLET | Freq: Every day | ORAL | Status: DC
  Administered 2019-12-22 – 2019-12-23 (×2): 5 mg via ORAL
  Filled 2019-12-22 (×2): qty 1

## 2019-12-22 MED ORDER — LIDOCAINE HCL 1 % IJ SOLN
INTRAMUSCULAR | Status: AC
  Filled 2019-12-22: qty 20

## 2019-12-22 MED ORDER — CARVEDILOL 12.5 MG PO TABS: 12.5000 mg | ORAL_TABLET | Freq: Two times a day (BID) | ORAL | Status: DC

## 2019-12-22 MED ORDER — DIVALPROEX SODIUM ER 500 MG PO TB24
1500.0000 mg | ORAL_TABLET | Freq: Every day | ORAL | Status: DC
  Administered 2019-12-22: 1500 mg via ORAL
  Filled 2019-12-22 (×2): qty 3

## 2019-12-22 MED ORDER — SODIUM CHLORIDE 0.9 % IV SOLN: INTRAVENOUS | Status: DC

## 2019-12-22 MED ORDER — UMECLIDINIUM BROMIDE 62.5 MCG/INH IN AEPB
1.0000 | INHALATION_SPRAY | Freq: Every day | RESPIRATORY_TRACT | Status: DC
  Filled 2019-12-22: qty 7

## 2019-12-22 MED ORDER — TIOTROPIUM BROMIDE MONOHYDRATE 18 MCG IN CAPS: 18.0000 ug | ORAL_CAPSULE | RESPIRATORY_TRACT | Status: DC

## 2019-12-22 MED ORDER — BUSPIRONE HCL 5 MG PO TABS
5.0000 mg | ORAL_TABLET | Freq: Two times a day (BID) | ORAL | Status: DC
  Administered 2019-12-22 – 2019-12-23 (×3): 5 mg via ORAL
  Filled 2019-12-22 (×3): qty 1

## 2019-12-22 MED ORDER — MIDAZOLAM HCL 2 MG/2ML IJ SOLN
INTRAMUSCULAR | Status: AC
  Filled 2019-12-22: qty 2

## 2019-12-22 NOTE — Discharge Instructions (Addendum)
Lung Biopsy, Care After This sheet gives you information about how to care for yourself after your procedure. Your health care provider may also give you more specific instructions depending on the type of biopsy you had. If you have problems or questions, contact your health care provider. What can I expect after the procedure? After the procedure, it is common to have:  A cough.  A sore throat.  Pain where a needle, bronchoscope, or incision was used to collect a biopsy sample (biopsy site). Follow these instructions at home: Medicines  Take over-the-counter and prescription medicines only as told by your health care provider.  Do not drink alcohol if your health care provider tells you not to drink.  Ask your health care provider if the medicine prescribed to you: ? Requires you to avoid driving or using heavy machinery. ? Can cause constipation. You may need to take these actions to prevent or treat constipation:  Drink enough fluid to keep your urine pale yellow.  Take over-the-counter or prescription medicines.  Eat foods that are high in fiber, such as beans, whole grains, and fresh fruits and vegetables.  Limit foods that are high in fat and processed sugars, such as fried or sweet foods.  Do not drive for 24 hours if you were given a sedative. Biopsy site care   Follow instructions from your health care provider about how to take care of your biopsy site. Make sure you: ? Wash your hands with soap and water before and after you change your bandage (dressing). If soap and water are not available, use hand sanitizer. ? Change your dressing as told by your health care provider. ? Leave stitches (sutures), skin glue, or adhesive strips in place. These skin closures may need to stay in place for 2 weeks or longer. If adhesive strip edges start to loosen and curl up, you may trim the loose edges. Do not remove adhesive strips completely unless your health care provider tells  you to do that.  Do not take baths, swim, or use a hot tub until your health care provider approves. Ask your health care provider if you may take showers. You may only be allowed to take sponge baths.  Check your biopsy site every day for signs of infection. Check for: ? Redness, swelling, or more pain. ? Fluid or blood. ? Warmth. ? Pus or a bad smell. General instructions  Return to your normal activities as told by your health care provider. Ask your health care provider what activities are safe for you.  It is up to you to get the results of your procedure. Ask your health care provider, or the department that is doing the procedure, when your results will be ready.  Keep all follow-up visits as told by your health care provider. This is important. Contact a health care provider if:  You have a fever.  You have redness, swelling, or more pain around your biopsy site.  You have fluid or blood coming from your biopsy site.  Your biopsy site feels warm to the touch.  You have pus or a bad smell coming from your biopsy site.  You have pain that does not get better with medicine. Get help right away if:  You cough up blood.  You have trouble breathing.  You have chest pain.  You lose consciousness. Summary  After the procedure, it is common to have a sore throat and a cough.  Return to your normal activities as told by your  health care provider. Ask your health care provider what activities are safe for you.  Take over-the-counter and prescription medicines only as told by your health care provider.  Report any unusual symptoms to your health care provider. This information is not intended to replace advice given to you by your health care provider. Make sure you discuss any questions you have with your health care provider. Document Revised: 02/24/2018 Document Reviewed: 02/19/2016 Elsevier Patient Education  [place]. Moderate Conscious Sedation,  Adult Sedation is the use of medicines to promote relaxation and relieve discomfort and anxiety. Moderate conscious sedation is a type of sedation. Under moderate conscious sedation, you are less alert than normal, but you are still able to respond to instructions, touch, or both. Moderate conscious sedation is used during short medical and dental procedures. It is milder than deep sedation, which is a type of sedation under which you cannot be easily woken up. It is also milder than general anesthesia, which is the use of medicines to make you unconscious. Moderate conscious sedation allows you to return to your regular activities sooner. Tell a health care provider about:  Any allergies you have.  All medicines you are taking, including vitamins, herbs, eye drops, creams, and over-the-counter medicines.  Use of steroids (by mouth or creams).  Any problems you or family members have had with sedatives and anesthetic medicines.  Any blood disorders you have.  Any surgeries you have had.  Any medical conditions you have, such as sleep apnea.  Whether you are pregnant or may be pregnant.  Any use of cigarettes, alcohol, marijuana, or street drugs. What are the risks? Generally, this is a safe procedure. However, problems may occur, including:  Getting too much medicine (oversedation).  Nausea.  Allergic reaction to medicines.  Trouble breathing. If this happens, a breathing tube may be used to help with breathing. It will be removed when you are awake and breathing on your own.  Heart trouble.  Lung trouble. What happens before the procedure? Staying hydrated Follow instructions from your health care provider about hydration, which may include:  Up to 2 hours before the procedure - you may continue to drink clear liquids, such as water, clear fruit juice, black coffee, and plain tea. Eating and drinking restrictions Follow instructions from your health care provider about  eating and drinking, which may include:  8 hours before the procedure - stop eating heavy meals or foods such as meat, fried foods, or fatty foods.  6 hours before the procedure - stop eating light meals or foods, such as toast or cereal.  6 hours before the procedure - stop drinking milk or drinks that contain milk.  2 hours before the procedure - stop drinking clear liquids. Medicine Ask your health care provider about:  Changing or stopping your regular medicines. This is especially important if you are taking diabetes medicines or blood thinners.  Taking medicines such as aspirin and ibuprofen. These medicines can thin your blood. Do not take these medicines before your procedure if your health care provider instructs you not to.  Tests and exams  You will have a physical exam.  You may have blood tests done to show: ? How well your kidneys and liver are working. ? How well your blood can clot. General instructions  Plan to have someone take you home from the hospital or clinic.  If you will be going home right after the procedure, plan to have someone with you for 24 hours.  What happens during the procedure?  An IV tube will be inserted into one of your veins.  Medicine to help you relax (sedative) will be given through the IV tube.  The medical or dental procedure will be performed. What happens after the procedure?  Your blood pressure, heart rate, breathing rate, and blood oxygen level will be monitored often until the medicines you were given have worn off.  Do not drive for 24 hours. This information is not intended to replace advice given to you by your health care provider. Make sure you discuss any questions you have with your health care provider. Document Revised: 01/02/2017 Document Reviewed: 05/12/2015 Elsevier Patient Education  2020 [employer].

## 2019-12-22 NOTE — H&P (Signed)
Chief Complaint: Patient was seen in consultation today for lung nodule biopsy   Referring Physician(s): Ronnie Doss  Supervising Physician: Daryll Brod  Patient Status: [facility] - Out-pt  History of Present Illness: Paul [name]. is a 79 y.o. male with a medical history significant for BPH, CAD, COPD, HTN. LBBB and tobacco abuse. The patient has a biventricular ICD in place. Recent CT imaging shows a 1.8 cm RLL nodule suspicious for malignancy.  Interventional Radiology has been asked to evaluate this patient for an image-guided right lower lobe nodule biopsy for further work up and evaluation. This case has been reviewed and procedure approved by Dr. Kathlene Cote. COVID test 12/19/19 is negative.   Past Medical History:  Diagnosis Date  . BPH (benign prostatic hyperplasia)   . CAD (coronary artery disease)    Distal circumflex and OM disease with collaterals 2008  . Cardiomyopathy    LVEF 25% up to 65%  . COPD (chronic obstructive pulmonary disease) ([place])   . Dyslipidemia   . Essential hypertension   . Gout   . History of alcohol abuse   . ICD (implantable cardiac defibrillator) battery depletion    CRT-D device  . LBBB (left bundle branch block)   . Lung nodule    [facility]  . Migraines   . Mitral regurgitation    Severe before CRT with annular dilatation  . Orthostatic syncope     Past Surgical History:  Procedure Laterality Date  . BI-VENTRICULAR IMPLANTABLE CARDIOVERTER DEFIBRILLATOR N/A 12/30/2011   Procedure: BI-VENTRICULAR IMPLANTABLE CARDIOVERTER DEFIBRILLATOR  (CRT-D);  Surgeon: Thompson Grayer, MD;  Location: [facility] CATH LAB;  Service: Cardiovascular;  Laterality: N/A;  . CARDIAC DEFIBRILLATOR PLACEMENT  12/29/12   BiV ICD implanted, generator change 12/30/11 by Dr Rayann Heman MDT Protecta XT CRT-D  . ORIF ANKLE FRACTURE Right 08/31/2014   Procedure: OPEN REDUCTION INTERNAL FIXATION (ORIF) ANKLE FRACTURE;  Surgeon: Marybelle Killings, MD;  Location: [place];   Service: Orthopedics;  Laterality: Right;    Allergies: Codeine, Morphine sulfate, and Mupirocin  Medications: Prior to Admission medications   Medication Sig Start Date End Date Taking? Authorizing Provider  acetaminophen (TYLENOL) 650 MG CR tablet Take 1,300 mg by mouth 2 (two) times daily.    [provider]  albuterol (PROVENTIL HFA;VENTOLIN HFA) 108 (90 Base) MCG/ACT inhaler Inhale 2 puffs into the lungs every 4 (four) hours as needed for wheezing or shortness of breath.    [provider]  Albuterol Sulfate 108 (90 Base) MCG/ACT AEPB Inhale into the lungs.    [provider]  atorvastatin (LIPITOR) 80 MG tablet Take 80 mg by mouth daily.    [provider]  busPIRone (BUSPAR) 10 MG tablet Take 10 mg by mouth. Take 0.5 tablet (5 mg total) twice a day    [provider]  carbamide peroxide (DEBROX) 6.5 % otic solution Place 5 drops into both ears daily as needed (for ear vwax removal).    [provider]  carvedilol (COREG) 25 MG tablet Take 12.5 mg by mouth 2 (two) times daily.    [provider]  cetirizine (ZYRTEC) 10 MG tablet Take 10 mg by mouth daily.    [provider]  Cholecalciferol (VITAMIN D3) 2000 units TABS Take 2 tablets by mouth daily.    [provider]  diclofenac sodium (VOLTAREN) 1 % GEL Apply 2 g topically as needed.    [provider]  divalproex (DEPAKOTE) 500 MG 24 hr tablet  Take 1,500 mg by mouth at bedtime.      [provider]  finasteride (PROSCAR) 5 MG tablet Take 5 mg by mouth daily.      [provider]  folic acid (FOLVITE) 1 MG tablet Take 1 mg by mouth daily.      [provider]  furosemide (LASIX) 40 MG tablet Take 0.5 tablets (20 mg total) by mouth daily as needed for fluid or edema (weight gain > 3lbs). Patient taking differently: Take 20 mg by mouth daily as needed for fluid or edema (weight gain > 3lbs). 06/05/16 [facility] Furosem[facility] 20 mg take 1 tablet by mouth as needed 09/03/14   Caren Griffins, MD  gabapentin (NEURONTIN) 300 MG capsule Take 300-600 mg by mouth at bedtime.     [provider]  Glucosamine-Chondroitin (OSTEO BI-FLEX REGULAR STRENGTH PO) Take 1 tablet by mouth daily.    [provider]  hydroxypropyl methylcellulose (ISOPTO TEARS) 2.5 % ophthalmic solution Place 1 drop into both eyes 4 (four) times daily as needed for dry eyes.     [provider]  loratadine (CLARITIN) 10 MG tablet Take 10 mg by mouth daily.    [provider]  Melatonin 10 MG TABS Take 1 tablet by mouth at bedtime.     [provider]  Multiple Vitamin (MULTIVITAMIN) tablet Take 1 tablet by mouth daily.      [provider]  nitroGLYCERIN (NITROSTAT) 0.4 MG SL tablet Place 0.4 mg under the tongue every 5 (five) minutes as needed.      [provider]  Omega-3 Fatty Acids (FISH OIL) 1000 MG CAPS Take 1 capsule by mouth daily.    [provider]  omeprazole (PRILOSEC) 20 MG capsule Take 20 mg by mouth daily.      [provider]  potassium chloride SA (K-DUR,KLOR-CON) 20 MEQ tablet Take 1 tablet (20 mEq total) by mouth daily as needed (In the days when you take Lasix). 09/03/14   Caren Griffins, MD  tiotropium (SPIRIVA) 18 MCG inhalation capsule Place 18 mcg into inhaler and inhale every morning.      [provider]     Family History  Problem Relation Age of Onset  . Arthritis Mother   . Emphysema Father   . Lung cancer Brother     Social History   Socioeconomic History  . Marital status: Widowed    Spouse name: Not on file  . Number of children: 4  . Years of education: Not on file  . Highest education level: Not on file  Occupational History  . Occupation: RETIRED    Employer: RETIRED  Tobacco Use  . Smoking status: Current Every Day Smoker    Packs/day: 0.50    Years: 45.00    Pack years: 22.50    Types:  Cigarettes, E-cigarettes    Start date: 02/04/1956    Last attempt to quit: 07/15/2012    Years since quitting: 7.4  . Smokeless tobacco: Never Used  . Tobacco comment: he quit in June! 05/25/14 - tying to quit again   Vaping Use  . Vaping Use: Some days  Substance and Sexual Activity  . Alcohol use: Yes    Alcohol/week: 0.0 standard drinks    Comment: Occasionally  . Drug use: No  . Sexual activity: Yes  Other Topics Concern  . Not on file  Social History Narrative  . Not on file   Social Determinants of Health  Financial Resource Strain:   . Difficulty of Paying Living Expenses: Not on file  Food Insecurity:   . Worried About Charity fundraiser in the Last Year: Not on file  . Ran Out of Food in the Last Year: Not on file  Transportation Needs:   . Lack of Transportation (Medical): Not on file  . Lack of Transportation (Non-Medical): Not on file  Physical Activity:   . Days of Exercise per Week: Not on file  . Minutes of Exercise per Session: Not on file  Stress:   . Feeling of Stress : Not on file  Social Connections:   . Frequency of Communication with Friends and Family: Not on file  . Frequency of Social Gatherings with Friends and Family: Not on file  . Attends Religious Services: Not on file  . Active Member of Clubs or Organizations: Not on file  . Attends Archivist Meetings: Not on file  . Marital Status: Not on file    Review of Systems: A 12 point ROS discussed and pertinent positives are indicated in the HPI above.  All other systems are negative.  Review of Systems  Constitutional: Negative for appetite change and fatigue.  HENT: Positive for dental problem.        Only two teeth on the bottom, one is loose.   Respiratory: Positive for shortness of breath. Negative for cough.        Occasional shortness of breath - hx of COPD  Cardiovascular: Negative for chest pain and leg swelling.  Gastrointestinal: Negative for abdominal pain, diarrhea,  nausea and vomiting.  Musculoskeletal: Negative for back pain.  Neurological: Negative for dizziness and headaches.  Hematological: Does not bruise/bleed easily.    Vital Signs: BP 111/73   Pulse 76   Temp 97.8 F (36.6 C) (Oral)   Resp 16   Ht 5\' 7"  (1.702 m)   Wt 154 lb (69.9 kg)   SpO2 100%   BMI 24.12 kg/m   Physical Exam Constitutional:      General: He is not in acute distress. HENT:     Mouth/Throat:     Mouth: Mucous membranes are moist.     Pharynx: Oropharynx is clear.     Comments: Only two teeth on the bottom, one is loose.  Cardiovascular:     Rate and Rhythm: Normal rate and regular rhythm.     Pulses: Normal pulses.     Heart sounds: Normal heart sounds.  Pulmonary:     Effort: Pulmonary effort is normal.     Breath sounds: Decreased air movement present.  Abdominal:     General: Bowel sounds are normal.     Palpations: Abdomen is soft.  Skin:    General: Skin is warm and dry.     Comments: Numerous fatty deposits bilateral arms.   Neurological:     Mental Status: He is alert and oriented to person, place, and time.     Imaging: No results found.  Labs:  CBC: No results for input(s): WBC, HGB, HCT, PLT in the last 8760 hours.  COAGS: No results for input(s): INR, APTT in the last 8760 hours.  BMP: No results for input(s): NA, K, CL, CO2, GLUCOSE, BUN, CALCIUM, CREATININE, GFRNONAA, GFRAA in the last 8760 hours.  Invalid input(s): CMP  LIVER FUNCTION TESTS: No results for input(s): BILITOT, AST, ALT, ALKPHOS, PROT, ALBUMIN in the last 8760 hours.  TUMOR MARKERS: No results for input(s): AFPTM, CEA, CA199, CHROMGRNA in the last  8760 hours.  Assessment and Plan:  Right lower lobe lung nodule: Davy M. Arville Care., 79 year old male, presents today to the [facility] Interventional Radiology department for an image-guided right lower lobe lung nodule biopsy. Mr. Latin does not have any family that can stay with him overnight and so the  patient will be admitted here at the hospital for 24-hour observation following this procedure.   Risks and benefits of CT guided lung nodule biopsy was discussed with the patient including, but not limited to bleeding, hemoptysis, respiratory failure requiring intubation, infection, pneumothorax requiring chest tube placement, stroke from air embolism or even death.  All of the patient's questions were answered and the patient is agreeable to proceed.  The patient has been NPO. Vitals have been reviewed. Labs are still pending but will be reviewed prior to the start of the procedure. He does not take any blood-thinning medications.   Consent signed and in chart.  Thank you for this interesting consult.  I greatly enjoyed meeting Lonzy M Will Jr. and look forward to participating in their care.  A copy of this report was sent to the requesting provider on this date.  Electronically Signed: Soyla Dryer, AGACNP-BC [phone] 12/22/2019, 11:23 AM   I spent a total of  30 Minutes   in face to face in clinical consultation, greater than 50% of which was counseling/coordinating care for image-guided lung nodule biopsy

## 2019-12-22 NOTE — Procedures (Signed)
Interventional Radiology Procedure Note  Procedure: CT BX RLL NODULE    Complications: None  Estimated Blood Loss:  MIN  Findings: 40 G CORE X3    M. Daryll Brod, MD

## 2019-12-23 DIAGNOSIS — C3431 Malignant neoplasm of lower lobe, right bronchus or lung: Secondary | ICD-10-CM | POA: Diagnosis not present

## 2019-12-23 LAB — SURGICAL PATHOLOGY

## 2019-12-23 NOTE — Discharge Summary (Signed)
Patient ID: Paul [name]. MRN: [id number] DOB/AGE: May 16, 1940 79 y.o.  Admit date: 26-Dec-2019 Discharge date: 12/23/2019  Supervising Physician: Markus Daft  Patient Status: [facility] - In-pt  Admission Diagnoses: Lung mass  Discharge Diagnoses:  Active Problems:   Lung nodule   Lung mass   Discharged Condition: good  Hospital Course: Paul [name] presented to IR on 2019/12/26 as an outpatient for an image guided right lower lobe lung mass biopsy with Dr. Annamaria Boots. The procedure was completed successfully without immediate complications. He was admitted for overnight observation as he did not have anyone at home who could care for him overnight following the biopsy. Per patient and staff he has had an uneventful hospital course.  Paul [name] laying in bed upon entry to room, arouse easily to verbal cues - he is heard of hearing. He tells me that he feels very good, no chest pain or trouble breathing. He ate some breakfast this morning but did not like the food very much so he plans to eat more once he leaves. He needs to use the restroom and is asking to remove his SCDs. He is ready to go home and has a ride who is ready to pick him up whenever he is ready. He has follow up with the [place] in [place] on 12/2.   Consults: None  Significant Diagnostic Studies: CT BIOPSY  Result Date: 26-Dec-2019 INDICATION: 1.8 cm right lower PET positive pulmonary nodule EXAM: CT-GUIDED BIOPSY RIGHT LOWER POSTERIOR NODULE MEDICATIONS: 1% lidocaine ANESTHESIA/SEDATION: 2.0 mg IV Versed; 100 mcg IV Fentanyl Moderate Sedation Time:  23 minutes The patient was continuously monitored during the procedure by the interventional radiology nurse under my direct supervision. PROCEDURE: The procedure, risks, benefits, and alternatives were explained to the patient. Questions regarding the procedure were encouraged and answered. The patient understands and consents to the procedure. Previous imaging reviewed. Patient  positioned prone. Noncontrast localization CT performed. The posterior right lower lobe nodule was localized and marked. Under sterile conditions and local anesthesia, a 17 gauge access needle was advanced from a posterior intercostal approach to the nodule. Needle position confirmed with CT. 1 cm 18 gauge core biopsy attempted. Sampling was small and fragmented. Samples placed in formalin. Postprocedure imaging demonstrates a small amount right lower lobe pulmonary hemorrhage from the biopsy. Needle tract occluded with the biosentry device. No large effusion or pneumothorax. Patient tolerated the procedure well without complication. Vital sign monitoring by nursing staff during the procedure will continue as patient is in the special procedures unit for post procedure observation. FINDINGS: The images document guide needle placement within the posterior right lower lobe nodule. Post biopsy images demonstrate biopsy related pulmonary hemorrhage. COMPLICATIONS: SIR Level A - No therapy, no consequence. IMPRESSION: Successful CT-guided core biopsy of the right lower lobe pulmonary nodule Electronically Signed   By: Jerilynn Mages.  Shick M.D.   On: 12-26-19 14:14   DG Chest Port 1 View  Result Date: 12/26/19 CLINICAL DATA:  Status post right lower lobe nodule biopsy EXAM: PORTABLE CHEST 1 VIEW COMPARISON:  12/26/2019, 08/30/2014 FINDINGS: Right midlung perihilar opacity correlates with pulmonary hemorrhage in the right lower lobe superior segment related to the nodule biopsy earlier today. No large effusion or pneumothorax. Lungs are hyperinflated compatible with background COPD/emphysema. Stable heart size and vascularity. Aorta atherosclerotic. Left subclavian AICD/pacemaker noted. IMPRESSION: Right midlung airspace opacity correlates with pulmonary hemorrhage related to the right lower lobe nodule biopsy. No significant effusion or pneumothorax. Electronically Signed   By: Jerilynn Mages.  Shick M.D.   On: 12/22/2019 14:32     Treatments: None  Discharge Exam: Blood pressure (!) 146/84, pulse 60, temperature 97.7 F (36.5 C), temperature source Oral, resp. rate 16, height 5\' 7"  (1.702 m), weight 154 lb (69.9 kg), SpO2 98 %. Physical Exam Vitals and nursing note reviewed.  Constitutional:      General: He is not in acute distress. HENT:     Head: Normocephalic.  Cardiovascular:     Rate and Rhythm: Normal rate and regular rhythm.  Pulmonary:     Effort: Pulmonary effort is normal.     Breath sounds: Normal breath sounds.     Comments: Room air Abdominal:     General: There is no distension.     Palpations: Abdomen is soft.     Tenderness: There is no abdominal tenderness.  Skin:    General: Skin is warm and dry.  Neurological:     Mental Status: He is alert and oriented to person, place, and time.  Psychiatric:        Mood and Affect: Mood normal.        Behavior: Behavior normal.        Thought Content: Thought content normal.        Judgment: Judgment normal.     Disposition: Discharge disposition: 01-Home or Self Care        Allergies as of 12/23/2019      Reactions   Codeine Other (See Comments)   Tremors    Morphine Sulfate Other (See Comments)   REACTION: internal hemorrage   Mupirocin Rash      Medication List    TAKE these medications   acetaminophen 500 MG tablet Commonly known as: TYLENOL Take 1,000 mg by mouth daily.   albuterol 108 (90 Base) MCG/ACT inhaler Commonly known as: VENTOLIN HFA Inhale 2 puffs into the lungs every 4 (four) hours as needed for wheezing or shortness of breath.   atorvastatin 80 MG tablet Commonly known as: LIPITOR Take 80 mg by mouth daily.   busPIRone 10 MG tablet Commonly known as: BUSPAR Take 5 mg by mouth 2 (two) times daily.   carbamide peroxide 6.5 % OTIC solution Commonly known as: DEBROX Place 5 drops into both ears daily as needed (for ear vwax removal).   carvedilol 25 MG tablet Commonly known as: COREG Take 12.5  mg by mouth 2 (two) times daily.   cetirizine 10 MG tablet Commonly known as: ZYRTEC Take 10 mg by mouth daily.   diclofenac sodium 1 % Gel Commonly known as: VOLTAREN Apply 2 g topically 4 (four) times daily as needed (painful joints).   divalproex 500 MG 24 hr tablet Commonly known as: DEPAKOTE ER Take 1,500 mg by mouth at bedtime.   finasteride 5 MG tablet Commonly known as: PROSCAR Take 5 mg by mouth daily.   Fish Oil 1000 MG Caps Take 1 capsule by mouth daily.   folic acid 1 MG tablet Commonly known as: FOLVITE Take 1 mg by mouth daily.   furosemide 40 MG tablet Commonly known as: LASIX Take 0.5 tablets (20 mg total) by mouth daily as needed for fluid or edema (weight gain > 3lbs).   gabapentin 300 MG capsule Commonly known as: NEURONTIN Take 300-600 mg by mouth at bedtime.   hydroxypropyl methylcellulose / hypromellose 2.5 % ophthalmic solution Commonly known as: ISOPTO TEARS / GONIOVISC Place 1 drop into both eyes 4 (four) times daily as needed for dry eyes.   Melatonin 10 MG  Tabs Take 1 tablet by mouth at bedtime.   multivitamin tablet Take 1 tablet by mouth daily.   nitroGLYCERIN 0.4 MG SL tablet Commonly known as: NITROSTAT Place 0.4 mg under the tongue every 5 (five) minutes as needed.   omeprazole 20 MG capsule Commonly known as: PRILOSEC Take 20 mg by mouth daily.   OSTEO BI-FLEX JOINT SHIELD PO Take 2 tablets by mouth daily.   potassium chloride SA 20 MEQ tablet Commonly known as: KLOR-CON Take 1 tablet (20 mEq total) by mouth daily as needed (In the days when you take Lasix).   Stiolto Respimat 2.5-2.5 MCG/ACT Aers Generic drug: Tiotropium Bromide-Olodaterol Inhale 2 puffs into the lungs daily.   Vitamin D3 50 MCG (2000 UT) Tabs Take 2 tablets by mouth daily.         Electronically Signed: Joaquim Nam, PA-C 12/23/2019, 9:22 AM   I have spent Less Than 30 Minutes discharging Paul [name] Kitchen

## 2019-12-23 NOTE — Plan of Care (Signed)
  Problem: Coping: Goal: Level of anxiety will decrease Outcome: Progressing   Problem: Pain Managment: Goal: General experience of comfort will improve Outcome: Progressing   Problem: Safety: Goal: Ability to remain free from injury will improve Outcome: Progressing   

## 2020-01-06 ENCOUNTER — Encounter: Payer: Medicare Other | Admitting: Internal Medicine

## 2020-01-20 ENCOUNTER — Ambulatory Visit (INDEPENDENT_AMBULATORY_CARE_PROVIDER_SITE_OTHER): Payer: No Typology Code available for payment source

## 2020-01-20 ENCOUNTER — Ambulatory Visit (INDEPENDENT_AMBULATORY_CARE_PROVIDER_SITE_OTHER): Payer: No Typology Code available for payment source | Admitting: Internal Medicine

## 2020-01-20 ENCOUNTER — Encounter: Payer: Self-pay | Admitting: Internal Medicine

## 2020-01-20 VITALS — BP 116/80 | HR 71 | Resp 16 | Ht 67.0 in | Wt 158.8 lb

## 2020-01-20 DIAGNOSIS — I447 Left bundle-branch block, unspecified: Secondary | ICD-10-CM

## 2020-01-20 DIAGNOSIS — I5022 Chronic systolic (congestive) heart failure: Secondary | ICD-10-CM

## 2020-01-20 DIAGNOSIS — I2510 Atherosclerotic heart disease of native coronary artery without angina pectoris: Secondary | ICD-10-CM

## 2020-01-20 DIAGNOSIS — I10 Essential (primary) hypertension: Secondary | ICD-10-CM | POA: Diagnosis not present

## 2020-01-20 LAB — CUP PACEART REMOTE DEVICE CHECK
Battery Remaining Longevity: 82 mo
Battery Voltage: 3 V
Brady Statistic AP VP Percent: 2.96 %
Brady Statistic AP VS Percent: 0.01 %
Brady Statistic AS VP Percent: 97 %
Brady Statistic AS VS Percent: 0.04 %
Brady Statistic RA Percent Paced: 2.94 %
Brady Statistic RV Percent Paced: 99.13 %
Date Time Interrogation Session: 20211217022602
HighPow Impedance: 48 Ohm
HighPow Impedance: 65 Ohm
Implantable Lead Implant Date: 20081014
Implantable Lead Implant Date: 20081014
Implantable Lead Implant Date: 20081014
Implantable Lead Location: 753858
Implantable Lead Location: 753859
Implantable Lead Location: 753860
Implantable Lead Model: 4542
Implantable Lead Model: 5076
Implantable Lead Model: 6947
Implantable Lead Serial Number: 128499
Implantable Pulse Generator Implant Date: 20191025
Lead Channel Impedance Value: 399 Ohm
Lead Channel Impedance Value: 475 Ohm
Lead Channel Impedance Value: 551 Ohm
Lead Channel Impedance Value: 589 Ohm
Lead Channel Impedance Value: 589 Ohm
Lead Channel Impedance Value: 988 Ohm
Lead Channel Pacing Threshold Amplitude: 0.625 V
Lead Channel Pacing Threshold Amplitude: 0.625 V
Lead Channel Pacing Threshold Amplitude: 0.875 V
Lead Channel Pacing Threshold Pulse Width: 0.4 ms
Lead Channel Pacing Threshold Pulse Width: 0.4 ms
Lead Channel Pacing Threshold Pulse Width: 0.4 ms
Lead Channel Sensing Intrinsic Amplitude: 1.625 mV
Lead Channel Sensing Intrinsic Amplitude: 1.625 mV
Lead Channel Sensing Intrinsic Amplitude: 18.625 mV
Lead Channel Setting Pacing Amplitude: 1.25 V
Lead Channel Setting Pacing Amplitude: 1.5 V
Lead Channel Setting Pacing Amplitude: 2 V
Lead Channel Setting Pacing Pulse Width: 0.4 ms
Lead Channel Setting Pacing Pulse Width: 0.4 ms
Lead Channel Setting Sensing Sensitivity: 0.3 mV

## 2020-01-20 NOTE — Progress Notes (Signed)
[facility] Cardiologist: Dr Domenic Polite Primary EP: Dr Rayann Heman  [name]. is a 79 y.o. male who presents today for routine electrophysiology followup.  Since last being seen in our clinic, the patient reports doing reasonably well.  He has stage I lung CA.  He is being evaluated for treatment.  Today, he denies symptoms of palpitations, chest pain, shortness of breath,  lower extremity edema, dizziness, presyncope, syncope, or ICD shocks.  The patient is otherwise without complaint today.   Past Medical History:  Diagnosis Date  . BPH (benign prostatic hyperplasia)   . CAD (coronary artery disease)    Distal circumflex and OM disease with collaterals 2008  . Cardiomyopathy    LVEF 25% up to 65%  . COPD (chronic obstructive pulmonary disease) ([place])   . Dyslipidemia   . Essential hypertension   . Gout   . History of alcohol abuse   . ICD (implantable cardiac defibrillator) battery depletion    CRT-D device  . LBBB (left bundle branch block)   . Lung nodule    [facility]  . Migraines   . Mitral regurgitation    Severe before CRT with annular dilatation  . Orthostatic syncope    Past Surgical History:  Procedure Laterality Date  . BI-VENTRICULAR IMPLANTABLE CARDIOVERTER DEFIBRILLATOR N/A 12/30/2011   Procedure: BI-VENTRICULAR IMPLANTABLE CARDIOVERTER DEFIBRILLATOR  (CRT-D);  Surgeon: Thompson Grayer, MD;  Location: [facility] CATH LAB;  Service: Cardiovascular;  Laterality: N/A;  . CARDIAC DEFIBRILLATOR PLACEMENT  12/29/12   BiV ICD implanted, generator change 12/30/11 by Dr Rayann Heman MDT Protecta XT CRT-D  . ORIF ANKLE FRACTURE Right 08/31/2014   Procedure: OPEN REDUCTION INTERNAL FIXATION (ORIF) ANKLE FRACTURE;  Surgeon: Marybelle Killings, MD;  Location: [place];  Service: Orthopedics;  Laterality: Right;    ROS- all systems are reviewed and negative except as per HPI above  Current Outpatient Medications  Medication Sig Dispense Refill  . acetaminophen  (TYLENOL) 500 MG tablet Take 1,000 mg by mouth daily.    Marland Kitchen albuterol (PROVENTIL HFA;VENTOLIN HFA) 108 (90 Base) MCG/ACT inhaler Inhale 2 puffs into the lungs every 4 (four) hours as needed for wheezing or shortness of breath.    Marland Kitchen atorvastatin (LIPITOR) 80 MG tablet Take 80 mg by mouth daily.    . busPIRone (BUSPAR) 10 MG tablet Take 5 mg by mouth 2 (two) times daily.     . carbamide peroxide (DEBROX) 6.5 % otic solution Place 5 drops into both ears daily as needed (for ear vwax removal).    . carvedilol (COREG) 25 MG tablet Take 12.5 mg by mouth 2 (two) times daily.    . cetirizine (ZYRTEC) 10 MG tablet Take 10 mg by mouth daily.    . Cholecalciferol (VITAMIN D3) 2000 units TABS Take 2 tablets by mouth daily.    . diclofenac sodium (VOLTAREN) 1 % GEL Apply 2 g topically 4 (four) times daily as needed (painful joints).     . divalproex (DEPAKOTE) 500 MG 24 hr tablet Take 1,500 mg by mouth at bedtime.    . finasteride (PROSCAR) 5 MG tablet Take 5 mg by mouth daily.    . folic acid (FOLVITE) 1 MG tablet Take 1 mg by mouth daily.    . furosemide (LASIX) 40 MG tablet Take 0.5 tablets (20 mg total) by mouth daily as needed for fluid or edema (weight gain > 3lbs). 30 tablet   . gabapentin (NEURONTIN) 300 MG capsule Take 300-600 mg  by mouth at bedtime.     . hydroxypropyl methylcellulose (ISOPTO TEARS) 2.5 % ophthalmic solution Place 1 drop into both eyes 4 (four) times daily as needed for dry eyes.    . Melatonin 10 MG TABS Take 1 tablet by mouth at bedtime.     . Misc Natural Products (OSTEO BI-FLEX JOINT SHIELD PO) Take 2 tablets by mouth daily.    . Multiple Vitamin (MULTIVITAMIN) tablet Take 1 tablet by mouth daily.    . nitroGLYCERIN (NITROSTAT) 0.4 MG SL tablet Place 0.4 mg under the tongue every 5 (five) minutes as needed.    . Omega-3 Fatty Acids (FISH OIL) 1000 MG CAPS Take 1 capsule by mouth daily.    Marland Kitchen omeprazole (PRILOSEC) 20 MG capsule Take 20 mg by mouth daily.    . potassium chloride SA  (K-DUR,KLOR-CON) 20 MEQ tablet Take 1 tablet (20 mEq total) by mouth daily as needed (In the days when you take Lasix).    . Tiotropium Bromide-Olodaterol (STIOLTO RESPIMAT) 2.5-2.5 MCG/ACT AERS Inhale 2 puffs into the lungs daily.     No current facility-administered medications for this visit.    Physical Exam: Vitals:   01/20/20 0916  BP: 116/80  Pulse: 71  Resp: 16  SpO2: 99%  Weight: 158 lb 12.8 oz (72 kg)  Height: 5\' 7"  (1.702 m)    GEN- The patient is well appearing, alert and oriented x 3 today.   Head- normocephalic, atraumatic Eyes-  Sclera clear, conjunctiva pink Ears- hearing intact Oropharynx- clear Lungs- Clear to ausculation bilaterally, normal work of breathing Chest- ICD pocket is well healed Heart- Regular rate and rhythm, no murmurs, rubs or gallops, PMI not laterally displaced GI- soft, NT, ND, + BS Extremities- no clubbing, cyanosis, or edema  ICD interrogation- reviewed in detail today,  See PACEART report  ekg tracing ordered today is personally reviewed and shows sinus with BiV pacing  Wt Readings from Last 3 Encounters:  01/20/20 158 lb 12.8 oz (72 kg)  12/22/19 154 lb (69.9 kg)  12/01/19 154 lb (69.9 kg)    Assessment and Plan:  1.  Chronic systolic dysfunction euvolemic today EF has normalized with CRT Stable on an appropriate medical regimen Normal ICD function See [facility] report No changes today he is not device dependant today not followed in ICM device clinic  2. HTN Stable No change required today  3. Tobacco Cessation advised  Risks, benefits and potential toxicities for medications prescribed and/or refilled reviewed with patient today.   Thompson Grayer MD, [facility] 01/20/2020 9:25 AM

## 2020-01-20 NOTE — Addendum Note (Signed)
Addended by: Merlene Laughter on: 01/20/2020 10:04 AM   Modules accepted: Orders

## 2020-01-20 NOTE — Patient Instructions (Addendum)
Medication Instructions:   Your physician recommends that you continue on your current medications as directed. Please refer to the Current Medication list given to you today.  Labwork:  None  Testing/Procedures:  None  Follow-Up:  Your physician recommends that you schedule a follow-up appointment in: 1 year.  Any Other Special Instructions Will Be Listed Below (If Applicable).  If you need a refill on your cardiac medications before your next appointment, please call your pharmacy. 

## 2020-02-01 DIAGNOSIS — Z23 Encounter for immunization: Secondary | ICD-10-CM | POA: Diagnosis not present

## 2020-02-02 ENCOUNTER — Telehealth: Payer: Self-pay | Admitting: Internal Medicine

## 2020-02-02 NOTE — Telephone Encounter (Signed)
Appointment yesterday at the cancer center in [place] for a radiation consult. In January their is a consult appointment with a surgeon and he feels this is the way he thinks he will want to proceed. Advised patient to have surgeon office send in pre op clearance forms to our office.  Patient requested to make Dr. Rayann Heman aware.

## 2020-02-02 NOTE — Telephone Encounter (Signed)
New Message:      Pt said he would like to talk to Dr Rayann Heman or his nurse. He said this is concerning his situation with his Cancer.

## 2020-02-02 NOTE — Progress Notes (Signed)
Remote ICD transmission.   

## 2020-02-14 ENCOUNTER — Encounter: Payer: Medicare Other | Admitting: Thoracic Surgery (Cardiothoracic Vascular Surgery)

## 2020-03-06 ENCOUNTER — Encounter: Payer: Self-pay | Admitting: Thoracic Surgery (Cardiothoracic Vascular Surgery)

## 2020-03-06 ENCOUNTER — Other Ambulatory Visit: Payer: Self-pay

## 2020-03-06 ENCOUNTER — Ambulatory Visit (INDEPENDENT_AMBULATORY_CARE_PROVIDER_SITE_OTHER): Payer: No Typology Code available for payment source | Admitting: Thoracic Surgery (Cardiothoracic Vascular Surgery)

## 2020-03-06 ENCOUNTER — Other Ambulatory Visit: Payer: Self-pay | Admitting: Thoracic Surgery (Cardiothoracic Vascular Surgery)

## 2020-03-06 VITALS — BP 170/112 | HR 103 | Temp 97.3°F | Resp 20 | Ht 67.0 in | Wt 164.0 lb

## 2020-03-06 DIAGNOSIS — R911 Solitary pulmonary nodule: Secondary | ICD-10-CM

## 2020-03-06 NOTE — Progress Notes (Signed)
PCP is [facility] Referring Provider is Ronnie Doss, MD  Chief Complaint  Patient presents with  . Lung Lesion    New patient consultation CT Bx 11/18, PET 10/16, CT chest 8/16    HPI: Paul [name] is sent for consultation regarding a squamous cell carcinoma of the right lower lobe.  Paul [name] is a 80 year old man with a complicated past medical history including tobacco abuse, COPD, cardiomyopathy, CAD, ICD, left bundle branch block, mitral regurgitation, orthostatic syncope, hypertension, hyperlipidemia, and gout.  He recently was found to have a right lower lobe lung nodule.  He has a 60-pack-year history of tobacco abuse prior to quitting 3 months ago.  He had a CT of the chest at the [place] on 09/19/2019.  It showed a 1.8 x 1.4 x 1.5 cm right lower lobe lung nodule.  There also was a 7 mm subpleural nodule in the lingula.  There was no evidence of hilar or mediastinal adenopathy.  He had a PET/CT which showed mild uptake in the nodule with an SUV of 2.0.  There was no evidence of regional or distant metastatic disease.  He had a CT-guided needle biopsy on 12/22/2019 which showed squamous cell carcinoma.  He saw a radiation oncology, Dr. Adella Nissen in [place] on 02/01/2020.  He discussed the possibility of radiation.  Paul [name] was interested in possible surgical resection.  He had an appointment with me few weeks ago that was canceled due to weather.  He has transportation issues and today was the first day he could return.  He is not a good historian.  He denies chest pain, pressure, tightness, or shortness of breath.  He says that he can walk 100 yards down to his mailbox and back without stopping.  He is not aware of having any pulmonary function testing done. Past Medical History:  Diagnosis Date  . BPH (benign prostatic hyperplasia)   . CAD (coronary artery disease)    Distal circumflex and OM disease with collaterals 2008  . Cardiomyopathy    LVEF 25% up to 65%  . COPD (chronic  obstructive pulmonary disease) ([place])   . Dyslipidemia   . Essential hypertension   . Gout   . History of alcohol abuse   . ICD (implantable cardiac defibrillator) battery depletion    CRT-D device  . LBBB (left bundle branch block)   . Lung nodule    [facility]  . Migraines   . Mitral regurgitation    Severe before CRT with annular dilatation  . Orthostatic syncope     Past Surgical History:  Procedure Laterality Date  . BI-VENTRICULAR IMPLANTABLE CARDIOVERTER DEFIBRILLATOR N/A 12/30/2011   Procedure: BI-VENTRICULAR IMPLANTABLE CARDIOVERTER DEFIBRILLATOR  (CRT-D);  Surgeon: Thompson Grayer, MD;  Location: [facility] CATH LAB;  Service: Cardiovascular;  Laterality: N/A;  . CARDIAC DEFIBRILLATOR PLACEMENT  12/29/12   BiV ICD implanted, generator change 12/30/11 by Dr Rayann Heman MDT Protecta XT CRT-D  . ORIF ANKLE FRACTURE Right 08/31/2014   Procedure: OPEN REDUCTION INTERNAL FIXATION (ORIF) ANKLE FRACTURE;  Surgeon: Marybelle Killings, MD;  Location: [place];  Service: Orthopedics;  Laterality: Right;    Family History  Problem Relation Age of Onset  . Arthritis Mother   . Emphysema Father   . Lung cancer Brother     Social History Social History   Tobacco Use  . Smoking status: Former Smoker    Packs/day: 0.50    Years: 45.00    Pack years: 22.50  Types: Cigarettes, E-cigarettes    Start date: 02/04/1956    Quit date: 11/15/2019    Years since quitting: 0.3  . Smokeless tobacco: Never Used  . Tobacco comment: he quit in June! 05/25/14 - tying to quit again   Vaping Use  . Vaping Use: Never used  Substance Use Topics  . Alcohol use: Yes    Alcohol/week: 0.0 standard drinks    Comment: Occasionally  . Drug use: No    Current Outpatient Medications  Medication Sig Dispense Refill  . acetaminophen (TYLENOL) 500 MG tablet Take 1,000 mg by mouth daily.    Marland Kitchen albuterol (PROVENTIL HFA;VENTOLIN HFA) 108 (90 Base) MCG/ACT inhaler Inhale 2 puffs into the lungs every 4 (four) hours as  needed for wheezing or shortness of breath.    Marland Kitchen atorvastatin (LIPITOR) 80 MG tablet Take 80 mg by mouth daily.    . busPIRone (BUSPAR) 10 MG tablet Take 5 mg by mouth 2 (two) times daily.     . carbamide peroxide (DEBROX) 6.5 % otic solution Place 5 drops into both ears daily as needed (for ear vwax removal).    . carvedilol (COREG) 25 MG tablet Take 12.5 mg by mouth 2 (two) times daily.    . cetirizine (ZYRTEC) 10 MG tablet Take 10 mg by mouth daily.    . Cholecalciferol (VITAMIN D3) 2000 units TABS Take 2 tablets by mouth daily.    . diclofenac sodium (VOLTAREN) 1 % GEL Apply 2 g topically 4 (four) times daily as needed (painful joints).     . divalproex (DEPAKOTE) 500 MG 24 hr tablet Take 1,500 mg by mouth at bedtime.    . finasteride (PROSCAR) 5 MG tablet Take 5 mg by mouth daily.    . folic acid (FOLVITE) 1 MG tablet Take 1 mg by mouth daily.    . furosemide (LASIX) 40 MG tablet Take 0.5 tablets (20 mg total) by mouth daily as needed for fluid or edema (weight gain > 3lbs). 30 tablet   . gabapentin (NEURONTIN) 300 MG capsule Take 300-600 mg by mouth at bedtime.     . hydroxypropyl methylcellulose (ISOPTO TEARS) 2.5 % ophthalmic solution Place 1 drop into both eyes 4 (four) times daily as needed for dry eyes.    . Melatonin 10 MG TABS Take 1 tablet by mouth at bedtime.     . Misc Natural Products (OSTEO BI-FLEX JOINT SHIELD PO) Take 2 tablets by mouth daily.    . Multiple Vitamin (MULTIVITAMIN) tablet Take 1 tablet by mouth daily.    . nitroGLYCERIN (NITROSTAT) 0.4 MG SL tablet Place 0.4 mg under the tongue every 5 (five) minutes as needed.    . Omega-3 Fatty Acids (FISH OIL) 1000 MG CAPS Take 1 capsule by mouth daily.    Marland Kitchen omeprazole (PRILOSEC) 20 MG capsule Take 20 mg by mouth daily.    . potassium chloride SA (K-DUR,KLOR-CON) 20 MEQ tablet Take 1 tablet (20 mEq total) by mouth daily as needed (In the days when you take Lasix).    . Tiotropium Bromide-Olodaterol (STIOLTO RESPIMAT) 2.5-2.5  MCG/ACT AERS Inhale 2 puffs into the lungs daily.     No current facility-administered medications for this visit.    Allergies  Allergen Reactions  . Codeine Other (See Comments)    Tremors   . Morphine Sulfate Other (See Comments)    REACTION: internal hemorrage  . Mupirocin Rash    Review of Systems  Constitutional: Positive for appetite change and unexpected weight change. Negative  for activity change.  HENT: Negative for trouble swallowing and voice change.   Respiratory: Negative for chest tightness, shortness of breath and wheezing.   Cardiovascular: Negative for chest pain and leg swelling.  Genitourinary: Negative for difficulty urinating and dysuria.  Musculoskeletal: Positive for arthralgias, gait problem and joint swelling.  Neurological: Negative for seizures, syncope and weakness.  Hematological: Negative for adenopathy. Does not bruise/bleed easily.  All other systems reviewed and are negative.   BP (!) 170/112 (BP Location: Right Arm, Patient Position: Sitting, Cuff Size: Normal)   Pulse (!) 103   Temp (!) 97.3 F (36.3 C) (Skin)   Resp 20   Ht 5\' 7"  (1.702 m)   Wt 164 lb (74.4 kg)   SpO2 94% Comment: RA  BMI 25.69 kg/m  Physical Exam Vitals reviewed.  Constitutional:      General: He is not in acute distress.    Appearance: Normal appearance.  HENT:     Head: Normocephalic and atraumatic.  Eyes:     General: No scleral icterus.    Extraocular Movements: Extraocular movements intact.  Cardiovascular:     Rate and Rhythm: Normal rate and regular rhythm.     Heart sounds: Murmur (Faint systolic) heard.    Pulmonary:     Effort: Pulmonary effort is normal. No respiratory distress.     Breath sounds: Wheezing (faint) present. No rales.     Comments: Diminished breath sounds bilaterally Abdominal:     General: There is no distension.     Palpations: Abdomen is soft.  Musculoskeletal:        General: No swelling.     Cervical back: Neck supple.   Lymphadenopathy:     Cervical: No cervical adenopathy.  Skin:    General: Skin is warm and dry.  Neurological:     General: No focal deficit present.     Mental Status: He is alert and oriented to person, place, and time.     Gait: Gait abnormal.    Diagnostic Tests: I personally reviewed the outside CT images that are in our PACS system and concur with the finding of a right lower lobe lung nodule.  There are emphysematous changes.  There is evidence of coronary disease.  There is an implantable defibrillator.  There is no significant hilar or mediastinal adenopathy.  I reviewed the PET/CT images that are on a disc.  This is a single whole body PET image without a CT correlate.  There is some activity in the right lower lobe nodule.  Echocardiogram 06/15/2019 1.  Left-ventricular ejection fraction, by estimation, is 55 to 60%.  Left ventricle has normal function.  Blood pressure close no regional wall motion abnormalities.  Left ventricular diastolic parameters are indeterminate. 2.  Right ventricular systolic function is normal.  The right ventricular size is normal.  There is normal pulmonary artery systolic pressure. 3.  Mitral valve is grossly normal.  Mild mitral valve regurgitation. 4.  Aortic valve is tricuspid.  Aortic valve regurgitation is trivial.  Mild to moderate aortic valve sclerosis/calcification is present, without any evidence of aortic stenosis. 5.  Inferior vena cava is normal in size with greater than 50% respiratory variability, suggesting right atrial pressure 3 mmHg.  Impression: Paul [name] is a 80 year old man with a complicated past medical history including tobacco abuse, COPD, cardiomyopathy, CAD, ICD, left bundle branch block, mitral regurgitation, orthostatic syncope, hypertension, hyperlipidemia, and gout.  He was found to have a lung nodule several months ago.  He had  a biopsy back in November which showed squamous cell carcinoma.  He had a PET/CT which  showed no evidence of metastatic disease.  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  Unfortunately his work-up and treatment have been delayed due to significant transportation issues.  From a purely technical standpoint this is resectable disease.  I do however have some concerns about his fitness for surgery.  Is hard to get a read on his activities so would like to do a 6-minute walk test.  He cannot do that today because of transportation issues so we will have to do that when he comes back next week.  He has not yet had pulmonary function testing.  He needs PFTs with and without bronchodilators before we can have any serious discussion about surgery.  He seems to be doing reasonably well from a cardiac standpoint.  We would need cardiology clearance prior to surgery.  He does have significant coronary calcification on CT but denies any anginal symptoms.  It has been 3 months since his last hand.  We need to repeat a CT before the we can make any plans for surgery.  I did strongly encourage him to consider radiation.  I think with the overall picture that might be his best option, particularly if stereotactic radiation is feasible.  If he does wish to pursue surgery we will do that PFTs, repeat CT, and 6-minute walk test and then meet with him again next week.  Plan: PFTs 6-minute walk test CT chest Return in 1 week to discuss possible surgical resection  I spent 49 minutes in review of records, images, and in consultation with Paul [name] today. Melrose Nakayama, MD [facility] [phone]

## 2020-03-13 ENCOUNTER — Ambulatory Visit: Payer: Medicare Other | Admitting: Thoracic Surgery (Cardiothoracic Vascular Surgery)

## 2020-03-13 ENCOUNTER — Other Ambulatory Visit (HOSPITAL_COMMUNITY)
Admission: RE | Admit: 2020-03-13 | Discharge: 2020-03-13 | Disposition: A | Discharge status: Home / Self Care | Payer: Medicare Other | Source: Ambulatory Visit | Attending: Thoracic Surgery (Cardiothoracic Vascular Surgery) | Admitting: Thoracic Surgery (Cardiothoracic Vascular Surgery)

## 2020-03-13 NOTE — Progress Notes (Signed)
Left message on patient's voicemail. I asked him to call me back if he could come in later this afternoon or tomorrow. His COVID test was at 11:00. Pt. Called back and said he is going to start radiation treatments at [facility] in [place] (where he lives). I explained to the pt. He would still need a covid and PFT. Patient said they could do everything there.

## 2020-03-15 ENCOUNTER — Encounter (HOSPITAL_COMMUNITY): Payer: Medicare Other

## 2020-03-20 ENCOUNTER — Ambulatory Visit: Payer: Medicare Other | Admitting: Thoracic Surgery (Cardiothoracic Vascular Surgery)

## 2020-03-20 ENCOUNTER — Other Ambulatory Visit: Payer: Medicare Other

## 2020-03-27 ENCOUNTER — Ambulatory Visit: Payer: Medicare Other | Admitting: Thoracic Surgery (Cardiothoracic Vascular Surgery)

## 2020-03-27 ENCOUNTER — Other Ambulatory Visit: Payer: Medicare Other

## 2020-03-28 ENCOUNTER — Encounter: Payer: Self-pay | Admitting: Thoracic Surgery (Cardiothoracic Vascular Surgery)

## 2020-04-20 ENCOUNTER — Ambulatory Visit (INDEPENDENT_AMBULATORY_CARE_PROVIDER_SITE_OTHER): Payer: Medicare Other

## 2020-04-20 DIAGNOSIS — I429 Cardiomyopathy, unspecified: Secondary | ICD-10-CM | POA: Diagnosis not present

## 2020-04-20 LAB — CUP PACEART REMOTE DEVICE CHECK
Battery Remaining Longevity: 77 mo
Battery Voltage: 2.99 V
Brady Statistic AP VP Percent: 3.12 %
Brady Statistic AP VS Percent: 0.01 %
Brady Statistic AS VP Percent: 96.84 %
Brady Statistic AS VS Percent: 0.03 %
Brady Statistic RA Percent Paced: 3.1 %
Brady Statistic RV Percent Paced: 99.11 %
Date Time Interrogation Session: 20220318001802
HighPow Impedance: 51 Ohm
HighPow Impedance: 68 Ohm
Implantable Lead Implant Date: 20081014
Implantable Lead Implant Date: 20081014
Implantable Lead Implant Date: 20081014
Implantable Lead Location: 753858
Implantable Lead Location: 753859
Implantable Lead Location: 753860
Implantable Lead Model: 4542
Implantable Lead Model: 5076
Implantable Lead Model: 6947
Implantable Lead Serial Number: 128499
Implantable Pulse Generator Implant Date: 20191025
Lead Channel Impedance Value: 418 Ohm
Lead Channel Impedance Value: 475 Ohm
Lead Channel Impedance Value: 532 Ohm
Lead Channel Impedance Value: 551 Ohm
Lead Channel Impedance Value: 589 Ohm
Lead Channel Impedance Value: 988 Ohm
Lead Channel Pacing Threshold Amplitude: 0.625 V
Lead Channel Pacing Threshold Amplitude: 0.625 V
Lead Channel Pacing Threshold Amplitude: 0.625 V
Lead Channel Pacing Threshold Pulse Width: 0.4 ms
Lead Channel Pacing Threshold Pulse Width: 0.4 ms
Lead Channel Pacing Threshold Pulse Width: 0.4 ms
Lead Channel Sensing Intrinsic Amplitude: 1.625 mV
Lead Channel Sensing Intrinsic Amplitude: 1.625 mV
Lead Channel Sensing Intrinsic Amplitude: 18.875 mV
Lead Channel Sensing Intrinsic Amplitude: 18.875 mV
Lead Channel Setting Pacing Amplitude: 1.25 V
Lead Channel Setting Pacing Amplitude: 1.5 V
Lead Channel Setting Pacing Amplitude: 2 V
Lead Channel Setting Pacing Pulse Width: 0.4 ms
Lead Channel Setting Pacing Pulse Width: 0.4 ms
Lead Channel Setting Sensing Sensitivity: 0.3 mV

## 2020-04-27 NOTE — Progress Notes (Signed)
Remote ICD transmission.   

## 2020-05-02 ENCOUNTER — Encounter: Payer: Self-pay | Admitting: Radiation Oncology

## 2020-05-02 NOTE — Progress Notes (Signed)
Thoracic Location of Tumor / Histology: Squamous cell carcinoma right lower lobe lung  Lesion noted on CT of chest done 09/19/2019 at the [facility] hospital. PET done then CT guided biopsy done 12/22/2019 revealed cancer.  Biopsies of RLL lung (if applicable) revealed:  FINAL MICROSCOPIC DIAGNOSIS:   A. LUNG, RIGHT LOWER LOBE, NEEDLE CORE BIOPSY:  - Squamous cell carcinoma. See comment    Tobacco/Marijuana/Snuff/ETOH use: yes, former smoker  Past/Anticipated interventions by cardiothoracic surgery, if any: Per Dr. Leonarda Salon note on 03/06/2020 "the patient needs a repeat CT and PFTs prior to serious consideration of surgery."  Past/Anticipated interventions by medical oncology, if any: no  Signs/Symptoms  Weight changes, if any: denies  Respiratory complaints, if any: Reports mild to moderate dyspnea upon exertion. Denies cough. Faint wheezing noted.  Hemoptysis, if any: Denies  Pain issues, if any:  Denies new pain. Reports pain in ankle related to fall four years ago.  SAFETY ISSUES:  Prior radiation? denies  Pacemaker/ICD? yes, ICD   Possible current pregnancy?no, male patient  Is the patient on methotrexate? denies  Current Complaints / other details:  80 year old male. Widowed with 4 children. Resides in [place]. Has transportation issues. Referral to Dr. Tammi Klippel from Dr. Adella Nissen (radiation oncologist in [place]) for consideration of SRS. Lives alone with dog, Beckham, a [place].

## 2020-05-03 ENCOUNTER — Ambulatory Visit
Admission: RE | Admit: 2020-05-03 | Discharge: 2020-05-03 | Disposition: A | Discharge status: Home / Self Care | Payer: Medicare Other | Source: Ambulatory Visit | Attending: Radiation Oncology | Admitting: Radiation Oncology

## 2020-05-03 ENCOUNTER — Ambulatory Visit
Admission: RE | Admit: 2020-05-03 | Discharge: 2020-05-03 | Disposition: A | Discharge status: Home / Self Care | Payer: Self-pay | Source: Ambulatory Visit | Attending: Radiation Oncology | Admitting: Radiation Oncology

## 2020-05-03 ENCOUNTER — Other Ambulatory Visit: Payer: Self-pay | Admitting: Radiation Oncology

## 2020-05-03 ENCOUNTER — Encounter: Payer: Self-pay | Admitting: Radiation Oncology

## 2020-05-03 ENCOUNTER — Other Ambulatory Visit: Payer: Self-pay

## 2020-05-03 DIAGNOSIS — C3430 Malignant neoplasm of lower lobe, unspecified bronchus or lung: Secondary | ICD-10-CM

## 2020-05-03 DIAGNOSIS — C3431 Malignant neoplasm of lower lobe, right bronchus or lung: Secondary | ICD-10-CM | POA: Insufficient documentation

## 2020-05-03 HISTORY — DX: Malignant neoplasm of unspecified part of unspecified bronchus or lung: C34.90

## 2020-05-03 NOTE — Progress Notes (Signed)
Radiation Oncology         [phone]) [phone] ________________________________  Initial outpatient Consultation  Name: Paul [name]. 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 Non-small cell cancer of lower lobe of lung (HCC)  C34.30     HISTORY OF PRESENT ILLNESS: Paul Fraizer. is a 80 y.o. male seen at the request of Dr. Adella Nissen.  He has a prior history of smoking and had a lung cancer screening CT chest at the [facility] on 04/19/2019 showing a 1.8 cm right lower lobe nodule as well as a 7 mm subpleural nodule in the lingula.  There was no evidence of lymphadenopathy.  He had further evaluation of these new findings with a PET performed on 11/19/19 which confirmed mild activity in the right lower lobe nodule.      He proceeded to CT-guided biopsy of the right lower lobe lung lesion on 12/22/2019 which confirmed NSCLC, squamous cell carcinoma.  He was referred to Dr. Adella Nissen, in [place], on 02/01/2020 to discuss treatment options but was also interested in possible surgical resection.  Therefore, he met with Dr. Roxan Hockey on 03/06/2020 to discuss surgery but was felt to be a better candidate for stereotactic body radiotherapy (SBRT) given his advanced age and multiple medical comorbidities.  Given the length of time since his initial scan and diagnosis, the recommendation was to proceed with repeat CT chest for restaging.  This scan was performed on 04/06/2020 and did show interval increase in size of the right lower lobe nodule, now measuring 2.4 cm, but feels no lymphadenopathy and a stable appearance of the 8 mm subpleural nodule in the lingula.  He met back with Dr. Adella Nissen on 04/25/2020 to review the results and treatment recommendations which were to proceed with  SBRT and the patient was in agreement.  Therefore, he has been kindly referred to [place] today to discuss proceeding with focused radiation targeting the right lower lobe lung nodule using stereotactic body radiotherapy (SBRT).  PREVIOUS RADIATION THERAPY: No  PAST MEDICAL HISTORY:  Past Medical History:  Diagnosis Date  . BPH (benign prostatic hyperplasia)   . CAD (coronary artery disease)    Distal circumflex and OM disease with collaterals 2008  . Cardiomyopathy    LVEF 25% up to 65%  . COPD (chronic obstructive pulmonary disease) ([place])   . Dyslipidemia   . Essential hypertension   . Gout   . History of alcohol abuse   . ICD (implantable cardiac defibrillator) battery depletion    CRT-D device  . LBBB (left bundle branch block)   . Lung cancer ([place])    Squamous cell carcinoma RLL lung  . Lung nodule    [facility]  . Migraines   . Mitral regurgitation    Severe before CRT with annular dilatation  . Orthostatic syncope       PAST SURGICAL HISTORY: Past Surgical History:  Procedure Laterality Date  . BI-VENTRICULAR IMPLANTABLE CARDIOVERTER DEFIBRILLATOR N/A 12/30/2011   Procedure: BI-VENTRICULAR IMPLANTABLE CARDIOVERTER DEFIBRILLATOR  (CRT-D);  Surgeon: Thompson Grayer, MD;  Location: [facility] CATH LAB;  Service: Cardiovascular;  Laterality: N/A;  . CARDIAC DEFIBRILLATOR PLACEMENT  12/29/12   BiV ICD implanted, generator change 12/30/11 by Dr Rayann Heman MDT Sim Boast XT  CRT-D  . ORIF ANKLE FRACTURE Right 08/31/2014   Procedure: OPEN REDUCTION INTERNAL FIXATION (ORIF) ANKLE FRACTURE;  Surgeon: Marybelle Killings, MD;  Location: [facility];  Service: Orthopedics;  Laterality: Right;    FAMILY HISTORY:  Family History  Problem Relation Age of Onset  . Arthritis Mother   . Emphysema Father   . Lung cancer Brother     SOCIAL HISTORY:  Social History   Socioeconomic History  . Marital status: Widowed    Spouse name: Not on file  . Number of children: 4  . Years of education: Not on file   . Highest education level: Not on file  Occupational History  . Occupation: RETIRED    Employer: RETIRED  Tobacco Use  . Smoking status: Former Smoker    Packs/day: 0.50    Years: 45.00    Pack years: 22.50    Types: Cigarettes, E-cigarettes    Start date: 02/04/1956    Quit date: 11/15/2019    Years since quitting: 0.4  . Smokeless tobacco: Never Used  . Tobacco comment: he quit in June! 05/25/14 - tying to quit again   Vaping Use  . Vaping Use: Never used  Substance and Sexual Activity  . Alcohol use: Yes    Alcohol/week: 0.0 standard drinks    Comment: Occasionally  . Drug use: No  . Sexual activity: Yes  Other Topics Concern  . Not on file  Social History Narrative  . Not on file   Social Determinants of Health   Financial Resource Strain: Not on file  Food Insecurity: Not on file  Transportation Needs: Not on file  Physical Activity: Not on file  Stress: Not on file  Social Connections: Not on file  Intimate Partner Violence: Not on file    ALLERGIES: Codeine, Morphine sulfate, and Mupirocin  MEDICATIONS:  Current Outpatient Medications  Medication Sig Dispense Refill  . acetaminophen (TYLENOL) 500 MG tablet Take 1,000 mg by mouth daily.    Marland Kitchen albuterol (PROVENTIL HFA;VENTOLIN HFA) 108 (90 Base) MCG/ACT inhaler Inhale 2 puffs into the lungs every 4 (four) hours as needed for wheezing or shortness of breath.    Marland Kitchen atorvastatin (LIPITOR) 80 MG tablet Take 80 mg by mouth daily.    . busPIRone (BUSPAR) 10 MG tablet Take 5 mg by mouth 2 (two) times daily.     . carbamide peroxide (DEBROX) 6.5 % otic solution Place 5 drops into both ears daily as needed (for ear vwax removal).    . carvedilol (COREG) 25 MG tablet Take 12.5 mg by mouth 2 (two) times daily.    . cetirizine (ZYRTEC) 10 MG tablet Take 10 mg by mouth daily.    . Cholecalciferol (VITAMIN D3) 2000 units TABS Take 2 tablets by mouth daily.    . diclofenac sodium (VOLTAREN) 1 % GEL Apply 2 g topically 4 (four)  times daily as needed (painful joints).     . divalproex (DEPAKOTE) 500 MG 24 hr tablet Take 1,500 mg by mouth at bedtime.    . finasteride (PROSCAR) 5 MG tablet Take 5 mg by mouth daily.    . folic acid (FOLVITE) 1 MG tablet Take 1 mg by mouth daily.    . furosemide (LASIX) 40 MG tablet Take 0.5 tablets (20 mg total) by mouth daily as needed for fluid or edema (weight gain > 3lbs). 30 tablet   . gabapentin (NEURONTIN) 300 MG capsule Take 300-600 mg by mouth at bedtime.     . hydroxypropyl methylcellulose (  ISOPTO TEARS) 2.5 % ophthalmic solution Place 1 drop into both eyes 4 (four) times daily as needed for dry eyes.    . Melatonin 10 MG TABS Take 1 tablet by mouth at bedtime.     . Misc Natural Products (OSTEO BI-FLEX JOINT SHIELD PO) Take 2 tablets by mouth daily.    . Multiple Vitamin (MULTIVITAMIN) tablet Take 1 tablet by mouth daily.    . nitroGLYCERIN (NITROSTAT) 0.4 MG SL tablet Place 0.4 mg under the tongue every 5 (five) minutes as needed.    . Omega-3 Fatty Acids (FISH OIL) 1000 MG CAPS Take 1 capsule by mouth daily.    Marland Kitchen omeprazole (PRILOSEC) 20 MG capsule Take 20 mg by mouth daily.    . potassium chloride SA (K-DUR,KLOR-CON) 20 MEQ tablet Take 1 tablet (20 mEq total) by mouth daily as needed (In the days when you take Lasix).    . Tiotropium Bromide-Olodaterol (STIOLTO RESPIMAT) 2.5-2.5 MCG/ACT AERS Inhale 2 puffs into the lungs daily.     No current facility-administered medications for this encounter.    REVIEW OF SYSTEMS:  On review of systems, the patient reports that he is doing well overall. He denies any chest pain, increased shortness of breath, productive cough, hemoptysis, fevers, chills, night sweats, unintended weight changes. He denies any bowel or bladder disturbances, and denies abdominal pain, nausea or vomiting. He denies any new musculoskeletal or joint aches or pains. A complete review of systems is obtained and is otherwise negative.  PHYSICAL EXAM:  Wt Readings  from Last 3 Encounters:  03/06/20 164 lb (74.4 kg)  01/20/20 158 lb 12.8 oz (72 kg)  12/22/19 154 lb (69.9 kg)   Temp Readings from Last 3 Encounters:  03/06/20 (!) 97.3 F (36.3 C) (Skin)  12/23/19 97.7 F (36.5 C) (Oral)  12/01/19 98.1 F (36.7 C) (Oral)   BP Readings from Last 3 Encounters:  03/06/20 (!) 170/112  01/20/20 116/80  12/23/19 (!) 146/84   Pulse Readings from Last 3 Encounters:  03/06/20 (!) 103  01/20/20 71  12/23/19 60    /10  Unable to assess due to telephone consult visit format.  KPS = 90  100 - Normal; no complaints; no evidence of disease. 90   - Able to carry on normal activity; minor signs or symptoms of disease. 80   - Normal activity with effort; some signs or symptoms of disease. 52   - Cares for self; unable to carry on normal activity or to do active work. 60   - Requires occasional assistance, but is able to care for most of his personal needs. 50   - Requires considerable assistance and frequent medical care. 17   - Disabled; requires special care and assistance. 11   - Severely disabled; hospital admission is indicated although death not imminent. 56   - Very sick; hospital admission necessary; active supportive treatment necessary. 10   - Moribund; fatal processes progressing rapidly. 0     - Dead  Karnofsky DA, Abelmann Asbury, Craver LS and Burchenal [facility] [phone]) The use of the nitrogen mustards in the palliative treatment of carcinoma: with particular reference to bronchogenic carcinoma Cancer 1 [id number]  LABORATORY DATA:  Lab Results  Component Value Date   WBC 6.3 12/22/2019   HGB 12.2 (L) 12/22/2019   HCT 38.3 (L) 12/22/2019   MCV 93.9 12/22/2019   PLT 199 12/22/2019   Lab Results  Component Value Date   NA 138 09/03/2014   K 4.3 09/03/2014  CL 106 09/03/2014   CO2 23 09/03/2014   Lab Results  Component Value Date   ALT 9 (L) 08/31/2014   AST 14 (L) 08/31/2014   ALKPHOS 31 (L) 08/31/2014   BILITOT 0.5 08/31/2014      RADIOGRAPHY: CUP PACEART REMOTE DEVICE CHECK  Result Date: 04/20/2020 Scheduled remote reviewed. Normal device function.  1 NSVT event lasting 15 beats w/ rate 190's bpm Next remote 91 days. HB     IMPRESSION/PLAN: 1. 80 y.o. 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  Today, we talked to the patient and family about the findings and workup thus far. We discussed the natural history of Stage IA, NSCLC, squamous cell carcinoma and general treatment, highlighting the role of radiotherapy in the management. We discussed the available radiation techniques, and focused on the details and logistics of delivery. The recommendation is to proceed with a 3 fraction course of stereotactic body radiotherapy (SBRT) targeting the RLL nodule. We reviewed the anticipated acute and late sequelae associated with radiation in this setting. The patient was encouraged to ask questions that were answered to his satisfaction.  At the conclusion of our conversation, the patient elects to proceed with the recommended course of SBRT as outlined above.  He has provided verbal consent to proceed today and will sign formal written consent to proceed at the time of his CT simulation. A copy of this document will be placed in his medical record.  He is tentatively scheduled for CT simulation/treatment planning on 05/11/20 at 1 pm with expedited treatment start on Monday 4/11, Wednesday 4/13 and Friday 4/15.  We will share our discussion with Dr. Adella Nissen and Dr. Roxan Hockey and move forward with treatment planning accordingly.  We enjoyed meeting him today and look forward to continuing to participate in his care.    [name], PA-C    Tyler Pita, MD  [facility] Direct Dial: [phone]  Fax: [phone] [email or web address]  Skype  [email or web address]    I spent 60 min on this encounter total - MM

## 2020-05-11 ENCOUNTER — Ambulatory Visit
Admission: RE | Admit: 2020-05-11 | Discharge: 2020-05-11 | Disposition: A | Discharge status: Home / Self Care | Payer: No Typology Code available for payment source | Source: Ambulatory Visit | Attending: Radiation Oncology | Admitting: Radiation Oncology

## 2020-05-11 ENCOUNTER — Other Ambulatory Visit: Payer: Self-pay

## 2020-05-11 DIAGNOSIS — Z510 Encounter for antineoplastic radiation therapy: Secondary | ICD-10-CM | POA: Diagnosis present

## 2020-05-11 DIAGNOSIS — Z87891 Personal history of nicotine dependence: Secondary | ICD-10-CM | POA: Diagnosis not present

## 2020-05-11 DIAGNOSIS — C3431 Malignant neoplasm of lower lobe, right bronchus or lung: Secondary | ICD-10-CM | POA: Insufficient documentation

## 2020-05-11 NOTE — Progress Notes (Signed)
  Radiation Oncology         [phone]) [phone] ________________________________  Name: Paul [name]. MRN: [id number]  Date: 05/11/2020  DOB: 07-May-1940  STEREOTACTIC BODY RADIOTHERAPY SIMULATION AND TREATMENT PLANNING NOTE    ICD-10-CM   1. 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  Identity was confirmed.  All relevant records and images related to the planned course of therapy were reviewed.  The patient freely provided informed written consent to proceed with treatment after reviewing the details related to the planned course of therapy. The consent form was witnessed and verified by the simulation staff.  Then, the patient was set-up in a stable reproducible  supine position for radiation therapy.  A BodyFix immobilization pillow was fabricated for reproducible positioning.  Then I personally applied the abdominal compression paddle to limit respiratory excursion.  4D respiratoy motion management CT images were obtained.  Surface markings were placed.  The CT images were loaded into the planning software.  Then, using Cine, MIP, and standard views, the internal target volume (ITV) and planning target volumes (PTV) were delinieated, and avoidance structures were contoured.  Treatment planning then occurred.  The radiation prescription was entered and confirmed.  A total of two complex treatment devices were fabricated in the form of the BodyFix immobilization pillow and a neck accuform cushion.  I have requested : 3D Simulation  I have requested a DVH of the following structures: Heart, Lungs, Esophagus, Chest Wall, Brachial Plexus, Major Blood Vessels, and targets.  SPECIAL TREATMENT PROCEDURE:  The planned course of therapy using radiation constitutes a special treatment procedure. Special  care is required in the management of this patient for the following reasons. This treatment constitutes a Special Treatment Procedure for the following reason: [ High dose per fraction requiring special monitoring for increased toxicities of treatment including daily imaging..  The special nature of the planned course of radiotherapy will require increased physician supervision and oversight to ensure patient's safety with optimal treatment outcomes.  RESPIRATORY MOTION MANAGEMENT SIMULATION:  In order to account for effect of respiratory motion on target structures and other organs in the planning and delivery of radiotherapy, this patient underwent respiratory motion management simulation.  To accomplish this, when the patient was brought to the CT simulation planning suite, 4D respiratoy motion management CT images were obtained.  The CT images were loaded into the planning software.  Then, using a variety of tools including Cine, MIP, and standard views, the target volume and planning target volumes (PTV) were delineated.  Avoidance structures were contoured.  Treatment planning then occurred.  Dose volume histograms were generated and reviewed for each of the requested structure.  The resulting plan was carefully reviewed and approved today.  PLAN:  The patient will receive 54 Gy in 3 fractions.  ________________________________  Sheral Apley Tammi Klippel, M.D.

## 2020-05-14 ENCOUNTER — Other Ambulatory Visit: Payer: Self-pay

## 2020-05-14 ENCOUNTER — Ambulatory Visit
Admission: RE | Admit: 2020-05-14 | Discharge: 2020-05-14 | Disposition: A | Discharge status: Home / Self Care | Payer: No Typology Code available for payment source | Source: Ambulatory Visit | Attending: Radiation Oncology | Admitting: Radiation Oncology

## 2020-05-14 DIAGNOSIS — Z510 Encounter for antineoplastic radiation therapy: Secondary | ICD-10-CM | POA: Diagnosis not present

## 2020-05-14 DIAGNOSIS — C3431 Malignant neoplasm of lower lobe, right bronchus or lung: Secondary | ICD-10-CM

## 2020-05-14 DIAGNOSIS — Z87891 Personal history of nicotine dependence: Secondary | ICD-10-CM | POA: Diagnosis not present

## 2020-05-15 ENCOUNTER — Ambulatory Visit: Payer: No Typology Code available for payment source

## 2020-05-16 ENCOUNTER — Other Ambulatory Visit: Payer: Self-pay

## 2020-05-16 ENCOUNTER — Ambulatory Visit
Admission: RE | Admit: 2020-05-16 | Discharge: 2020-05-16 | Disposition: A | Discharge status: Home / Self Care | Payer: No Typology Code available for payment source | Source: Ambulatory Visit | Attending: Radiation Oncology | Admitting: Radiation Oncology

## 2020-05-16 DIAGNOSIS — Z87891 Personal history of nicotine dependence: Secondary | ICD-10-CM | POA: Diagnosis not present

## 2020-05-16 DIAGNOSIS — Z510 Encounter for antineoplastic radiation therapy: Secondary | ICD-10-CM | POA: Diagnosis not present

## 2020-05-16 DIAGNOSIS — C3431 Malignant neoplasm of lower lobe, right bronchus or lung: Secondary | ICD-10-CM

## 2020-05-18 ENCOUNTER — Encounter: Payer: Self-pay | Admitting: Radiation Oncology

## 2020-05-18 ENCOUNTER — Other Ambulatory Visit: Payer: Self-pay

## 2020-05-18 ENCOUNTER — Ambulatory Visit
Admission: RE | Admit: 2020-05-18 | Discharge: 2020-05-18 | Disposition: A | Discharge status: Home / Self Care | Payer: No Typology Code available for payment source | Source: Ambulatory Visit | Attending: Radiation Oncology | Admitting: Radiation Oncology

## 2020-05-18 ENCOUNTER — Encounter: Payer: Self-pay | Admitting: Radiology

## 2020-05-18 DIAGNOSIS — Z87891 Personal history of nicotine dependence: Secondary | ICD-10-CM | POA: Diagnosis not present

## 2020-05-18 DIAGNOSIS — C3431 Malignant neoplasm of lower lobe, right bronchus or lung: Secondary | ICD-10-CM | POA: Diagnosis not present

## 2020-05-18 DIAGNOSIS — Z510 Encounter for antineoplastic radiation therapy: Secondary | ICD-10-CM | POA: Diagnosis not present

## 2020-06-13 NOTE — Progress Notes (Addendum)
Called Paul [name] today  to make him aware of his telephone appointment with Ashlyn Bruning on 06/20/2020.Patient states that he has transferred his care to the [facility]  in [place] and that he does not wish to have the follow-up appointment with Ashlyn. Notified Ashlyn and the appointment will be cancelled.

## 2020-06-20 ENCOUNTER — Ambulatory Visit
Admission: RE | Admit: 2020-06-20 | Discharge: 2020-06-20 | Disposition: A | Discharge status: Home / Self Care | Payer: Medicare Other | Source: Ambulatory Visit | Attending: Urology | Admitting: Urology

## 2020-06-20 DIAGNOSIS — C3431 Malignant neoplasm of lower lobe, right bronchus or lung: Secondary | ICD-10-CM

## 2020-07-20 ENCOUNTER — Ambulatory Visit (INDEPENDENT_AMBULATORY_CARE_PROVIDER_SITE_OTHER): Payer: Medicare Other

## 2020-07-20 DIAGNOSIS — I429 Cardiomyopathy, unspecified: Secondary | ICD-10-CM | POA: Diagnosis not present

## 2020-07-20 LAB — CUP PACEART REMOTE DEVICE CHECK
Battery Remaining Longevity: 74 mo
Battery Voltage: 2.99 V
Brady Statistic AP VP Percent: 2.33 %
Brady Statistic AP VS Percent: 0.01 %
Brady Statistic AS VP Percent: 97.54 %
Brady Statistic AS VS Percent: 0.11 %
Brady Statistic RA Percent Paced: 2.33 %
Brady Statistic RV Percent Paced: 98.92 %
Date Time Interrogation Session: 20220617012304
HighPow Impedance: 52 Ohm
HighPow Impedance: 72 Ohm
Implantable Lead Implant Date: 20081014
Implantable Lead Implant Date: 20081014
Implantable Lead Implant Date: 20081014
Implantable Lead Location: 753858
Implantable Lead Location: 753859
Implantable Lead Location: 753860
Implantable Lead Model: 4542
Implantable Lead Model: 5076
Implantable Lead Model: 6947
Implantable Lead Serial Number: 128499
Implantable Pulse Generator Implant Date: 20191025
Lead Channel Impedance Value: 1007 Ohm
Lead Channel Impedance Value: 418 Ohm
Lead Channel Impedance Value: 456 Ohm
Lead Channel Impedance Value: 532 Ohm
Lead Channel Impedance Value: 551 Ohm
Lead Channel Impedance Value: 608 Ohm
Lead Channel Pacing Threshold Amplitude: 0.625 V
Lead Channel Pacing Threshold Amplitude: 0.625 V
Lead Channel Pacing Threshold Amplitude: 0.75 V
Lead Channel Pacing Threshold Pulse Width: 0.4 ms
Lead Channel Pacing Threshold Pulse Width: 0.4 ms
Lead Channel Pacing Threshold Pulse Width: 0.4 ms
Lead Channel Sensing Intrinsic Amplitude: 1.5 mV
Lead Channel Sensing Intrinsic Amplitude: 1.5 mV
Lead Channel Sensing Intrinsic Amplitude: 18.875 mV
Lead Channel Sensing Intrinsic Amplitude: 18.875 mV
Lead Channel Setting Pacing Amplitude: 1.25 V
Lead Channel Setting Pacing Amplitude: 1.5 V
Lead Channel Setting Pacing Amplitude: 2 V
Lead Channel Setting Pacing Pulse Width: 0.4 ms
Lead Channel Setting Pacing Pulse Width: 0.4 ms
Lead Channel Setting Sensing Sensitivity: 0.3 mV

## 2020-08-01 NOTE — Progress Notes (Signed)
  Radiation Oncology         [phone]) [phone] ________________________________  Name: Paul [name]. 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     Plan: The patient has completed radiation treatment. The patient will return to radiation oncology clinic for routine followup in one month. I advised him to call or return sooner if he has any questions or concerns related to his recovery or treatment. ________________________________  Sheral Apley. Tammi Klippel, M.D.

## 2020-08-08 NOTE — Progress Notes (Signed)
Remote ICD transmission.   

## 2020-09-03 ENCOUNTER — Telehealth: Payer: Self-pay | Admitting: *Deleted

## 2020-09-03 ENCOUNTER — Other Ambulatory Visit: Payer: Self-pay | Admitting: Radiation Oncology

## 2020-09-03 DIAGNOSIS — C3431 Malignant neoplasm of lower lobe, right bronchus or lung: Secondary | ICD-10-CM

## 2020-09-03 NOTE — Telephone Encounter (Signed)
RETURNED PATIENT'S PHONE CALL, SPOKE WITH PATIENT. ?

## 2020-09-04 ENCOUNTER — Telehealth: Payer: Self-pay | Admitting: *Deleted

## 2020-09-04 NOTE — Telephone Encounter (Signed)
CALLED PATIENT TO INFORM OF CT FOR 09-12-20 - ARRIVAL TIME- 10:15 AM @ [facility], NO RESTRICTIONS TO TEST, SPOKE WITH PATIENT AND HE IS AWARE OF THIS TEST

## 2020-09-05 ENCOUNTER — Telehealth: Payer: Self-pay | Admitting: *Deleted

## 2020-09-05 NOTE — Telephone Encounter (Signed)
CALLED PATIENT TO INFORM THAT ASHLYN BRUNING WILL CALL HIM  ON 09-14-20 @ 11 AM WITH HIS TEST RESULTS, PATIENT VERIFIED UNDERSTANDING THIS

## 2020-09-12 ENCOUNTER — Other Ambulatory Visit: Payer: Self-pay

## 2020-09-12 ENCOUNTER — Ambulatory Visit (HOSPITAL_COMMUNITY)
Admission: RE | Admit: 2020-09-12 | Discharge: 2020-09-12 | Disposition: A | Discharge status: Home / Self Care | Payer: No Typology Code available for payment source | Source: Ambulatory Visit | Attending: Radiation Oncology | Admitting: Radiation Oncology

## 2020-09-12 DIAGNOSIS — C3431 Malignant neoplasm of lower lobe, right bronchus or lung: Secondary | ICD-10-CM | POA: Diagnosis present

## 2020-09-14 ENCOUNTER — Encounter: Payer: Self-pay | Admitting: Urology

## 2020-09-14 ENCOUNTER — Ambulatory Visit
Admission: RE | Admit: 2020-09-14 | Discharge: 2020-09-14 | Disposition: A | Discharge status: Home / Self Care | Payer: Medicare Other | Source: Ambulatory Visit | Attending: Urology | Admitting: Urology

## 2020-09-14 DIAGNOSIS — C3431 Malignant neoplasm of lower lobe, right bronchus or lung: Secondary | ICD-10-CM

## 2020-09-14 NOTE — Progress Notes (Signed)
Radiation Oncology         [phone] ________________________________  Name: Paul [name]. 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  The patient was notified in advance and gave permission to proceed with this visit format. He tolerated his treatment well without any ill side effects and remains without complaints.   He had a recent posttreatment CT chest on 09/12/2020 which shows that the treated lesion in the right lower lobe is stable with radiation changes only and the small 7 mm nodule in the left upper lobe is stable to minimally increased in size but no definite evidence of disease progression or recurrence.                          On review of systems, the patient states that he is doing well in general.  He specifically denies chest pain, increased shortness of breath, productive cough, hemoptysis, fever, chills or night sweats.  He reports a healthy appetite and is maintaining his weight.  He denies abdominal pain, nausea, vomiting, diarrhea or constipation.  Overall, he is pleased with his progress to date.  ALLERGIES:  is allergic to codeine, morphine sulfate, and mupirocin.  Meds: Current Outpatient Medications  Medication Sig Dispense Refill   acetaminophen (TYLENOL) 500 MG tablet Take 1,000 mg by mouth daily.     albuterol (PROVENTIL HFA;VENTOLIN HFA) 108 (90 Base) MCG/ACT inhaler Inhale 2 puffs into the lungs every 4 (four) hours  as needed for wheezing or shortness of breath.     atorvastatin (LIPITOR) 80 MG tablet Take 80 mg by mouth daily.     busPIRone (BUSPAR) 10 MG tablet Take 5 mg by mouth 2 (two) times daily.      carbamide peroxide (DEBROX) 6.5 % otic solution Place 5 drops into both ears daily as needed (for ear vwax removal).     carvedilol (COREG) 25 MG tablet Take 12.5 mg by mouth 2 (two) times daily.     cetirizine (ZYRTEC) 10 MG tablet Take 10 mg by mouth daily.     Cholecalciferol (VITAMIN D3) 2000 units TABS Take 2 tablets by mouth daily.     diclofenac sodium (VOLTAREN) 1 % GEL Apply 2 g topically 4 (four) times daily as needed (painful joints).      divalproex (DEPAKOTE) 500 MG 24 hr tablet Take 1,500 mg by mouth at bedtime.     finasteride (PROSCAR) 5 MG tablet Take 5 mg by mouth daily.     folic acid (FOLVITE) 1 MG tablet Take 1 mg by mouth daily.     furosemide (LASIX) 40 MG tablet Take 0.5 tablets (20 mg total) by mouth daily as needed for fluid or edema (weight gain > 3lbs). 30 tablet    gabapentin (NEURONTIN) 300 MG capsule Take 300-600 mg by mouth at bedtime.      hydroxypropyl methylcellulose (ISOPTO TEARS) 2.5 %  ophthalmic solution Place 1 drop into both eyes 4 (four) times daily as needed for dry eyes.     Melatonin 10 MG TABS Take 1 tablet by mouth at bedtime.      Misc Natural Products (OSTEO BI-FLEX JOINT SHIELD PO) Take 2 tablets by mouth daily.     Multiple Vitamin (MULTIVITAMIN) tablet Take 1 tablet by mouth daily.     nitroGLYCERIN (NITROSTAT) 0.4 MG SL tablet Place 0.4 mg under the tongue every 5 (five) minutes as needed.     Omega-3 Fatty Acids (FISH OIL) 1000 MG CAPS Take 1 capsule by mouth daily.     omeprazole (PRILOSEC) 20 MG capsule Take 20 mg by mouth daily.     potassium chloride SA (K-DUR,KLOR-CON) 20 MEQ tablet Take 1 tablet (20 mEq total) by mouth daily as needed (In the days when you take Lasix).     Tiotropium Bromide-Olodaterol (STIOLTO RESPIMAT) 2.5-2.5 MCG/ACT AERS  Inhale 2 puffs into the lungs daily.     No current facility-administered medications for this encounter.    Physical Findings:  vitals were not taken for this visit.  Pain Assessment Pain Score: 0-No pain/10 Unable to assess due to telephone follow-up visit format.  Lab Findings: Lab Results  Component Value Date   WBC 6.3 12/22/2019   HGB 12.2 (L) 12/22/2019   HCT 38.3 (L) 12/22/2019   MCV 93.9 12/22/2019   PLT 199 12/22/2019     Radiographic Findings: CT Chest Wo Contrast  Result Date: 09/13/2020 CLINICAL DATA:  80 year old male with history of non-small cell lung cancer. Follow-up study to assess for treatment response. EXAM: CT CHEST WITHOUT CONTRAST TECHNIQUE: Multidetector CT imaging of the chest was performed following the standard protocol without IV contrast. COMPARISON:  Outside chest CT 09/19/2019. FINDINGS: Cardiovascular: Heart size is normal. There is no significant pericardial fluid, thickening or pericardial calcification. There is aortic atherosclerosis, as well as atherosclerosis of the great vessels of the mediastinum and the coronary arteries, including calcified atherosclerotic plaque in the left main, left anterior descending, left circumflex and right coronary arteries. Left-sided biventricular pacemaker/AICD with lead tips terminating in the right atrial appendage, right ventricular apex and overlying the lateral wall the left ventricle via the coronary sinus and coronary veins. Mediastinum/Nodes: No pathologically enlarged mediastinal or hilar lymph nodes. Please note that accurate exclusion of hilar adenopathy is limited on noncontrast CT scans. Esophagus is unremarkable in appearance. No axillary lymphadenopathy. Lungs/Pleura: Irregular-shaped macrolobulated nodule with spiculated margins in the superior segment of the right lower lobe (axial image 100 of series 4 and sagittal image 46 of series 6) measuring 1.5 x 2.0 x 1.3 cm which is in contact with the pleura  posteriorly which is retracted toward the lesion. 7 mm left upper lobe pulmonary nodule abutting the pleural surface (axial image 97 of series 4), stable to minimally increased (previously 6 mm when measured in a similar fashion). No acute consolidative airspace disease. No pleural effusions. Diffuse bronchial wall thickening with moderate centrilobular and paraseptal emphysema. Upper Abdomen: Aortic atherosclerosis. Musculoskeletal: There are no aggressive appearing lytic or blastic lesions noted in the visualized portions of the skeleton. IMPRESSION: 1. 1.5 x 2.0 x 1.3 cm right lower lobe pulmonary nodule, minimally increased in size compared to the prior study, highly concerning for primary bronchogenic neoplasm. 2. 7 mm subpleural nodule in the periphery of the left upper lobe appears stable to minimally increased in size compared to the prior study. 3. Diffuse bronchial wall thickening with moderate centrilobular and  paraseptal emphysema; imaging findings suggestive of underlying COPD. 4. Aortic atherosclerosis, in addition to left main and 3 vessel coronary artery disease. Aortic Atherosclerosis (ICD10-I70.0) and Emphysema (ICD10-J43.9). Electronically Signed   By: Vinnie Langton M.D.   On: 09/13/2020 09:03    Impression/Plan: 1. 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 He appears to have recovered well from the effects of his recent SBRT and is currently without complaints.  We reviewed his recent CT chest findings which overall appear stable.  We will plan for a repeat CT chest at [facility] in approximately 4 months with a telephone follow-up visit thereafter to review results and recommendations.  Pending that scan remained stable, we will then proceed with serial CT chest imaging every 6 months to continue to monitor closely for any evidence of disease progression or recurrence.  He knows that he is welcome to call at anytime  in the interim with any questions or concerns related to his previous radiation.  He appears to have a good understanding of our recommendations and is comfortable and in agreement with the stated plan.    [name], PA-C

## 2020-09-14 NOTE — Progress Notes (Signed)
Patient reports he is doing well. No pain, fatigue, cough, pailful swallowing, choking, or weight loss. Patient is experiencing some shortness of breath due to a history of COPD. Patient states appetite is good.   Meaningful use questions complete and patient notified of his 11:00am telephone appointment and expressed understanding of it.

## 2020-10-19 ENCOUNTER — Ambulatory Visit (INDEPENDENT_AMBULATORY_CARE_PROVIDER_SITE_OTHER): Payer: Medicare Other

## 2020-10-19 DIAGNOSIS — I429 Cardiomyopathy, unspecified: Secondary | ICD-10-CM

## 2020-10-19 DIAGNOSIS — I5022 Chronic systolic (congestive) heart failure: Secondary | ICD-10-CM

## 2020-10-21 LAB — CUP PACEART REMOTE DEVICE CHECK
Battery Remaining Longevity: 72 mo
Battery Voltage: 2.99 V
Brady Statistic AP VP Percent: 5.06 %
Brady Statistic AP VS Percent: 0.02 %
Brady Statistic AS VP Percent: 94.74 %
Brady Statistic AS VS Percent: 0.19 %
Brady Statistic RA Percent Paced: 4.98 %
Brady Statistic RV Percent Paced: 98.95 %
Date Time Interrogation Session: 20220916001704
HighPow Impedance: 56 Ohm
HighPow Impedance: 73 Ohm
Implantable Lead Implant Date: 20081014
Implantable Lead Implant Date: 20081014
Implantable Lead Implant Date: 20081014
Implantable Lead Location: 753858
Implantable Lead Location: 753859
Implantable Lead Location: 753860
Implantable Lead Model: 4542
Implantable Lead Model: 5076
Implantable Lead Model: 6947
Implantable Lead Serial Number: 128499
Implantable Pulse Generator Implant Date: 20191025
Lead Channel Impedance Value: 1007 Ohm
Lead Channel Impedance Value: 456 Ohm
Lead Channel Impedance Value: 475 Ohm
Lead Channel Impedance Value: 551 Ohm
Lead Channel Impedance Value: 551 Ohm
Lead Channel Impedance Value: 646 Ohm
Lead Channel Pacing Threshold Amplitude: 0.625 V
Lead Channel Pacing Threshold Amplitude: 0.625 V
Lead Channel Pacing Threshold Amplitude: 0.75 V
Lead Channel Pacing Threshold Pulse Width: 0.4 ms
Lead Channel Pacing Threshold Pulse Width: 0.4 ms
Lead Channel Pacing Threshold Pulse Width: 0.4 ms
Lead Channel Sensing Intrinsic Amplitude: 1.625 mV
Lead Channel Sensing Intrinsic Amplitude: 1.625 mV
Lead Channel Sensing Intrinsic Amplitude: 21.875 mV
Lead Channel Sensing Intrinsic Amplitude: 21.875 mV
Lead Channel Setting Pacing Amplitude: 1.25 V
Lead Channel Setting Pacing Amplitude: 1.5 V
Lead Channel Setting Pacing Amplitude: 2 V
Lead Channel Setting Pacing Pulse Width: 0.4 ms
Lead Channel Setting Pacing Pulse Width: 0.4 ms
Lead Channel Setting Sensing Sensitivity: 0.3 mV

## 2020-10-24 NOTE — Progress Notes (Signed)
Remote ICD transmission.   

## 2020-11-02 LAB — PULMONARY FUNCTION TEST

## 2021-01-04 ENCOUNTER — Encounter: Payer: Medicare Other | Admitting: Internal Medicine

## 2021-01-07 ENCOUNTER — Ambulatory Visit: Payer: Medicare Other | Admitting: Cardiology

## 2021-01-11 ENCOUNTER — Encounter: Payer: Medicare Other | Admitting: Internal Medicine

## 2021-01-14 ENCOUNTER — Other Ambulatory Visit (HOSPITAL_COMMUNITY): Payer: No Typology Code available for payment source

## 2021-01-16 ENCOUNTER — Ambulatory Visit: Payer: Self-pay | Admitting: Urology

## 2021-01-18 ENCOUNTER — Ambulatory Visit (INDEPENDENT_AMBULATORY_CARE_PROVIDER_SITE_OTHER): Payer: Medicare Other

## 2021-01-18 DIAGNOSIS — I429 Cardiomyopathy, unspecified: Secondary | ICD-10-CM

## 2021-01-18 LAB — CUP PACEART REMOTE DEVICE CHECK
Battery Remaining Longevity: 67 mo
Battery Voltage: 2.99 V
Brady Statistic AP VP Percent: 7.2 %
Brady Statistic AP VS Percent: 0.01 %
Brady Statistic AS VP Percent: 92.32 %
Brady Statistic AS VS Percent: 0.47 %
Brady Statistic RA Percent Paced: 7.05 %
Brady Statistic RV Percent Paced: 97.96 %
Date Time Interrogation Session: 20221216044222
HighPow Impedance: 51 Ohm
HighPow Impedance: 63 Ohm
Implantable Lead Implant Date: 20081014
Implantable Lead Implant Date: 20081014
Implantable Lead Implant Date: 20081014
Implantable Lead Location: 753858
Implantable Lead Location: 753859
Implantable Lead Location: 753860
Implantable Lead Model: 4542
Implantable Lead Model: 5076
Implantable Lead Model: 6947
Implantable Lead Serial Number: 128499
Implantable Pulse Generator Implant Date: 20191025
Lead Channel Impedance Value: 418 Ohm
Lead Channel Impedance Value: 456 Ohm
Lead Channel Impedance Value: 513 Ohm
Lead Channel Impedance Value: 532 Ohm
Lead Channel Impedance Value: 532 Ohm
Lead Channel Impedance Value: 893 Ohm
Lead Channel Pacing Threshold Amplitude: 0.625 V
Lead Channel Pacing Threshold Amplitude: 0.75 V
Lead Channel Pacing Threshold Amplitude: 1 V
Lead Channel Pacing Threshold Pulse Width: 0.4 ms
Lead Channel Pacing Threshold Pulse Width: 0.4 ms
Lead Channel Pacing Threshold Pulse Width: 0.4 ms
Lead Channel Sensing Intrinsic Amplitude: 1.375 mV
Lead Channel Sensing Intrinsic Amplitude: 1.375 mV
Lead Channel Sensing Intrinsic Amplitude: 18.5 mV
Lead Channel Sensing Intrinsic Amplitude: 18.5 mV
Lead Channel Setting Pacing Amplitude: 1.25 V
Lead Channel Setting Pacing Amplitude: 1.5 V
Lead Channel Setting Pacing Amplitude: 2 V
Lead Channel Setting Pacing Pulse Width: 0.4 ms
Lead Channel Setting Pacing Pulse Width: 0.4 ms
Lead Channel Setting Sensing Sensitivity: 0.3 mV

## 2021-01-29 ENCOUNTER — Institutional Professional Consult (permissible substitution): Payer: No Typology Code available for payment source | Admitting: Internal Medicine

## 2021-01-30 NOTE — Progress Notes (Signed)
Remote ICD transmission.   

## 2021-02-08 ENCOUNTER — Ambulatory Visit: Payer: No Typology Code available for payment source | Admitting: Cardiology

## 2021-02-08 ENCOUNTER — Encounter: Payer: Self-pay | Admitting: Internal Medicine

## 2021-02-08 ENCOUNTER — Ambulatory Visit (INDEPENDENT_AMBULATORY_CARE_PROVIDER_SITE_OTHER): Payer: Medicare Other | Admitting: Internal Medicine

## 2021-02-08 VITALS — BP 190/119 | HR 70 | Ht 67.0 in | Wt 146.0 lb

## 2021-02-08 DIAGNOSIS — I10 Essential (primary) hypertension: Secondary | ICD-10-CM

## 2021-02-08 DIAGNOSIS — I5023 Acute on chronic systolic (congestive) heart failure: Secondary | ICD-10-CM

## 2021-02-08 DIAGNOSIS — I5022 Chronic systolic (congestive) heart failure: Secondary | ICD-10-CM | POA: Diagnosis not present

## 2021-02-08 MED ORDER — SACUBITRIL-VALSARTAN 24-26 MG PO TABS: 1.0000 | ORAL_TABLET | Freq: Two times a day (BID) | ORAL | 3 refills | Status: DC

## 2021-02-08 MED ORDER — FUROSEMIDE 40 MG PO TABS: 40.0000 mg | ORAL_TABLET | Freq: Every day | ORAL | 3 refills | Status: AC

## 2021-02-08 MED ORDER — SACUBITRIL-VALSARTAN 24-26 MG PO TABS: 1.0000 | ORAL_TABLET | Freq: Two times a day (BID) | ORAL | 0 refills | Status: DC

## 2021-02-08 NOTE — Patient Instructions (Signed)
Medication Instructions:  Increase Lasix to 40mg  twice a day x 3 days only, then 40mg  daily.  Begin Entresto 24/26mg  twice a day Continue all other medications.     Labwork: BMET, BNP - order given today Office will contact with results via phone or letter.     Testing/Procedures: none  Follow-Up: 1 year - Dr.  Rayann Heman  Keep already scheduled visit with Dr. Domenic Polite as planned.    Any Other Special Instructions Will Be Listed Below (If Applicable).   If you need a refill on your cardiac medications before your next appointment, please call your pharmacy.

## 2021-02-08 NOTE — Progress Notes (Signed)
PCP: [facility] Primary Cardiologist: Dr Domenic Polite Primary EP: Dr Rayann Heman  [name]. is a 81 y.o. male who presents today for routine electrophysiology followup.  Since last being seen in our clinic, the patient reports doing reasonably well.   He has been treated with XRT for lung CA.  He has recently had SOB.  + edema. Today, he denies symptoms of palpitations, chest pain,  dizziness, presyncope, syncope, or ICD shocks.  The patient is otherwise without complaint today.   Past Medical History:  Diagnosis Date   BPH (benign prostatic hyperplasia)    CAD (coronary artery disease)    Distal circumflex and OM disease with collaterals 2008   Cardiomyopathy    LVEF 25% up to 65%   COPD (chronic obstructive pulmonary disease) (HCC)    Dyslipidemia    Essential hypertension    Gout    History of alcohol abuse    ICD (implantable cardiac defibrillator) battery depletion    CRT-D device   LBBB (left bundle branch block)    Lung cancer (HCC)    Squamous cell carcinoma RLL lung   Lung nodule    [facility]   Migraines    Mitral regurgitation    Severe before CRT with annular dilatation   Orthostatic syncope    Past Surgical History:  Procedure Laterality Date   BI-VENTRICULAR IMPLANTABLE CARDIOVERTER DEFIBRILLATOR N/A 12/30/2011   Procedure: BI-VENTRICULAR IMPLANTABLE CARDIOVERTER DEFIBRILLATOR  (CRT-D);  Surgeon: Thompson Grayer, MD;  Location: [facility] CATH LAB;  Service: Cardiovascular;  Laterality: N/A;   CARDIAC DEFIBRILLATOR PLACEMENT  12/29/12   BiV ICD implanted, generator change 12/30/11 by Dr Rayann Heman MDT Protecta XT CRT-D   ORIF ANKLE FRACTURE Right 08/31/2014   Procedure: OPEN REDUCTION INTERNAL FIXATION (ORIF) ANKLE FRACTURE;  Surgeon: Marybelle Killings, MD;  Location: [facility];  Service: Orthopedics;  Laterality: Right;    ROS- all systems are reviewed and negative except as per HPI above  Current Outpatient Medications  Medication Sig Dispense Refill    acetaminophen (TYLENOL) 500 MG tablet Take 1,000 mg by mouth daily.     albuterol (PROVENTIL HFA;VENTOLIN HFA) 108 (90 Base) MCG/ACT inhaler Inhale 2 puffs into the lungs every 4 (four) hours as needed for wheezing or shortness of breath.     atorvastatin (LIPITOR) 80 MG tablet Take 80 mg by mouth daily.     busPIRone (BUSPAR) 10 MG tablet Take 5 mg by mouth 2 (two) times daily.      carbamide peroxide (DEBROX) 6.5 % otic solution Place 5 drops into both ears daily as needed (for ear vwax removal).     carvedilol (COREG) 25 MG tablet Take 12.5 mg by mouth 2 (two) times daily.     cetirizine (ZYRTEC) 10 MG tablet Take 10 mg by mouth daily.     Cholecalciferol (VITAMIN D3) 2000 units TABS Take 2 tablets by mouth daily.     diclofenac sodium (VOLTAREN) 1 % GEL Apply 2 g topically 4 (four) times daily as needed (painful joints).      divalproex (DEPAKOTE) 500 MG 24 hr tablet Take 1,500 mg by mouth at bedtime.     finasteride (PROSCAR) 5 MG tablet Take 5 mg by mouth daily.     folic acid (FOLVITE) 1 MG tablet Take 1 mg by mouth daily.     furosemide (LASIX) 40 MG tablet Take 0.5 tablets (20 mg total) by mouth daily as needed for fluid or edema (weight gain > 3lbs). [place]  tablet    gabapentin (NEURONTIN) 300 MG capsule Take 300-600 mg by mouth at bedtime.      hydroxypropyl methylcellulose (ISOPTO TEARS) 2.5 % ophthalmic solution Place 1 drop into both eyes 4 (four) times daily as needed for dry eyes.     Melatonin 10 MG TABS Take 1 tablet by mouth at bedtime.      Misc Natural Products (OSTEO BI-FLEX JOINT SHIELD PO) Take 2 tablets by mouth daily.     Multiple Vitamin (MULTIVITAMIN) tablet Take 1 tablet by mouth daily.     nitroGLYCERIN (NITROSTAT) 0.4 MG SL tablet Place 0.4 mg under the tongue every 5 (five) minutes as needed.     Omega-3 Fatty Acids (FISH OIL) 1000 MG CAPS Take 1 capsule by mouth daily.     omeprazole (PRILOSEC) 20 MG capsule Take 20 mg by mouth daily.     potassium chloride SA  (K-DUR,KLOR-CON) 20 MEQ tablet Take 1 tablet (20 mEq total) by mouth daily as needed (In the days when you take Lasix).     Tiotropium Bromide-Olodaterol (STIOLTO RESPIMAT) 2.5-2.5 MCG/ACT AERS Inhale 2 puffs into the lungs daily.     No current facility-administered medications for this visit.    Physical Exam: Vitals:   02/08/21 1513  BP: (!) 190/119  Pulse: 70  Weight: 146 lb (66.2 kg)  Height: 5\' 7"  (1.702 m)    GEN- The patient is dissheveled appearing, alert and oriented x 3 today.   Head- normocephalic, atraumatic Eyes-  Sclera clear, conjunctiva pink Ears- hearing intact Oropharynx- clear Lungs- bibasilar rales,  prolonged expiratory phase, normal work of breathing Chest- ICD pocket is well healed Heart- Regular rate and rhythm, no murmurs, rubs or gallops, PMI not laterally displaced GI- soft, NT, ND, + BS Extremities- no clubbing, cyanosis, + edema  ICD interrogation- reviewed in detail today,  See PACEART report  ekg tracing ordered today is personally reviewed and shows sinus with BiV pacing  Wt Readings from Last 3 Encounters:  02/08/21 146 lb (66.2 kg)  05/03/20 154 lb (69.9 kg)  03/06/20 164 lb (74.4 kg)    Assessment and Plan:  1.  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  Optivol markedly elevated as well. Ef has previously normalized with CRT Stable on an appropriate medical regimen Normal ICD function See Claudia Desanctis Art report he is not device dependant today I will enroll in ICM device clinic Bmet, BNP ordered Increase lasix to 40mg  BID x 3 days then 40mg  daily Sodium restriction Start entresto Will need close follow-up with Dr Domenic Polite  2. HTN Very elevated today Start entresto Increase lasix As above  3. Tobacco Cessation advised  Return in a year for EP care He is scheduled to see Dr Domenic Polite in March  Thompson Grayer MD, [facility] 02/08/2021 3:30 PM

## 2021-02-08 NOTE — Addendum Note (Signed)
Addended by: Laurine Blazer on: 02/08/2021 04:29 PM   Modules accepted: Orders

## 2021-02-11 ENCOUNTER — Telehealth: Payer: Self-pay | Admitting: Internal Medicine

## 2021-02-11 NOTE — Telephone Encounter (Signed)
Is this something that precert can address ?

## 2021-02-11 NOTE — Telephone Encounter (Signed)
[facility] states [facility] is [facility] for patient to have lab work. He states he has an order to have it done at [facility], but [place] will not cover without verbal authorization. Patient provided a # for the [place], [phone]

## 2021-02-12 ENCOUNTER — Telehealth: Payer: Self-pay | Admitting: Internal Medicine

## 2021-02-12 NOTE — Telephone Encounter (Signed)
Pt c/o medication issue:  1. Name of Medication: sacubitril-valsartan (ENTRESTO) 24-26 MG  2. How are you currently taking this medication (dosage and times per day)? Take 1 tablet by mouth 2 (two) times daily.  3. Are you having a reaction (difficulty breathing--STAT)? No   4. What is your medication issue? Patient states he got dizzy and fell yesterday.  It was the first time he took the medication.  It was after the 2nd time he took the pill.  He states he got extremely dizzy.  He said he wasn't going to take anymore of the medication.

## 2021-02-13 NOTE — Telephone Encounter (Signed)
Call back received from Pt.  Per Pt he started the [place].  He took a morning dose.  Then he took the PM dose later that day.    He states he took his Estevan Ryder outside for a walk when he became dizzy.  He was so dizzy he collapsed.  He states he had to sit on the ground for 10 minutes before he could gather himself and get back up and go inside.  He states he is not taking any more Entresto.  Pt has a BP cuff at home and monitors his BP regularly.  He states the last time he checked at home his BP was 120/80.  Advised Pt to NOT resume Entresto at this time.  Advised would send to Dr. Rayann Heman and call Pt back if he advised any change in plan.  Pt thanked nurse for call back.

## 2021-02-13 NOTE — Telephone Encounter (Signed)
Left message for Pt requesting call back.

## 2021-02-13 NOTE — Telephone Encounter (Signed)
I agree with stopping entresto. He has scheduled follow-up with Dr Domenic Polite for further CHF management already scheduled.

## 2021-03-06 ENCOUNTER — Ambulatory Visit (HOSPITAL_COMMUNITY)
Admission: RE | Admit: 2021-03-06 | Discharge: 2021-03-06 | Disposition: A | Discharge status: Home / Self Care | Payer: No Typology Code available for payment source | Source: Ambulatory Visit | Attending: Urology | Admitting: Urology

## 2021-03-06 ENCOUNTER — Other Ambulatory Visit: Payer: Self-pay

## 2021-03-06 ENCOUNTER — Encounter (HOSPITAL_COMMUNITY): Payer: Self-pay

## 2021-03-06 DIAGNOSIS — C3431 Malignant neoplasm of lower lobe, right bronchus or lung: Secondary | ICD-10-CM | POA: Insufficient documentation

## 2021-03-07 ENCOUNTER — Telehealth: Payer: Self-pay | Admitting: *Deleted

## 2021-03-07 NOTE — Telephone Encounter (Signed)
CALLED PATIENT TO INFORM THAT ASHLYN [place] WILL CALL HIM ON 03-08-21 TO GIVE CT RESULTS FROM 03-06-21, SPOKE WITH PATIENT AND HE IS GOOD WITH THIS

## 2021-03-08 ENCOUNTER — Ambulatory Visit
Admission: RE | Admit: 2021-03-08 | Discharge: 2021-03-08 | Disposition: A | Discharge status: Home / Self Care | Payer: No Typology Code available for payment source | Source: Ambulatory Visit | Attending: Urology | Admitting: Urology

## 2021-03-08 ENCOUNTER — Encounter: Payer: Self-pay | Admitting: Urology

## 2021-03-08 DIAGNOSIS — C3431 Malignant neoplasm of lower lobe, right bronchus or lung: Secondary | ICD-10-CM

## 2021-03-08 NOTE — Progress Notes (Addendum)
Radiation Oncology         [phone]) [phone] ________________________________  Name: Paul [name]. 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  The patient was notified in advance and gave permission to proceed with this visit format. He tolerated his treatment well without any ill side effects and remains without complaints.   He had a recent follow up CT chest on 03/06/21 which shows that the treated lesion in the right lower lobe is stable with radiation changes only and the small 7 mm nodule in the left upper lobe is stable and no evidence of disease progression or recurrence.                          On review of systems, the patient states that he is doing well in general.  He specifically denies chest pain, productive cough, hemoptysis, fever, chills or night sweats.  He has COPD and has noticed some increased shortness of breath with exertion over the past several months so he is scheduled for consult with [facility] pulmonology in [place] on 03/18/21 for further evaluation and management. He reports a healthy appetite and is maintaining his weight.  He denies abdominal pain, nausea, vomiting, diarrhea or constipation.  Overall, he is pleased with his progress to date.  ALLERGIES:  is allergic to codeine, morphine sulfate, and mupirocin.  Meds: Current Outpatient Medications  Medication Sig Dispense Refill   acetaminophen (TYLENOL) 500 MG tablet Take  1,000 mg by mouth daily.     albuterol (PROVENTIL HFA;VENTOLIN HFA) 108 (90 Base) MCG/ACT inhaler Inhale 2 puffs into the lungs every 4 (four) hours as needed for wheezing or shortness of breath.     atorvastatin (LIPITOR) 80 MG tablet Take 80 mg by mouth daily.     busPIRone (BUSPAR) 10 MG tablet Take 5 mg by mouth 2 (two) times daily.      carbamide peroxide (DEBROX) 6.5 % otic solution Place 5 drops into both ears daily as needed (for ear vwax removal).     carvedilol (COREG) 25 MG tablet Take 12.5 mg by mouth 2 (two) times daily.     cetirizine (ZYRTEC) 10 MG tablet Take 10 mg by mouth daily.     Cholecalciferol (VITAMIN D3) 2000 units TABS Take 2 tablets by mouth daily.     diclofenac sodium (VOLTAREN) 1 % GEL Apply 2 g topically 4 (four) times daily as needed (painful joints).      divalproex (DEPAKOTE) 500 MG 24 hr tablet Take 1,500 mg by mouth at bedtime.     finasteride (PROSCAR) 5 MG tablet Take 5 mg by mouth daily.     folic acid (FOLVITE) 1 MG tablet Take 1 mg by mouth daily.     furosemide (LASIX) 40 MG tablet Take 1 tablet (40 mg total) by mouth daily. 90 tablet 3   gabapentin (NEURONTIN) 300 MG capsule Take 300-600 mg  by mouth at bedtime.      hydroxypropyl methylcellulose (ISOPTO TEARS) 2.5 % ophthalmic solution Place 1 drop into both eyes 4 (four) times daily as needed for dry eyes.     Melatonin 10 MG TABS Take 1 tablet by mouth at bedtime.      Misc Natural Products (OSTEO BI-FLEX JOINT SHIELD PO) Take 2 tablets by mouth daily.     Multiple Vitamin (MULTIVITAMIN) tablet Take 1 tablet by mouth daily.     nitroGLYCERIN (NITROSTAT) 0.4 MG SL tablet Place 0.4 mg under the tongue every 5 (five) minutes as needed.     Omega-3 Fatty Acids (FISH OIL) 1000 MG CAPS Take 1 capsule by mouth daily.     omeprazole (PRILOSEC) 20 MG capsule Take 20 mg by mouth daily.     potassium chloride SA (K-DUR,KLOR-CON) 20 MEQ tablet Take 1 tablet (20 mEq total) by mouth daily as needed (In the days  when you take Lasix).     sacubitril-valsartan (ENTRESTO) 24-26 MG Take 1 tablet by mouth 2 (two) times daily. 28 tablet 0   Tiotropium Bromide-Olodaterol (STIOLTO RESPIMAT) 2.5-2.5 MCG/ACT AERS Inhale 2 puffs into the lungs daily.     No current facility-administered medications for this encounter.    Physical Findings:  vitals were not taken for this visit.  Pain Assessment Pain Score: 0-No pain/10 Unable to assess due to telephone follow-up visit format.  Lab Findings: Lab Results  Component Value Date   WBC 6.3 12/22/2019   HGB 12.2 (L) 12/22/2019   HCT 38.3 (L) 12/22/2019   MCV 93.9 12/22/2019   PLT 199 12/22/2019     Radiographic Findings: CT Chest Wo Contrast  Result Date: 03/07/2021 CLINICAL DATA:  Non-small-cell lung cancer, assess treatment response. EXAM: CT CHEST WITHOUT CONTRAST TECHNIQUE: Multidetector CT imaging of the chest was performed following the standard protocol without IV contrast. RADIATION DOSE REDUCTION: This exam was performed according to the departmental dose-optimization program which includes automated exposure control, adjustment of the mA and/or kV according to patient size and/or use of iterative reconstruction technique. COMPARISON:  09/12/2020. FINDINGS: Cardiovascular: The heart is normal in size and there is no pericardial effusion. Multi-vessel coronary artery calcifications are noted. 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 Atherosclerotic calcification of the aorta without evidence of aneurysm. The pulmonary trunk is normal in caliber. Mediastinum/Nodes: No mediastinal or axillary lymphadenopathy. Evaluation of the hila is limited due to lack of IV contrast. The thyroid gland, trachea, and esophagus are within normal limits. Lungs/Pleura: Emphysematous changes are present in the lungs and apical pleural scarring is noted on the right. A subpleural nodular opacity with surrounding fat stranding is noted in the left lower  lobe measuring 2.0 x 1.5 x 1.0 cm, axial image 94. There is a subpleural nodule in the left upper lobe measuring 7 mm, axial image 116. Subpleural interstitial fibrosis is noted bilaterally. Diffuse bronchial wall thickening is noted. No effusion or pneumothorax is seen. Upper Abdomen: No acute abnormality. Musculoskeletal: Degenerative changes are present in the thoracic spine. No acute or suspicious osseous abnormality. IMPRESSION: 1. Right lower lobe pulmonary nodule measuring 2.0 x 1.5 x 1.0 cm, not significantly changed from the prior exam and may represent patient's known non-small cell lung cancer. 2. Stable subpleural left upper lobe pulmonary nodule measuring 7 mm. No new pulmonary nodule is identified. 3. Emphysema. 4. Aortic atherosclerosis and coronary artery calcifications. Electronically Signed   By: Brett Fairy M.D.   On: 03/07/2021 01:32  Impression/Plan: 39. 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 He has recovered well from the effects of his SBRT and remains without complaints.  We reviewed his recent CT chest findings which overall appear stable.  Therefore, we will proceed with serial CT chest imaging at [facility] every 6 months to continue to monitor closely for any evidence of disease progression or recurrence.  He does have to make arrangements for transportation since he does not drive himself so we will make sure he has ample notification prior to each scan. He knows that he is welcome to call at anytime in the interim with any questions or concerns related to his previous radiation.  He appears to have a good understanding of our recommendations and is comfortable and in agreement with the stated plan. 2. COPD. Patient having some increased shortness of breath with activity and is scheduled for a visit with a pulmonologist at [facility] pulmonary in [facility] 03/18/21. We will continue to follow  expectantly.    [name], PA-C

## 2021-03-08 NOTE — Progress Notes (Signed)
Spoke w/ patient, verified identity, and began nursing interview. Patient states "He is having increasing shortness of breath w/ his HX of COPD and would like to obtain a RX for an at-home oxygen machine." No other symptoms reported at this time.  Meaningful use complete.  Patient notified of his 10:30am-03/08/21 telephone appointment w/ Ashlyn Bruning PA-C. I left my extension [phone] in case patient needs to call. Patient verbalized understanding of information.  Patient contact [phone]

## 2021-03-13 ENCOUNTER — Telehealth: Payer: Self-pay

## 2021-03-13 NOTE — Telephone Encounter (Signed)
Referred to ICM clinic by Dr Rayann Heman.   Spoke with patient and agreeable to restart monthly follow up.  Provided ICM direct number and explained should call if experiencing any fluid symptoms such as weight gain, shortness of breath or extremity/abdominal swelling.  1st ICM remote transmission scheduled for 04/01/2021.

## 2021-03-13 NOTE — Telephone Encounter (Signed)
-----   Message from Thompson Grayer, MD sent at 02/08/2021  3:36 PM EST ----- He would like to be followed by you again. Optivol up recently. I will get labs today   His best number for contact is:  [phone] (home).   He doesn't keep him cell on that much.

## 2021-03-18 ENCOUNTER — Ambulatory Visit (INDEPENDENT_AMBULATORY_CARE_PROVIDER_SITE_OTHER): Payer: No Typology Code available for payment source | Admitting: Internal Medicine

## 2021-03-18 ENCOUNTER — Encounter: Payer: Self-pay | Admitting: Internal Medicine

## 2021-03-18 ENCOUNTER — Other Ambulatory Visit: Payer: Self-pay

## 2021-03-18 DIAGNOSIS — J449 Chronic obstructive pulmonary disease, unspecified: Secondary | ICD-10-CM | POA: Diagnosis not present

## 2021-03-18 MED ORDER — STIOLTO RESPIMAT 2.5-2.5 MCG/ACT IN AERS: 2.0000 | INHALATION_SPRAY | Freq: Every day | RESPIRATORY_TRACT | 0 refills | Status: DC

## 2021-03-18 NOTE — Assessment & Plan Note (Addendum)
Quit smoking 2021 p onset of symptoms of doe around 2018  - 03/18/2021  After extensive coaching inhaler device,  effectiveness =    75%  (very short breath hold) > continue stiolto 2 puffs each am  - Labs ordered 03/18/2021  :  allergy screen   alpha one AT phenotype   - 03/18/2021   patient walked at a slow pace on room air. SOB p 300 ft but completed 450 ft with lowest sats 94%   Pt is Group B in terms of symptom/risk and laba/lama therefore appropriate rx at this point >>>  stiolto appropriate for now pending pfts with prn  saba  Re SABA :  I spent extra time with pt today reviewing appropriate use of albuterol for prn use on exertion with the following points: 1) saba is for relief of sob that does not improve by walking a slower pace or resting but rather if the pt does not improve after trying this first. 2) If the pt is convinced, as many are, that saba helps recover from activity faster then it's easy to tell if this is the case by re-challenging : ie stop, take the inhaler, then p 5 minutes try the exact same activity (intensity of workload) that just caused the symptoms and see if they are substantially diminished or not after saba 3) if there is an activity that reproducibly causes the symptoms, try the saba 15 min before the activity on alternate days   If in fact the saba really does help, then fine to continue to use it prn but advised may need to look closer at the maintenance regimen being used to achieve better control of airways disease with exertion.   Each maintenance medication was reviewed in detail including emphasizing most importantly the difference between maintenance and prns and under what circumstances the prns are to be triggered using an action plan format where appropriate.  Total time for H and P, chart review, counseling, reviewing hfa/SMI(respimat)  device(s) , directly observing portions of ambulatory 02 saturation study/ and generating customized AVS unique to this  initial office visit / same day charting = 47 min

## 2021-03-18 NOTE — Patient Instructions (Addendum)
Plan A = Automatic = Always=    stiolto 2 puffs each am   Work on inhaler technique:  relax and gently blow all the way out then take a nice smooth full deep breath back in, triggering the inhaler at same time you start breathing in.  Hold for uat least  5 seconds if you can. Blow out thru nose. Rinse and gargle with water when done.  If mouth or throat bother you at all,  try brushing teeth/gums/tongue with arm and hammer toothpaste/ make a slurry and gargle and spit out.      Plan B = Backup (to supplement plan A, not to replace it) Only use your albuterol inhaler as a rescue medication to be used if you can't catch your breath by resting or doing a relaxed purse lip breathing pattern.  - The less you use it, the better it will work when you need it. - Ok to use the inhaler up to 2 puffs  every 4 hours if you must but call for appointment if use goes up over your usual need - Don't leave home without it !!  (think of it like the spare tire for your car)   Ok to try albuterol 15 min before an activity (on alternating days)  that you know would usually make you short of breath and see if it makes any difference and if makes none then don't take albuterol after activity unless you can't catch your breath as this means it's the resting that helps, not the albuterol.   PFTs next available   Please schedule a follow up visit in 3 months but call sooner if needed

## 2021-03-18 NOTE — Progress Notes (Signed)
Paul [name]., male    DOB: November 06, 1940,    MRN: [id number]   Brief patient profile:  22  yowm  quit smoking 11/2019  referred to pulmonary clinic in [facility]  03/18/2021 by Herold Harms / PCP at [facility]  for copd evaluation    Finished RT 2020 in [place]  [facility]    History of Present Illness  03/18/2021  Pulmonary/ 1st office eval/ Velmer Broadfoot / [place] on stiolto but hfa poor  Chief Complaint  Patient presents with   Consult    Hx of COPD and lung cancer. Would like to discuss [employer], patient is not currently on oxygen   Dyspnea:  onset was around 2018 gradually worse until quit smoking then some better  Quit walking to mailbox several years pta 300 ft and has to stop while walking back to house  Cough: none Sleep: bed is flat, on side, one pillow  SABA use: way too much   No obvious day to day or daytime variability or assoc excess/ purulent sputum or mucus plugs or hemoptysis or cp or chest tightness, subjective wheeze or overt sinus or hb symptoms.   Sleeping  without nocturnal  or early am exacerbation  of respiratory  c/o's or need for noct saba. Also denies any obvious fluctuation of symptoms with weather or environmental changes or other aggravating or alleviating factors except as outlined above   No unusual exposure hx or h/o childhood pna/ asthma or knowledge of premature birth.  Current Allergies, Complete Past Medical History, Past Surgical History, Family History, and Social History were reviewed in [employer] record.  ROS  The following are not active complaints unless bolded Hoarseness, sore throat, dysphagia, dental problems, itching, sneezing,  nasal congestion or discharge of excess mucus or purulent secretions, ear ache,   fever, chills, sweats, unintended wt loss or wt gain, classically pleuritic or exertional cp,  orthopnea pnd or arm/hand swelling  or leg swelling, presyncope, palpitations, abdominal pain, anorexia, nausea, vomiting,  diarrhea  or change in bowel habits or change in bladder habits, change in stools or change in urine, dysuria, hematuria,  rash, arthralgias, visual complaints, headache, numbness, weakness or ataxia or problems with walking or coordination,  change in mood or  memory.           Past Medical History:  Diagnosis Date   BPH (benign prostatic hyperplasia)    CAD (coronary artery disease)    Distal circumflex and OM disease with collaterals 2008   Cardiomyopathy    LVEF 25% up to 65%   COPD (chronic obstructive pulmonary disease) (HCC)    Dyslipidemia    Essential hypertension    Gout    History of alcohol abuse    ICD (implantable cardiac defibrillator) battery depletion    CRT-D device   LBBB (left bundle branch block)    Lung cancer (HCC)    Squamous cell carcinoma RLL lung   Lung nodule    [facility]   Migraines    Mitral regurgitation    Severe before CRT with annular dilatation   Orthostatic syncope     Outpatient Medications Prior to Visit  Medication Sig Dispense Refill   acetaminophen (TYLENOL) 500 MG tablet Take 1,000 mg by mouth daily.     albuterol (PROVENTIL HFA;VENTOLIN HFA) 108 (90 Base) MCG/ACT inhaler Inhale 2 puffs into the lungs every 4 (four) hours as needed for wheezing or shortness of breath.     atorvastatin (LIPITOR)  80 MG tablet Take 80 mg by mouth daily.     busPIRone (BUSPAR) 10 MG tablet Take 5 mg by mouth 2 (two) times daily.      carbamide peroxide (DEBROX) 6.5 % otic solution Place 5 drops into both ears daily as needed (for ear vwax removal).     carvedilol (COREG) 25 MG tablet Take 12.5 mg by mouth 2 (two) times daily.     cetirizine (ZYRTEC) 10 MG tablet Take 10 mg by mouth daily.     Cholecalciferol (VITAMIN D3) 2000 units TABS Take 2 tablets by mouth daily.     diclofenac sodium (VOLTAREN) 1 % GEL Apply 2 g topically 4 (four) times daily as needed (painful joints).      divalproex (DEPAKOTE) 500 MG 24 hr tablet Take 1,500 mg by mouth at  bedtime.     finasteride (PROSCAR) 5 MG tablet Take 5 mg by mouth daily.     folic acid (FOLVITE) 1 MG tablet Take 1 mg by mouth daily.     furosemide (LASIX) 40 MG tablet Take 1 tablet (40 mg total) by mouth daily. 90 tablet 3   gabapentin (NEURONTIN) 300 MG capsule Take 300-600 mg by mouth at bedtime.      hydroxypropyl methylcellulose (ISOPTO TEARS) 2.5 % ophthalmic solution Place 1 drop into both eyes 4 (four) times daily as needed for dry eyes.     Melatonin 10 MG TABS Take 1 tablet by mouth at bedtime.      Misc Natural Products (OSTEO BI-FLEX JOINT SHIELD PO) Take 2 tablets by mouth daily.     Multiple Vitamin (MULTIVITAMIN) tablet Take 1 tablet by mouth daily.     nitroGLYCERIN (NITROSTAT) 0.4 MG SL tablet Place 0.4 mg under the tongue every 5 (five) minutes as needed.     Omega-3 Fatty Acids (FISH OIL) 1000 MG CAPS Take 1 capsule by mouth daily.     omeprazole (PRILOSEC) 20 MG capsule Take 20 mg by mouth daily.     potassium chloride SA (K-DUR,KLOR-CON) 20 MEQ tablet Take 1 tablet (20 mEq total) by mouth daily as needed (In the days when you take Lasix).     sacubitril-valsartan (ENTRESTO) 24-26 MG Take 1 tablet by mouth 2 (two) times daily. 28 tablet 0   Tiotropium Bromide-Olodaterol (STIOLTO RESPIMAT) 2.5-2.5 MCG/ACT AERS Inhale 2 puffs into the lungs daily.     No facility-administered medications prior to visit.     Objective:     BP 132/78 (BP Location: Left Arm, Patient Position: Sitting)    Pulse 78    Temp 98.4 F (36.9 C) (Temporal)    Ht 5\' 7"  (1.702 m)    Wt 138 lb 0.6 oz (62.6 kg)    SpO2 97% Comment: ra   BMI 21.62 kg/m   SpO2: 97 % (ra)  Amb wm nad lots of upper airway noise   HEENT : pt wearing mask not removed for exam due to covid - 19 concerns.   NECK :  without JVD/Nodes/TM/ nl carotid upstrokes bilaterally   LUNGS: no acc muscle use,  Min barrel  contour chest wall with bilateral  slightly decreased bs s audible wheeze and  without cough on insp or exp  maneuvers and min  Hyperresonant  to  percussion bilaterally     CV:  RRR  no s3 or murmur or increase in P2, and no edema   ABD:  soft and nontender with pos end  insp Hoover's  in the supine position. No  bruits or organomegaly appreciated, bowel sounds nl  MS:   Nl gait/  ext warm without deformities, calf tenderness, cyanosis or clubbing No obvious joint restrictions   SKIN: warm and dry without lesions    NEURO:  alert, approp, nl sensorium with  no motor or cerebellar deficits apparent.        I personally reviewed images and agree with radiology impression as follows:   Chest CT w/o contrast 03/06/21 1. Right lower lobe pulmonary nodule measuring 2.0 x 1.5 x 1.0 cm, not significantly changed from the prior exam and may represent patient's known non-small cell lung cancer. 2. Stable subpleural left upper lobe pulmonary nodule measuring 7 mm. No new pulmonary nodule is identified. 3. Emphysema. 4. Aortic atherosclerosis and coronary artery calcifications.    Assessment   COPD GOLD ?  Quit smoking 2021 p onset of symptoms of doe around 2018  - 03/18/2021  After extensive coaching inhaler device,  effectiveness =    75%  (very short breath hold) > continue stiolto 2 puffs each am  - Labs ordered 03/18/2021  :  allergy screen   alpha one AT phenotype   - 03/18/2021   patient walked at a slow pace on room air. SOB p 300 ft but completed 450 ft with lowest sats 94%   Pt is Group B in terms of symptom/risk and laba/lama therefore appropriate rx at this point >>>  stiolto appropriate for now pending pfts with prn  saba  Re SABA :  I spent extra time with pt today reviewing appropriate use of albuterol for prn use on exertion with the following points: 1) saba is for relief of sob that does not improve by walking a slower pace or resting but rather if the pt does not improve after trying this first. 2) If the pt is convinced, as many are, that saba helps recover from activity faster then  it's easy to tell if this is the case by re-challenging : ie stop, take the inhaler, then p 5 minutes try the exact same activity (intensity of workload) that just caused the symptoms and see if they are substantially diminished or not after saba 3) if there is an activity that reproducibly causes the symptoms, try the saba 15 min before the activity on alternate days   If in fact the saba really does help, then fine to continue to use it prn but advised may need to look closer at the maintenance regimen being used to achieve better control of airways disease with exertion.   Each maintenance medication was reviewed in detail including emphasizing most importantly the difference between maintenance and prns and under what circumstances the prns are to be triggered using an action plan format where appropriate.  Total time for H and P, chart review, counseling, reviewing hfa/SMI(respimat)  device(s) , directly observing portions of ambulatory 02 saturation study/ and generating customized AVS unique to this initial office visit / same day charting = 47 min                       Christinia Gully, MD 03/18/2021

## 2021-03-20 ENCOUNTER — Encounter: Payer: Self-pay | Admitting: *Deleted

## 2021-03-23 LAB — CBC WITH DIFFERENTIAL/PLATELET
Basophils Absolute: 0 10*3/uL (ref 0.0–0.2)
Basos: 1 %
EOS (ABSOLUTE): 0.1 10*3/uL (ref 0.0–0.4)
Eos: 1 %
Hematocrit: 45.3 % (ref 37.5–51.0)
Hemoglobin: 15.3 g/dL (ref 13.0–17.7)
Immature Grans (Abs): 0 10*3/uL (ref 0.0–0.1)
Immature Granulocytes: 0 %
Lymphocytes Absolute: 1.5 10*3/uL (ref 0.7–3.1)
Lymphs: 18 %
MCH: 28.9 pg (ref 26.6–33.0)
MCHC: 33.8 g/dL (ref 31.5–35.7)
MCV: 86 fL (ref 79–97)
Monocytes Absolute: 0.5 10*3/uL (ref 0.1–0.9)
Monocytes: 6 %
Neutrophils Absolute: 6.1 10*3/uL (ref 1.4–7.0)
Neutrophils: 74 %
Platelets: 244 10*3/uL (ref 150–450)
RBC: 5.29 x10E6/uL (ref 4.14–5.80)
RDW: 13.9 % (ref 11.6–15.4)
WBC: 8.2 10*3/uL (ref 3.4–10.8)

## 2021-03-23 LAB — ALPHA-1-ANTITRYPSIN PHENOTYP: A-1 Antitrypsin: 182 mg/dL (ref 101–187)

## 2021-03-23 LAB — BASIC METABOLIC PANEL
BUN/Creatinine Ratio: 10 (ref 10–24)
BUN: 9 mg/dL (ref 8–27)
CO2: 18 mmol/L — ABNORMAL LOW (ref 20–29)
Calcium: 10.4 mg/dL — ABNORMAL HIGH (ref 8.6–10.2)
Chloride: 100 mmol/L (ref 96–106)
Creatinine, Ser: 0.93 mg/dL (ref 0.76–1.27)
Glucose: 109 mg/dL — ABNORMAL HIGH (ref 70–99)
Potassium: 4.5 mmol/L (ref 3.5–5.2)
Sodium: 138 mmol/L (ref 134–144)
eGFR: 83 mL/min/{1.73_m2} (ref >59)

## 2021-03-23 LAB — IGE: IgE (Immunoglobulin E), Serum: 11 IU/mL (ref 6–495)

## 2021-03-23 LAB — BRAIN NATRIURETIC PEPTIDE: BNP: 55.2 pg/mL (ref 0.0–100.0)

## 2021-03-25 ENCOUNTER — Other Ambulatory Visit: Payer: Self-pay

## 2021-03-25 DIAGNOSIS — J449 Chronic obstructive pulmonary disease, unspecified: Secondary | ICD-10-CM

## 2021-04-01 ENCOUNTER — Ambulatory Visit (INDEPENDENT_AMBULATORY_CARE_PROVIDER_SITE_OTHER): Payer: Medicare Other

## 2021-04-01 ENCOUNTER — Telehealth: Payer: Self-pay

## 2021-04-01 DIAGNOSIS — Z95810 Presence of automatic (implantable) cardiac defibrillator: Secondary | ICD-10-CM | POA: Diagnosis not present

## 2021-04-01 DIAGNOSIS — I5022 Chronic systolic (congestive) heart failure: Secondary | ICD-10-CM | POA: Diagnosis not present

## 2021-04-01 DIAGNOSIS — Z20822 Contact with and (suspected) exposure to covid-19: Secondary | ICD-10-CM | POA: Diagnosis not present

## 2021-04-01 NOTE — Telephone Encounter (Signed)
Remote ICM transmission received.  Attempted call to patient regarding ICM remote transmission and left detailed message per DPR.  Advised to return call for any fluid symptoms or questions. Next ICM remote transmission scheduled 04/15/2021.

## 2021-04-01 NOTE — Progress Notes (Signed)
EPIC Encounter for ICM Monitoring  Patient Name: Paul [name]. is a 81 y.o. male Date: 04/01/2021 Primary Care Physican: [place] Primary Cardiologist: Domenic Polite Electrophysiologist: [facility] Bi-V Pacing: 98.4%  02/08/2021 Office Weight: 146 lbs        Pt previously in ICM monitoring and now returns to monthly follow up (12/25/2017 was last ICM follow).  Attempted call to patient and unable to reach.  Left message per DPR to return call. Transmission reviewed.    Optivol thoracic impedance suggesting possible fluid accumulation starting 03/24/2021 and returning close to baseline.   Prescribed:  Furosemide 40 mg take 1 tablet (40 mg total) by mouth daily. Potassium 20 mEq Take 1 tablet (20 mEq total) by mouth daily as needed (In the days when you take Lasix).  Labs: 03/18/2021 Creatinine 0.93, BUN 9, Potassium 4.5, Sodium 138, GFR 83 A complete set of results can be found in Results Review.  Recommendations: Left voice mail with ICM number and encouraged to call if experiencing any fluid symptoms.  Follow-up plan: ICM clinic phone appointment on 04/15/2021 to recheck fluid levels.   91 day device clinic remote transmission 04/19/2021.    EP/Cardiology Office Visits: 04/03/2021 with Dr. Domenic Polite.    Copy of ICM check sent to Dr. Rayann Heman. Copy sent to Dr Domenic Polite for 04/03/2021 office visit.   3 month ICM trend: 04/01/2021.    12-14 Month ICM trend:     [name], RN 04/01/2021 12:43 PM

## 2021-04-03 ENCOUNTER — Encounter: Payer: Self-pay | Admitting: Cardiology

## 2021-04-03 ENCOUNTER — Ambulatory Visit (INDEPENDENT_AMBULATORY_CARE_PROVIDER_SITE_OTHER): Payer: No Typology Code available for payment source | Admitting: Cardiology

## 2021-04-03 VITALS — BP 142/80 | HR 67 | Ht 67.0 in | Wt 137.2 lb

## 2021-04-03 DIAGNOSIS — Z8679 Personal history of other diseases of the circulatory system: Secondary | ICD-10-CM

## 2021-04-03 DIAGNOSIS — I429 Cardiomyopathy, unspecified: Secondary | ICD-10-CM

## 2021-04-03 DIAGNOSIS — I447 Left bundle-branch block, unspecified: Secondary | ICD-10-CM

## 2021-04-03 DIAGNOSIS — I25119 Atherosclerotic heart disease of native coronary artery with unspecified angina pectoris: Secondary | ICD-10-CM | POA: Diagnosis not present

## 2021-04-03 DIAGNOSIS — I10 Essential (primary) hypertension: Secondary | ICD-10-CM

## 2021-04-03 NOTE — Patient Instructions (Addendum)
Medication Instructions:  ?Your physician recommends that you continue on your current medications as directed. Please refer to the Current Medication list given to you today.  ? ?Labwork: ?none ? ?Testing/Procedures: ?Your physician has requested that you have an echocardiogram. Echocardiography is a painless test that uses sound waves to create images of your heart. It provides your doctor with information about the size and shape of your heart and how well your heart?s chambers and valves are working. This procedure takes approximately one hour. There are no restrictions for this procedure.  ? ?Follow-Up: ?Your physician recommends that you schedule a follow-up appointment in: 1 year ? ?Any Other Special Instructions Will Be Listed Below (If Applicable). ? ?If you need a refill on your cardiac medications before your next appointment, please call your pharmacy. ?

## 2021-04-03 NOTE — Progress Notes (Signed)
Cardiology Office Note  Date: 04/03/2021   ID: Paul [name]., DOB Oct 24, 1940, MRN [id number]  PCP:  [facility]  Cardiologist:  Rozann Lesches, MD Electrophysiologist:  Thompson Grayer, MD   Chief Complaint  Patient presents with   Cardiac follow-up    History of Present Illness: Paul [name]. is an 81 y.o. male that I last saw in April 2021.  He has had interval follow-up with Dr. Rayann Heman including January of this year.  He presents overdue for follow-up.  He reports NYHA class II-III dyspnea which is chronic.  Also has COPD and is following with Dr. Melvyn Novas.  He uses supplemental oxygen at home.  He has a Medtronic biventricular ICD in place.  Device check in December 2022 revealed normal function with two brief episodes of NSVT.  Recent thoracic impedance indicated episode of fluid retention but returned toward baseline.  His weight has been stable.  His last echocardiogram was in May 2021 at which point LVEF was in the range of 55 to 60%.  We discussed getting a follow-up study.  I did go over his current medications but are noted below.  He also had recent lab work.  Past Medical History:  Diagnosis Date   BPH (benign prostatic hyperplasia)    CAD (coronary artery disease)    Distal circumflex and OM disease with collaterals 2008   Cardiomyopathy    LVEF 25% up to 65%   COPD (chronic obstructive pulmonary disease) (HCC)    Dyslipidemia    Essential hypertension    Gout    History of alcohol abuse    ICD (implantable cardiac defibrillator) battery depletion    CRT-D device   LBBB (left bundle branch block)    Lung cancer (HCC)    Squamous cell carcinoma RLL lung   Lung nodule    [facility]   Migraines    Mitral regurgitation    Severe before CRT with annular dilatation   Orthostatic syncope     Past Surgical History:  Procedure Laterality Date   BI-VENTRICULAR IMPLANTABLE CARDIOVERTER DEFIBRILLATOR N/A 12/30/2011   Procedure: BI-VENTRICULAR  IMPLANTABLE CARDIOVERTER DEFIBRILLATOR  (CRT-D);  Surgeon: Thompson Grayer, MD;  Location: [facility] CATH LAB;  Service: Cardiovascular;  Laterality: N/A;   CARDIAC DEFIBRILLATOR PLACEMENT  12/29/12   BiV ICD implanted, generator change 12/30/11 by Dr Rayann Heman MDT Protecta XT CRT-D   ORIF ANKLE FRACTURE Right 08/31/2014   Procedure: OPEN REDUCTION INTERNAL FIXATION (ORIF) ANKLE FRACTURE;  Surgeon: Marybelle Killings, MD;  Location: [place];  Service: Orthopedics;  Laterality: Right;    Current Outpatient Medications  Medication Sig Dispense Refill   acetaminophen (TYLENOL) 500 MG tablet Take 1,000 mg by mouth daily.     albuterol (PROVENTIL HFA;VENTOLIN HFA) 108 (90 Base) MCG/ACT inhaler Inhale 2 puffs into the lungs every 4 (four) hours as needed for wheezing or shortness of breath.     atorvastatin (LIPITOR) 80 MG tablet Take 80 mg by mouth daily.     busPIRone (BUSPAR) 10 MG tablet Take 5 mg by mouth 2 (two) times daily.      carbamide peroxide (DEBROX) 6.5 % otic solution Place 5 drops into both ears daily as needed (for ear vwax removal).     carvedilol (COREG) 25 MG tablet Take 12.5 mg by mouth 2 (two) times daily.     cetirizine (ZYRTEC) 10 MG tablet Take 10 mg by mouth daily.     Cholecalciferol (VITAMIN D3) 2000 units TABS  Take 2 tablets by mouth daily.     diclofenac sodium (VOLTAREN) 1 % GEL Apply 2 g topically 4 (four) times daily as needed (painful joints).      divalproex (DEPAKOTE) 500 MG 24 hr tablet Take 1,500 mg by mouth at bedtime.     finasteride (PROSCAR) 5 MG tablet Take 5 mg by mouth daily.     folic acid (FOLVITE) 1 MG tablet Take 1 mg by mouth daily.     furosemide (LASIX) 40 MG tablet Take 1 tablet (40 mg total) by mouth daily. 90 tablet 3   gabapentin (NEURONTIN) 300 MG capsule Take 300-600 mg by mouth at bedtime.      hydroxypropyl methylcellulose (ISOPTO TEARS) 2.5 % ophthalmic solution Place 1 drop into both eyes 4 (four) times daily as needed for dry eyes.     Melatonin 10 MG TABS  Take 1 tablet by mouth at bedtime.      Misc Natural Products (OSTEO BI-FLEX JOINT SHIELD PO) Take 2 tablets by mouth daily.     Multiple Vitamin (MULTIVITAMIN) tablet Take 1 tablet by mouth daily.     nitroGLYCERIN (NITROSTAT) 0.4 MG SL tablet Place 0.4 mg under the tongue every 5 (five) minutes as needed.     Omega-3 Fatty Acids (FISH OIL) 1000 MG CAPS Take 1 capsule by mouth daily.     omeprazole (PRILOSEC) 20 MG capsule Take 20 mg by mouth daily.     potassium chloride SA (K-DUR,KLOR-CON) 20 MEQ tablet Take 1 tablet (20 mEq total) by mouth daily as needed (In the days when you take Lasix).     sacubitril-valsartan (ENTRESTO) 24-26 MG Take 1 tablet by mouth 2 (two) times daily. 28 tablet 0   Tiotropium Bromide-Olodaterol (STIOLTO RESPIMAT) 2.5-2.5 MCG/ACT AERS Inhale 2 puffs into the lungs daily.     Tiotropium Bromide-Olodaterol (STIOLTO RESPIMAT) 2.5-2.5 MCG/ACT AERS Inhale 2 puffs into the lungs daily. 4 g 0   No current facility-administered medications for this visit.   Allergies:  Codeine, Morphine sulfate, and Mupirocin   ROS: No palpitations or syncope.  No device discharges.  Physical Exam: VS:  BP (!) 142/80    Pulse 67    Ht 5\' 7"  (1.702 m)    Wt 137 lb 3.2 oz (62.2 kg)    SpO2 96%    BMI 21.49 kg/m , BMI Body mass index is 21.49 kg/m.  Wt Readings from Last 3 Encounters:  04/03/21 137 lb 3.2 oz (62.2 kg)  03/18/21 138 lb 0.6 oz (62.6 kg)  02/08/21 146 lb (66.2 kg)    General: Elderly male, appears comfortable at rest. HEENT: Conjunctiva and lids normal, wearing a mask. Neck: Supple, no elevated JVP or carotid bruits, no thyromegaly. Lungs: Decreased breath sounds without wheezing, nonlabored breathing at rest. Cardiac: Regular rate and rhythm, no S3 or significant systolic murmur, no pericardial rub. Extremities: No pitting edema.  ECG:  An ECG dated 07/09/2021 was personally reviewed today and demonstrated:  Ventricular paced rhythm with atrial tracking.  Recent  Labwork: 03/18/2021: BNP 55.2; BUN 9; Creatinine, Ser 0.93; Hemoglobin 15.3; Platelets 244; Potassium 4.5; Sodium 138   Other Studies Reviewed Today:  Echocardiogram 06/15/2019:  1. Left ventricular ejection fraction, by estimation, is 55 to 60%. The  left ventricle has normal function. The left ventricle has no regional  wall motion abnormalities. Left ventricular diastolic parameters are  indeterminate.   2. Right ventricular systolic function is normal. The right ventricular  size is normal. There is normal pulmonary  artery systolic pressure.   3. The mitral valve is grossly normal. Mild mitral valve regurgitation.   4. The aortic valve is tricuspid. Aortic valve regurgitation is trivial.  Mild to moderate aortic valve sclerosis/calcification is present, without  any evidence of aortic stenosis.   5. The inferior vena cava is normal in size with greater than 50%  respiratory variability, suggesting right atrial pressure of 3 mmHg.   Chest CT 03/06/2021: IMPRESSION: 1. Right lower lobe pulmonary nodule measuring 2.0 x 1.5 x 1.0 cm, not significantly changed from the prior exam and may represent patient's known non-small cell lung cancer. 2. Stable subpleural left upper lobe pulmonary nodule measuring 7 mm. No new pulmonary nodule is identified. 3. Emphysema. 4. Aortic atherosclerosis and coronary artery calcifications.  Assessment and Plan:  1.  HFrecEF, history of nonischemic cardiomyopathy with normalization of LVEF, 55 to 60% by last assessment in 2021 on medical therapy.  He is clinically stable at this point, NYHA class II-III dyspnea also in the setting of COPD.  Weight is stable.  Repeat echocardiogram for reassessment of LVEF.  Otherwise continue Coreg, Entresto, and Lasix with potassium supplement.  2.  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  He continues to follow with Dr. Rayann Heman.  No device shocks or syncope.  3.  CAD predominantly involving distal circumflex and obtuse  marginal system with collaterals and plan for medical therapy.  He reports no progressive angina symptoms.  Continue statin therapy and as needed nitroglycerin.  Medication Adjustments/Labs and Tests Ordered: Current medicines are reviewed at length with the patient today.  Concerns regarding medicines are outlined above.   Tests Ordered: Orders Placed This Encounter  Procedures   ECHOCARDIOGRAM COMPLETE    Medication Changes: No orders of the defined types were placed in this encounter.   Disposition:  Follow up  1 month.  Signed, Satira Sark, MD, [facility] 04/03/2021 2:42 PM    [facility] [place] [address], [place], [place] [address] Phone: [phone]; Fax: [phone]

## 2021-04-15 ENCOUNTER — Ambulatory Visit (INDEPENDENT_AMBULATORY_CARE_PROVIDER_SITE_OTHER): Payer: No Typology Code available for payment source

## 2021-04-15 DIAGNOSIS — Z95810 Presence of automatic (implantable) cardiac defibrillator: Secondary | ICD-10-CM

## 2021-04-15 DIAGNOSIS — I5022 Chronic systolic (congestive) heart failure: Secondary | ICD-10-CM

## 2021-04-15 NOTE — Progress Notes (Unsigned)
EPIC Encounter for ICM Monitoring  Patient Name: Paul [name]. is a 81 y.o. male Date: 04/15/2021 Primary Care Physican: [place] Primary Cardiologist: Domenic Polite Electrophysiologist: Allred Bi-V Pacing: 99%         04/03/2021 Office Weight: 137 lbs  04/15/2021 Weight: 137 lbs                                                            Spoke with patient and heart failure questions reviewed.  Pt has chronic swelling in left ankle.   He denies any fluid symptoms.  He follows low salt high protein diet and limits fluid intake 64 oz daily.  Pt unsure what may be causing possible fluid accumulation.     Optivol thoracic impedance suggesting possible fluid accumulation starting 03/31/2021.   Impedance suggests history of long episodes of possible fluid accumulation since October 2022 followed by a few days at baseline.     Prescribed:  Furosemide 40 mg take 1 tablet (40 mg total) by mouth daily. Potassium 20 mEq Take 1 tablet (20 mEq total) by mouth daily as needed (In the days when you take Lasix).   Labs: 03/18/2021 Creatinine 0.93, BUN 9, Potassium 4.5, Sodium 138, GFR 83 A complete set of results can be found in Results Review.   Recommendations:  Copy sent to Dr Domenic Polite for review and recommendations if needed.  Impedance suggesting fluid since Oct but patient does not have any correlating symptoms.     Follow-up plan: ICM clinic phone appointment on 04/22/2021 to recheck fluid levels.   91 day device clinic remote transmission 04/19/2021.     EP/Cardiology Office Visits:  Recall 02/07/2022 with Dr Rayann Heman.   Recall 04/03/2022 with Dr. Domenic Polite.     Copy of ICM check sent to Dr. Rayann Heman.  3 month ICM trend: 04/15/2021.    12-14 Month ICM trend:     [name], RN 04/15/2021 11:27 AM

## 2021-04-19 ENCOUNTER — Ambulatory Visit (INDEPENDENT_AMBULATORY_CARE_PROVIDER_SITE_OTHER): Payer: Medicare Other

## 2021-04-19 DIAGNOSIS — I5022 Chronic systolic (congestive) heart failure: Secondary | ICD-10-CM | POA: Diagnosis not present

## 2021-04-19 DIAGNOSIS — Z8679 Personal history of other diseases of the circulatory system: Secondary | ICD-10-CM

## 2021-04-19 LAB — CUP PACEART REMOTE DEVICE CHECK
Battery Remaining Longevity: 58 mo
Battery Voltage: 2.98 V
Brady Statistic AP VP Percent: 3.23 %
Brady Statistic AP VS Percent: 0.02 %
Brady Statistic AS VP Percent: 96.63 %
Brady Statistic AS VS Percent: 0.12 %
Brady Statistic RA Percent Paced: 3.21 %
Brady Statistic RV Percent Paced: 98.93 %
Date Time Interrogation Session: 20230317033522
HighPow Impedance: 47 Ohm
HighPow Impedance: 61 Ohm
Implantable Lead Implant Date: 20081014
Implantable Lead Implant Date: 20081014
Implantable Lead Implant Date: 20081014
Implantable Lead Location: 753858
Implantable Lead Location: 753859
Implantable Lead Location: 753860
Implantable Lead Model: 4542
Implantable Lead Model: 5076
Implantable Lead Model: 6947
Implantable Lead Serial Number: 128499
Implantable Pulse Generator Implant Date: 20191025
Lead Channel Impedance Value: 361 Ohm
Lead Channel Impedance Value: 399 Ohm
Lead Channel Impedance Value: 456 Ohm
Lead Channel Impedance Value: 475 Ohm
Lead Channel Impedance Value: 475 Ohm
Lead Channel Impedance Value: 779 Ohm
Lead Channel Pacing Threshold Amplitude: 0.5 V
Lead Channel Pacing Threshold Amplitude: 0.625 V
Lead Channel Pacing Threshold Amplitude: 1.25 V
Lead Channel Pacing Threshold Pulse Width: 0.4 ms
Lead Channel Pacing Threshold Pulse Width: 0.4 ms
Lead Channel Pacing Threshold Pulse Width: 0.4 ms
Lead Channel Sensing Intrinsic Amplitude: 1.375 mV
Lead Channel Sensing Intrinsic Amplitude: 1.375 mV
Lead Channel Sensing Intrinsic Amplitude: 22.25 mV
Lead Channel Sensing Intrinsic Amplitude: 22.25 mV
Lead Channel Setting Pacing Amplitude: 1.25 V
Lead Channel Setting Pacing Amplitude: 1.5 V
Lead Channel Setting Pacing Amplitude: 2.5 V
Lead Channel Setting Pacing Pulse Width: 0.4 ms
Lead Channel Setting Pacing Pulse Width: 0.4 ms
Lead Channel Setting Sensing Sensitivity: 0.3 mV

## 2021-04-22 ENCOUNTER — Ambulatory Visit (INDEPENDENT_AMBULATORY_CARE_PROVIDER_SITE_OTHER): Payer: Medicare Other

## 2021-04-22 DIAGNOSIS — I5022 Chronic systolic (congestive) heart failure: Secondary | ICD-10-CM

## 2021-04-22 DIAGNOSIS — Z95810 Presence of automatic (implantable) cardiac defibrillator: Secondary | ICD-10-CM

## 2021-04-22 NOTE — Progress Notes (Signed)
EPIC Encounter for ICM Monitoring ? ?Patient Name: Paul [name]. is a 81 y.o. male ?Date: 04/22/2021 ?Primary Care Physican: [place] ?Primary Cardiologist: Domenic Polite ?Electrophysiologist: Allred ?Bi-V Pacing: 99%         ?04/03/2021 Office Weight: 137 lbs  ?04/15/2021 Weight: 137 lbs ?                                                         ?  ?Spoke with patient and heart failure questions reviewed.  Pt asymptomatic for fluid accumulation.   ?  ?Optivol thoracic impedance suggesting possible fluid accumulation starting 03/31/2021 but no correlating symptoms.   Impedance suggests history of long episodes of possible fluid accumulation since October 2022 followed by a few days at baseline.   ?  ?Prescribed:  ?Furosemide 40 mg take 1 tablet (40 mg total) by mouth daily. ?Potassium 20 mEq Take 1 tablet (20 mEq total) by mouth daily as needed (In the days when you take Lasix). ?  ?Labs: ?03/18/2021 Creatinine 0.93, BUN 9, Potassium 4.5, Sodium 138, GFR 83 ?A complete set of results can be found in Results Review. ?  ?Recommendations:  Recommendation to limit salt intake to 2000 mg daily and fluid intake to 64 oz daily.  Encouraged to call if experiencing any fluid symptoms.  ?  ?Follow-up plan: ICM clinic phone appointment on 05/06/2021.   91 day device clinic remote transmission 07/19/2021.   ?  ?EP/Cardiology Office Visits:  Recall 02/07/2022 with Dr Rayann Heman.   Recall 04/03/2022 with Dr. Domenic Polite.   ?  ?Copy of ICM check sent to Dr. Rayann Heman. ?  ?3 month ICM trend: 04/22/2021. ? ? ? ?12-14 Month ICM trend:  ? ? ? ?[name], RN ?04/22/2021 ?11:51 AM ? ?

## 2021-04-26 NOTE — Progress Notes (Signed)
Remote ICD transmission.   

## 2021-05-06 ENCOUNTER — Ambulatory Visit (INDEPENDENT_AMBULATORY_CARE_PROVIDER_SITE_OTHER): Payer: Medicare Other

## 2021-05-06 DIAGNOSIS — I5022 Chronic systolic (congestive) heart failure: Secondary | ICD-10-CM | POA: Diagnosis not present

## 2021-05-06 DIAGNOSIS — Z95810 Presence of automatic (implantable) cardiac defibrillator: Secondary | ICD-10-CM | POA: Diagnosis not present

## 2021-05-06 NOTE — Progress Notes (Signed)
EPIC Encounter for ICM Monitoring ? ?Patient Name: Paul [name]. is a 81 y.o. male ?Date: 05/06/2021 ?Primary Care Physican: [place] ?Primary Cardiologist: Domenic Polite ?Electrophysiologist: Allred ?Bi-V Pacing: 99%         ?04/03/2021 Office Weight: 137 lbs  ?04/15/2021 Weight: 137 lbs ?                                                         ?  ?Attempted call to patient and unable to reach.  Left detailed message per DPR regarding transmission. Transmission reviewed.  ?  ?Optivol thoracic impedance suggesting fluid levels returned to normal.   Impedance suggests history of long episodes of possible fluid accumulation since October 2022 followed by a few days at baseline.   ?  ?Prescribed:  ?Furosemide 40 mg take 1 tablet (40 mg total) by mouth daily. ?Potassium 20 mEq Take 1 tablet (20 mEq total) by mouth daily as needed (In the days when you take Lasix). ?  ?Labs: ?03/18/2021 Creatinine 0.93, BUN 9, Potassium 4.5, Sodium 138, GFR 83 ?A complete set of results can be found in Results Review. ?  ?Recommendations:  Left voice mail with ICM number and encouraged to call if experiencing any fluid symptoms. ?  ?Follow-up plan: ICM clinic phone appointment on 06/10/2021.   91 day device clinic remote transmission 07/19/2021.   ?  ?EP/Cardiology Office Visits:  Recall 02/07/2022 with Dr Rayann Heman.   Recall 04/03/2022 with Dr. Domenic Polite.   ?  ?Copy of ICM check sent to Dr. Rayann Heman. ? ?3 month ICM trend: 05/06/2021. ? ? ? ?12-14 Month ICM trend:  ? ? ? ?[name], RN ?05/06/2021 ?4:58 PM ? ?

## 2021-05-07 ENCOUNTER — Telehealth: Payer: Self-pay

## 2021-05-07 NOTE — Telephone Encounter (Signed)
Remote ICM transmission received.  Attempted call to patient regarding ICM remote transmission and left detailed message per DPR.  Advised to return call for any fluid symptoms or questions.  

## 2021-05-23 ENCOUNTER — Ambulatory Visit (INDEPENDENT_AMBULATORY_CARE_PROVIDER_SITE_OTHER): Payer: No Typology Code available for payment source

## 2021-05-23 DIAGNOSIS — I447 Left bundle-branch block, unspecified: Secondary | ICD-10-CM

## 2021-05-23 LAB — ECHOCARDIOGRAM COMPLETE
AR max vel: 2.61 cm2
AV Area VTI: 2.2 cm2
AV Area mean vel: 2.36 cm2
AV Mean grad: 6 mmHg
AV Peak grad: 10.1 mmHg
AV Vena cont: 0.36 cm
Ao pk vel: 1.59 m/s
Area-P 1/2: 2.42 cm2
Calc EF: 59.5 %
P 1/2 time: 747 msec
S' Lateral: 3.33 cm
Single Plane A2C EF: 57.8 %
Single Plane A4C EF: 60.6 %

## 2021-06-10 ENCOUNTER — Ambulatory Visit (INDEPENDENT_AMBULATORY_CARE_PROVIDER_SITE_OTHER): Payer: Medicare Other

## 2021-06-10 DIAGNOSIS — I5022 Chronic systolic (congestive) heart failure: Secondary | ICD-10-CM | POA: Diagnosis not present

## 2021-06-10 DIAGNOSIS — Z95810 Presence of automatic (implantable) cardiac defibrillator: Secondary | ICD-10-CM | POA: Diagnosis not present

## 2021-06-10 NOTE — Progress Notes (Signed)
EPIC Encounter for ICM Monitoring ? ?Patient Name: Paul [name]. is a 81 y.o. male ?Date: 06/10/2021 ?Primary Care Physican: [place] ?Primary Cardiologist: Domenic Polite ?Electrophysiologist: Allred ?Bi-V Pacing: 99.3%         ?04/03/2021 Office Weight: 137 lbs  ?04/15/2021 Weight: 137 lbs ?                                                         ?  ?Attempted call to patient and unable to reach.  Left detailed message per DPR regarding transmission. Transmission reviewed.  ?  ?Optivol thoracic impedance suggesting possible fluid accumulation starting 4/24.   ?  ?Prescribed:  ?Furosemide 40 mg take 1 tablet (40 mg total) by mouth daily. ?Potassium 20 mEq Take 1 tablet (20 mEq total) by mouth daily as needed (In the days when you take Lasix). ?  ?Labs: ?03/18/2021 Creatinine 0.93, BUN 9, Potassium 4.5, Sodium 138, GFR 83 ?A complete set of results can be found in Results Review. ?  ?Recommendations:  Left voice mail with ICM number and encouraged to call if experiencing any fluid symptoms. ?  ?Follow-up plan: ICM clinic phone appointment on 06/17/2021 (manual) to recheck fluid levels.   91 day device clinic remote transmission 07/19/2021.   ?  ?EP/Cardiology Office Visits:  Recall 02/07/2022 with Dr Rayann Heman.   Recall 04/03/2022 with Dr. Domenic Polite.   ?  ?Copy of ICM check sent to Dr. Rayann Heman.  Will send to Dr Domenic Polite for review if patient is reached.  ?  ?3 month ICM trend: 06/10/2021. ? ? ? ?12-14 Month ICM trend:  ? ? ? ?[name], RN ?06/10/2021 ?12:55 PM ? ?

## 2021-06-11 ENCOUNTER — Telehealth: Payer: Self-pay

## 2021-06-11 NOTE — Telephone Encounter (Signed)
Remote ICM transmission received.  Attempted call to patient regarding ICM remote transmission and left detailed message per DPR.  Advised to return call for any fluid symptoms or questions. Next ICM remote transmission scheduled 06/17/2021.   ? ?

## 2021-06-17 ENCOUNTER — Ambulatory Visit (INDEPENDENT_AMBULATORY_CARE_PROVIDER_SITE_OTHER): Payer: No Typology Code available for payment source | Admitting: Internal Medicine

## 2021-06-17 ENCOUNTER — Encounter: Payer: Self-pay | Admitting: Internal Medicine

## 2021-06-17 DIAGNOSIS — J9611 Chronic respiratory failure with hypoxia: Secondary | ICD-10-CM

## 2021-06-17 DIAGNOSIS — J449 Chronic obstructive pulmonary disease, unspecified: Secondary | ICD-10-CM | POA: Diagnosis not present

## 2021-06-17 MED ORDER — STIOLTO RESPIMAT 2.5-2.5 MCG/ACT IN AERS: 2.0000 | INHALATION_SPRAY | Freq: Every day | RESPIRATORY_TRACT | 0 refills | Status: DC

## 2021-06-17 NOTE — Patient Instructions (Addendum)
Ask for a copy of your PFTs from the [place] ? ?Plan A = Automatic = Always=    Stiolto 2 puffs each am  ? ?Plan B = Backup (to supplement plan A, not to replace it) ?Only use your albuterol inhaler as a rescue medication to be used if you can't catch your breath by resting or doing a relaxed purse lip breathing pattern.  ?- The less you use it, the better it will work when you need it. ?- Ok to use the inhaler up to 2 puffs  every 4 hours if you must but call for appointment if use goes up over your usual need ?- Don't leave home without it !!  (think of it like starter for your car)  ? ?Ok to try albuterol 15 min before an activity (on alternating days)  that you know would usually make you short of breath and see if it makes any difference and if makes none then don't take albuterol after activity unless you can't catch your breath as this means it's the resting that helps, not the albuterol. ?    ? ?Make sure you check your oxygen saturation  AT  your highest level of activity (not after you stop)   to be sure it stays over 90% and adjust  02 flow upward to maintain this level if needed but remember to turn it back to previous settings when you stop (to conserve your supply).  ? ?Please schedule a follow up visit in 6 months but call sooner if needed  ?

## 2021-06-17 NOTE — Progress Notes (Signed)
? ?Paul [name]., male    DOB: February 17, 1940,    MRN: [id number] ? ? ?Brief patient profile:  ?10    yowm  quit smoking 11/2019  referred to pulmonary clinic in [facility]  03/18/2021 by Herold Harms / PCP at [facility]  for copd evaluation   ? ?Finished RT 2020 in [place]  ? ? ?History of Present Illness  ?03/18/2021  Pulmonary/ 1st office eval/ Raheen Capili / [place] [facility] maint on stiolto but hfa poor  ?Chief Complaint  ?Patient presents with  ? Consult  ?  Hx of COPD and lung cancer. Would like to discuss In[place], patient is not currently on oxygen   ?Dyspnea:  onset was around 2018 gradually worse until quit smoking then some better  ?Quit walking to [employer] several years pta 300 ft and has to stop while walking back to house  ?Cough: none ?Sleep: bed is flat, on side, one pillow  ?SABA use: way too much ?Rec ?Plan A = Automatic = Always=    stiolto 2 puffs each am  ?Work on inhaler technique:   ? Plan B = Backup (to supplement plan A, not to replace it) ?Only use your albuterol inhaler as a rescue medication ?Ok to try albuterol 15 min before an activity (on alternating days)  that you know would usually make you short of breath  ?PFTs next available > not done as of 06/17/2021  "[facility]"  ? ?  ? ? ?06/17/2021  f/u ov/[place] office/Nanea Jared re: copd ? / group B  maint on stiolto   ?Chief Complaint  ?Patient presents with  ? Follow-up  ?  Patients breathing has improved since last ov. Has gotten portable O2 and a concentrator. Has not used yet  ?  ?Dyspnea:  MMRC2 = can't walk a nl pace on a flat grade s sob but does fine slow and flat eg shopping  - taking trash out sometimes  ?Cough: none  ?Sleeping: bed is flat / no resp cc  ?SABA use: using bid  "I'm supposed to be taking in every 4 hours"  (note this is not the rec made at last  ?02: started by [place] but not using  ?  ?[facility] ? ? ?No obvious day to day or daytime variability or assoc excess/ purulent sputum or mucus plugs or hemoptysis or cp  or chest tightness, subjective wheeze or overt sinus or hb symptoms.  ? ?Sleeping  without nocturnal  or early am exacerbation  of respiratory  c/o's or need for noct saba. Also denies any obvious fluctuation of symptoms with weather or environmental changes or other aggravating or alleviating factors except as outlined above  ? ?No unusual exposure hx or h/o childhood pna/ asthma or knowledge of premature birth. ? ?Current Allergies, Complete Past Medical History, Past Surgical History, Family History, and Social History were reviewed in [employer] record. ? ?ROS  The following are not active complaints unless bolded ?Hoarseness, sore throat, dysphagia, dental problems, itching, sneezing,  nasal congestion or discharge of excess mucus or purulent secretions, ear ache,   fever, chills, sweats, unintended wt loss or wt gain, classically pleuritic or exertional cp,  orthopnea pnd or arm/hand swelling  or leg swelling, presyncope, palpitations, abdominal pain, anorexia, nausea, vomiting, diarrhea  or change in bowel habits or change in bladder habits, change in stools or change in urine, dysuria, hematuria,  rash, arthralgias/ L leg (medial thigh) pain, visual complaints, headache,  numbness, weakness or ataxia or problems with walking or coordination,  change in mood or  memory. ?      ? ?Current Meds  ?Medication Sig  ? acetaminophen (TYLENOL) 500 MG tablet Take 1,000 mg by mouth daily.  ? albuterol (PROVENTIL HFA;VENTOLIN HFA) 108 (90 Base) MCG/ACT inhaler Inhale 2 puffs into the lungs every 4 (four) hours as needed for wheezing or shortness of breath.  ? atorvastatin (LIPITOR) 80 MG tablet Take 80 mg by mouth daily.  ? busPIRone (BUSPAR) 10 MG tablet Take 5 mg by mouth 2 (two) times daily.   ? carbamide peroxide (DEBROX) 6.5 % otic solution Place 5 drops into both ears daily as needed (for ear vwax removal).  ? carvedilol (COREG) 25 MG tablet Take 12.5 mg by mouth 2 (two) times daily.  ?  cetirizine (ZYRTEC) 10 MG tablet Take 10 mg by mouth daily.  ? Cholecalciferol (VITAMIN D3) 2000 units TABS Take 2 tablets by mouth daily.  ? diclofenac sodium (VOLTAREN) 1 % GEL Apply 2 g topically 4 (four) times daily as needed (painful joints).   ? divalproex (DEPAKOTE) 500 MG 24 hr tablet Take 1,500 mg by mouth at bedtime.  ? finasteride (PROSCAR) 5 MG tablet Take 5 mg by mouth daily.  ? folic acid (FOLVITE) 1 MG tablet Take 1 mg by mouth daily.  ? furosemide (LASIX) 40 MG tablet Take 1 tablet (40 mg total) by mouth daily.  ? gabapentin (NEURONTIN) 300 MG capsule Take 300-600 mg by mouth at bedtime.   ? hydroxypropyl methylcellulose (ISOPTO TEARS) 2.5 % ophthalmic solution Place 1 drop into both eyes 4 (four) times daily as needed for dry eyes.  ? Melatonin 10 MG TABS Take 1 tablet by mouth at bedtime.   ? Misc Natural Products (OSTEO BI-FLEX JOINT SHIELD PO) Take 2 tablets by mouth daily.  ? Multiple Vitamin (MULTIVITAMIN) tablet Take 1 tablet by mouth daily.  ? nitroGLYCERIN (NITROSTAT) 0.4 MG SL tablet Place 0.4 mg under the tongue every 5 (five) minutes as needed.  ? Omega-3 Fatty Acids (FISH OIL) 1000 MG CAPS Take 1 capsule by mouth daily.  ? omeprazole (PRILOSEC) 20 MG capsule Take 20 mg by mouth daily.  ? potassium chloride SA (K-DUR,KLOR-CON) 20 MEQ tablet Take 1 tablet (20 mEq total) by mouth daily as needed (In the days when you take Lasix).  ? Tiotropium Bromide-Olodaterol (STIOLTO RESPIMAT) 2.5-2.5 MCG/ACT AERS Inhale 2 puffs into the lungs daily.  ?     ? ?  ?      ?   ? ?Past Medical History:  ?Diagnosis Date  ? BPH (benign prostatic hyperplasia)   ? CAD (coronary artery disease)   ? Distal circumflex and OM disease with collaterals 2008  ? Cardiomyopathy   ? LVEF 25% up to 65%  ? COPD (chronic obstructive pulmonary disease) ([place])   ? Dyslipidemia   ? Essential hypertension   ? Gout   ? History of alcohol abuse   ? ICD (implantable cardiac defibrillator) battery depletion   ? CRT-D device  ? LBBB  (left bundle branch block)   ? Lung cancer ([place])   ? Squamous cell carcinoma RLL lung  ? Lung nodule   ? [facility]  ? Migraines   ? Mitral regurgitation   ? Severe before CRT with annular dilatation  ? Orthostatic syncope   ? ?  ? ? ?Objective:  ?  ? ?Wt Readings from Last 3 Encounters:  ?06/17/21 138 lb 9.6  oz (62.9 kg)  ?04/03/21 137 lb 3.2 oz (62.2 kg)  ?03/18/21 138 lb 0.6 oz (62.6 kg)  ?  ? ? ?Vital signs reviewed  06/17/2021  - Note at rest 02 sats  96% on RA  ? ?General appearance:    elderly wm rambling conversation / shuffling slow gait ? ?HEENT :  Oropharynx  edentulous  Nasal turbintes nl  ? ? ?NECK :  without JVD/Nodes/TM/ nl carotid upstrokes bilaterally ? ? ?LUNGS: no acc muscle use,  Mod barrel  contour chest wall with bilateral  Distant bs s audible wheeze and  without cough on insp or exp maneuvers and mod  Hyperresonant  to  percussion bilaterally   ? ? ?CV:  RRR  no s3 or murmur or increase in P2, and no edema  ? ?ABD:  soft and nontender with pos mid insp Hoover's  in the supine position. No bruits or organomegaly appreciated, bowel sounds nl ? ?MS:   Ext warm without deformities or   obvious joint restrictions , calf tenderness, cyanosis or clubbing ? ?SKIN: warm and dry without lesions   ? ?NEURO:  alert, approp, nl sensorium with  no motor or cerebellar deficits apparent.  ?   ?    ? ? ?I personally reviewed images and agree with radiology impression as follows:  ? Chest CT w/o contrast 03/06/21 ?1. Right lower lobe pulmonary nodule measuring 2.0 x 1.5 x 1.0 cm, ?not significantly changed from the prior exam and may represent ?patient's known non-small cell lung cancer. ?2. Stable subpleural left upper lobe pulmonary nodule measuring 7 ?mm. No new pulmonary nodule is identified. ?3. Emphysema. ?4. Aortic atherosclerosis and coronary artery calcifications. ?   ?Assessment  ?  ?  ?  ?       ? ?   ?   ?

## 2021-06-17 NOTE — Addendum Note (Signed)
Addended by: Fritzi Mandes D on: 06/17/2021 11:07 AM ? ? Modules accepted: Orders ? ?

## 2021-06-17 NOTE — Assessment & Plan Note (Addendum)
Quit smoking 2021 p onset of symptoms of doe around 2018  ?- 03/18/2021  After extensive coaching inhaler device,  effectiveness =    75%  (very short breath hold) > continue stiolto 2 puffs each am  ?- Labs ordered 03/18/2021  :  allergy screen Eos 0.1/ IgE 11   alpha one AT phenotype  MM  Level 182 ?- 03/18/2021   patient walked at a slow pace on room air. SOB p 300 ft but completed 450 ft with lowest sats 94%  ?- 06/17/2021  After extensive coaching inhaler device,  effectiveness =   90% with smi > continue stiolto ? ?Pt is Group B in terms of symptom/risk and laba/lama therefore appropriate rx at this point >>>  stiolto 2 puffs each am and more approp use saba ? ?Ok to try albuterol 15 min before an activity (on alternating days)  that you know would usually make you short of breath and see if it makes any difference and if makes none then don't take albuterol after activity unless you can't catch your breath as this means it's the resting that helps, not the albuterol. ? ? ?>>> f/u q 6 m ?     ? ?  ?

## 2021-06-17 NOTE — Assessment & Plan Note (Addendum)
Started by 02 at [facility] April 2023  ?-  06/17/2021   Walked on RA  x  RA  lap(s) =  approx 450  ft  @ slow shulling pace, stopped due to end of study with lowest 02 sats 91% and denied sob ?>>>  rec 2lpm hs and prn daytime with goal of > 90%   ? ? ?Each maintenance medication was reviewed in detail including emphasizing most importantly the difference between maintenance and prns and under what circumstances the prns are to be triggered using an action plan format where appropriate. ? ?Total time for H and P, chart review, counseling, reviewing hfa/smi/02 device(s) , directly observing portions of ambulatory 02 saturation study/ and generating customized AVS unique to this office visit / same day charting > 30 min  ?     ?  ?      ?

## 2021-06-21 NOTE — Progress Notes (Signed)
No ICM remote transmission received for 06/17/2021 and next ICM transmission scheduled for 06/24/2021.

## 2021-06-24 ENCOUNTER — Ambulatory Visit (INDEPENDENT_AMBULATORY_CARE_PROVIDER_SITE_OTHER): Payer: Medicare Other

## 2021-06-24 DIAGNOSIS — Z95810 Presence of automatic (implantable) cardiac defibrillator: Secondary | ICD-10-CM

## 2021-06-24 DIAGNOSIS — I5022 Chronic systolic (congestive) heart failure: Secondary | ICD-10-CM

## 2021-06-25 NOTE — Progress Notes (Signed)
EPIC Encounter for ICM Monitoring  Patient Name: Ottavio Norem. is a 81 y.o. male Date: 06/25/2021 Primary Care Physican: Tiana Loft, [place] Primary Cardiologist: Domenic Polite Electrophysiologist: Allred Bi-V Pacing: 99.6%         04/03/2021 Office Weight: 137 lbs  04/15/2021 Weight: 137 lbs                                                            Spoke with patient and heart failure questions reviewed.  Pt asymptomatic for fluid accumulation.  Reports feeling well at this time and voices no complaints.    Optivol thoracic impedance suggesting fluid levels returned to normal.     Prescribed:  Furosemide 40 mg take 1 tablet (40 mg total) by mouth daily. Potassium 20 mEq Take 1 tablet (20 mEq total) by mouth daily as needed (In the days when you take Lasix).   Labs: 03/18/2021 Creatinine 0.93, BUN 9, Potassium 4.5, Sodium 138, GFR 83 A complete set of results can be found in Results Review.   Recommendations:  No changes and encouraged to call if experiencing any fluid symptoms.   Follow-up plan: ICM clinic phone appointment on 07/15/2021.   91 day device clinic remote transmission 07/19/2021.     EP/Cardiology Office Visits:  Recall 02/07/2022 with Dr Rayann Heman.   Recall 04/03/2022 with Dr. Domenic Polite.     Copy of ICM check sent to Dr. Rayann Heman.    3 month ICM trend: 06/25/2021.    12-14 Month ICM trend:     [name], RN 06/25/2021 1:54 PM

## 2021-07-03 DIAGNOSIS — Z609 Problem related to social environment, unspecified: Secondary | ICD-10-CM | POA: Diagnosis not present

## 2021-07-03 DIAGNOSIS — Z87891 Personal history of nicotine dependence: Secondary | ICD-10-CM | POA: Diagnosis not present

## 2021-07-03 DIAGNOSIS — S81852A Open bite, left lower leg, initial encounter: Secondary | ICD-10-CM | POA: Diagnosis not present

## 2021-07-03 DIAGNOSIS — Z885 Allergy status to narcotic agent status: Secondary | ICD-10-CM | POA: Diagnosis not present

## 2021-07-03 DIAGNOSIS — W57XXXA Bitten or stung by nonvenomous insect and other nonvenomous arthropods, initial encounter: Secondary | ICD-10-CM | POA: Diagnosis not present

## 2021-07-03 DIAGNOSIS — J449 Chronic obstructive pulmonary disease, unspecified: Secondary | ICD-10-CM | POA: Diagnosis not present

## 2021-07-03 DIAGNOSIS — R21 Rash and other nonspecific skin eruption: Secondary | ICD-10-CM | POA: Diagnosis not present

## 2021-07-15 ENCOUNTER — Ambulatory Visit (INDEPENDENT_AMBULATORY_CARE_PROVIDER_SITE_OTHER): Payer: No Typology Code available for payment source

## 2021-07-15 DIAGNOSIS — Z95810 Presence of automatic (implantable) cardiac defibrillator: Secondary | ICD-10-CM | POA: Diagnosis not present

## 2021-07-15 DIAGNOSIS — I5022 Chronic systolic (congestive) heart failure: Secondary | ICD-10-CM | POA: Diagnosis not present

## 2021-07-17 ENCOUNTER — Telehealth: Payer: Self-pay

## 2021-07-17 NOTE — Telephone Encounter (Signed)
Remote ICM transmission received.  Attempted call to patient regarding ICM remote transmission and left detailed message per DPR.  Advised to return call for any fluid symptoms or questions. Next ICM remote transmission scheduled 08/19/2021.

## 2021-07-17 NOTE — Progress Notes (Signed)
EPIC Encounter for ICM Monitoring  Patient Name: Paul [name]. is a 81 y.o. male Date: 07/17/2021 Primary Care Physican: Tiana Loft, [place] Primary Cardiologist: Domenic Polite Electrophysiologist: Allred Bi-V Pacing: 99.2%         04/03/2021 Office Weight: 137 lbs  04/15/2021 Weight: 137 lbs                                                            Attempted call to patient and unable to reach.  Left detailed message per DPR regarding transmission. Transmission reviewed.    Optivol thoracic impedance suggesting normal fluid levels.     Prescribed:  Furosemide 40 mg take 1 tablet (40 mg total) by mouth daily. Potassium 20 mEq Take 1 tablet (20 mEq total) by mouth daily as needed (In the days when you take Lasix).   Labs: 03/18/2021 Creatinine 0.93, BUN 9, Potassium 4.5, Sodium 138, GFR 83 A complete set of results can be found in Results Review.   Recommendations:  Left voice mail with ICM number and encouraged to call if experiencing any fluid symptoms.   Follow-up plan: ICM clinic phone appointment on 08/19/2021.   91 day device clinic remote transmission 10/18/2021.     EP/Cardiology Office Visits:  Recall 02/07/2022 with Dr Rayann Heman.   Recall 04/03/2022 with Dr. Domenic Polite.     Copy of ICM check sent to Dr. Rayann Heman.     3 month ICM trend: 07/15/2021.    12-14 Month ICM trend:     [name], RN 07/17/2021 2:27 PM

## 2021-07-19 ENCOUNTER — Ambulatory Visit (INDEPENDENT_AMBULATORY_CARE_PROVIDER_SITE_OTHER): Payer: No Typology Code available for payment source

## 2021-07-19 DIAGNOSIS — Z8679 Personal history of other diseases of the circulatory system: Secondary | ICD-10-CM | POA: Diagnosis not present

## 2021-07-20 LAB — CUP PACEART REMOTE DEVICE CHECK
Battery Remaining Longevity: 54 mo
Battery Voltage: 2.97 V
Brady Statistic AP VP Percent: 4.49 %
Brady Statistic AP VS Percent: 0.01 %
Brady Statistic AS VP Percent: 94.29 %
Brady Statistic AS VS Percent: 1.21 %
Brady Statistic RA Percent Paced: 4.45 %
Brady Statistic RV Percent Paced: 96.82 %
Date Time Interrogation Session: 20230616033323
HighPow Impedance: 52 Ohm
HighPow Impedance: 67 Ohm
Implantable Lead Implant Date: 20081014
Implantable Lead Implant Date: 20081014
Implantable Lead Implant Date: 20081014
Implantable Lead Location: 753858
Implantable Lead Location: 753859
Implantable Lead Location: 753860
Implantable Lead Model: 4542
Implantable Lead Model: 5076
Implantable Lead Model: 6947
Implantable Lead Serial Number: 128499
Implantable Pulse Generator Implant Date: 20191025
Lead Channel Impedance Value: 399 Ohm
Lead Channel Impedance Value: 418 Ohm
Lead Channel Impedance Value: 475 Ohm
Lead Channel Impedance Value: 513 Ohm
Lead Channel Impedance Value: 551 Ohm
Lead Channel Impedance Value: 893 Ohm
Lead Channel Pacing Threshold Amplitude: 0.5 V
Lead Channel Pacing Threshold Amplitude: 0.75 V
Lead Channel Pacing Threshold Amplitude: 1.125 V
Lead Channel Pacing Threshold Pulse Width: 0.4 ms
Lead Channel Pacing Threshold Pulse Width: 0.4 ms
Lead Channel Pacing Threshold Pulse Width: 0.4 ms
Lead Channel Sensing Intrinsic Amplitude: 1.125 mV
Lead Channel Sensing Intrinsic Amplitude: 1.125 mV
Lead Channel Sensing Intrinsic Amplitude: 17.75 mV
Lead Channel Sensing Intrinsic Amplitude: 17.75 mV
Lead Channel Setting Pacing Amplitude: 1.25 V
Lead Channel Setting Pacing Amplitude: 1.5 V
Lead Channel Setting Pacing Amplitude: 2.25 V
Lead Channel Setting Pacing Pulse Width: 0.4 ms
Lead Channel Setting Pacing Pulse Width: 0.4 ms
Lead Channel Setting Sensing Sensitivity: 0.3 mV

## 2021-07-25 NOTE — Progress Notes (Signed)
Remote ICD transmission.   

## 2021-08-19 ENCOUNTER — Ambulatory Visit (INDEPENDENT_AMBULATORY_CARE_PROVIDER_SITE_OTHER): Payer: No Typology Code available for payment source

## 2021-08-19 DIAGNOSIS — I5022 Chronic systolic (congestive) heart failure: Secondary | ICD-10-CM

## 2021-08-19 DIAGNOSIS — Z95810 Presence of automatic (implantable) cardiac defibrillator: Secondary | ICD-10-CM

## 2021-08-20 NOTE — Progress Notes (Signed)
EPIC Encounter for ICM Monitoring  Patient Name: Paul [name]. is a 81 y.o. male Date: 08/20/2021 Primary Care Physican: Tiana Loft, [place] Primary Cardiologist: Domenic Polite Electrophysiologist: Allred Bi-V Pacing: 99.7%         08/20/2021 Weight: 145 lbs                                                            Spoke with patient and heart failure questions reviewed.  Pt asymptomatic for fluid accumulation.  Reports feeling well at this time and voices no complaints.  He has been drinking a lot of fluid due to hot weather   Optivol thoracic impedance suggesting possible fluid accumulation starting 7/10.     Prescribed:  Furosemide 40 mg take 1 tablet (40 mg total) by mouth daily.  7/18 Pt reports he is taking 20 mg daily instead of 40 mg daily because higher dosage keeps him in the bathroom too much.  Potassium 20 mEq Take 1 tablet (20 mEq total) by mouth daily as needed (In the days when you take Lasix).   Labs: 03/18/2021 Creatinine 0.93, BUN 9, Potassium 4.5, Sodium 138, GFR 83 A complete set of results can be found in Results Review.   Recommendations:  Advised to take Furosemide prescribed dosage of 40 mg daily instead of 20 mg daily.  Recommendation to limit salt intake to 2000 mg daily and fluid intake to 64 oz daily.  Encouraged to call if experiencing any fluid symptoms.    Follow-up plan: ICM clinic phone appointment on 09/02/2021 to recheck fluid levels.   91 day device clinic remote transmission 10/18/2021.     EP/Cardiology Office Visits:  Recall 02/07/2022 with Dr Rayann Heman.   Recall 04/03/2022 with Dr. Domenic Polite.     Copy of ICM check sent to Dr. Rayann Heman.     3 month ICM trend: 08/19/2021.    12-14 Month ICM trend:     [name], RN 08/20/2021 8:33 AM

## 2021-09-02 ENCOUNTER — Ambulatory Visit (INDEPENDENT_AMBULATORY_CARE_PROVIDER_SITE_OTHER): Payer: Medicare Other

## 2021-09-02 DIAGNOSIS — Z95810 Presence of automatic (implantable) cardiac defibrillator: Secondary | ICD-10-CM

## 2021-09-02 DIAGNOSIS — I5022 Chronic systolic (congestive) heart failure: Secondary | ICD-10-CM

## 2021-09-05 NOTE — Progress Notes (Signed)
EPIC Encounter for ICM Monitoring  Patient Name: Paul [name]. is a 81 y.o. male Date: 09/05/2021 Primary Care Physican: Tiana Loft, FNP Primary Cardiologist: Domenic Polite Electrophysiologist: Allred Bi-V Pacing: 99.4%         08/20/2021 Weight: 145 lbs                                                            Spoke with patient and heart failure questions reviewed.  Pt asymptomatic for fluid accumulation.  Reports feeling well at this time and voices no complaints.     Optivol thoracic impedance suggesting fluid levels returned to normal.     Prescribed:  Furosemide 40 mg take 1 tablet (40 mg total) by mouth daily.  7/18 Pt reports he is taking 20 mg daily instead of 40 mg daily because higher dosage keeps him in the bathroom too much.  Potassium 20 mEq Take 1 tablet (20 mEq total) by mouth daily as needed (In the days when you take Lasix).   Labs: 03/18/2021 Creatinine 0.93, BUN 9, Potassium 4.5, Sodium 138, GFR 83 A complete set of results can be found in Results Review.   Recommendations:  No changes and encouraged to call if experiencing any fluid symptoms.   Follow-up plan: ICM clinic phone appointment on 09/30/2021.   91 day device clinic remote transmission 10/18/2021.     EP/Cardiology Office Visits:  Recall 02/07/2022 with Dr Rayann Heman.   Recall 04/03/2022 with Dr. Domenic Polite.     Copy of ICM check sent to Dr. Rayann Heman.     3 month ICM trend: 09/02/2021.    12-14 Month ICM trend:     [name], RN 09/05/2021 12:35 PM

## 2021-09-09 ENCOUNTER — Encounter: Payer: Self-pay | Admitting: Urology

## 2021-09-09 NOTE — Progress Notes (Signed)
Telephone appointment. I verified patient's identity and began nursing interview. Patient reports some general arthritis discomfort, but is otherwise well. No other issues reported at this time.  Meaningful use complete.  Reminded patient of his 11:00am-09/11/21 telephone appointment w/ Ashlyn Bruning PA-C. I left my extension [phone] in case patient needs anything. Patient verbalized understanding.  Patient contact [phone]

## 2021-09-11 ENCOUNTER — Telehealth: Payer: Self-pay | Admitting: *Deleted

## 2021-09-11 ENCOUNTER — Ambulatory Visit
Admission: RE | Admit: 2021-09-11 | Discharge: 2021-09-11 | Disposition: A | Discharge status: Home / Self Care | Payer: Medicare Other | Source: Ambulatory Visit | Attending: Urology | Admitting: Urology

## 2021-09-11 NOTE — Telephone Encounter (Signed)
RETURNED PATIENT'S PHONE CALL, SPOKE WITH PATIENT. ?

## 2021-09-23 ENCOUNTER — Telehealth: Payer: Self-pay | Admitting: *Deleted

## 2021-09-23 NOTE — Telephone Encounter (Signed)
Called patient to remind of CT for 09-24-21- arrival time- 1:30 pm @ [facility], patient to receive results from [employer] on 09-27-21 @ 8:30 am via telephone, spoke with patient and he is aware of these appts.

## 2021-09-24 DIAGNOSIS — C3431 Malignant neoplasm of lower lobe, right bronchus or lung: Secondary | ICD-10-CM | POA: Diagnosis not present

## 2021-09-24 DIAGNOSIS — J439 Emphysema, unspecified: Secondary | ICD-10-CM | POA: Diagnosis not present

## 2021-09-24 DIAGNOSIS — R911 Solitary pulmonary nodule: Secondary | ICD-10-CM | POA: Diagnosis not present

## 2021-09-24 DIAGNOSIS — C3490 Malignant neoplasm of unspecified part of unspecified bronchus or lung: Secondary | ICD-10-CM | POA: Diagnosis not present

## 2021-09-26 ENCOUNTER — Encounter: Payer: Self-pay | Admitting: Urology

## 2021-09-26 NOTE — Progress Notes (Signed)
Telephone appointment . I verified patient's identity and began nursing interview. Patient reports occasional, mild SOB. No other issues reported at this time.  Meaningful use complete.  Reminded patient of his 8:30am-09/27/21 telephone appointment w/ Ashlyn Bruning PA-C. I left my extension [phone] in case patient needs anything.Patient verbalized understanding.  Patient contact [phone]

## 2021-09-27 ENCOUNTER — Ambulatory Visit
Admission: RE | Admit: 2021-09-27 | Discharge: 2021-09-27 | Disposition: A | Discharge status: Home / Self Care | Payer: Medicare Other | Source: Ambulatory Visit | Attending: Urology | Admitting: Urology

## 2021-09-27 DIAGNOSIS — C3431 Malignant neoplasm of lower lobe, right bronchus or lung: Secondary | ICD-10-CM

## 2021-09-27 NOTE — Progress Notes (Signed)
Radiation Oncology         [phone]) [phone] ________________________________  Name: Paul [name]. 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  The patient was notified in advance and gave permission to proceed with this visit format. He tolerated his treatment well without any ill side effects and remains without complaints.   He had a recent follow up CT chest on 09/24/21 which shows that the treated lesion in the right lower lobe is stable with radiation changes only and the small 7 mm nodule in the left upper lobe is stable.  There is a new, 1 x 1 cm nodule of indeterminate significance, in the right upper lobe lung, based against the pleural surface and partially against the fissure.  This could be inflammatory but warrants follow-up with a short interval CT chest scan in 3 months.                          On review of systems, the patient states that he is doing well in general.  He specifically denies chest pain, productive cough, hemoptysis, fever, chills or night sweats.  He has COPD and has noticed some increased shortness of breath with exertion over the past year, being followed by Dr. Melvyn Novas with [facility] pulmonology in [place]. He reports a healthy appetite and is maintaining his weight.  He denies abdominal pain, nausea, vomiting, diarrhea or constipation.  Overall, he is pleased with his progress to date.  ALLERGIES:  is allergic to codeine,  morphine sulfate, and mupirocin.  Meds: Current Outpatient Medications  Medication Sig Dispense Refill   acetaminophen (TYLENOL) 500 MG tablet Take 1,000 mg by mouth daily.     albuterol (PROVENTIL HFA;VENTOLIN HFA) 108 (90 Base) MCG/ACT inhaler Inhale 2 puffs into the lungs every 4 (four) hours as needed for wheezing or shortness of breath.     atorvastatin (LIPITOR) 80 MG tablet Take 80 mg by mouth daily.     busPIRone (BUSPAR) 10 MG tablet Take 5 mg by mouth 2 (two) times daily.      carbamide peroxide (DEBROX) 6.5 % otic solution Place 5 drops into both ears daily as needed (for ear vwax removal).     carvedilol (COREG) 25 MG tablet Take 12.5 mg by mouth 2 (two) times daily.     cetirizine (ZYRTEC) 10 MG tablet Take 10 mg by mouth daily.     Cholecalciferol (VITAMIN D3) 2000 units TABS Take 2 tablets by mouth daily.     diclofenac sodium (VOLTAREN) 1 % GEL Apply 2 g topically 4 (four) times daily as needed (painful joints).      divalproex (DEPAKOTE) 500 MG 24 hr tablet Take 1,500 mg by mouth at bedtime.     finasteride (PROSCAR) 5 MG tablet Take 5 mg by mouth daily.     folic acid (FOLVITE) 1 MG tablet Take 1 mg by mouth daily.  furosemide (LASIX) 40 MG tablet Take 1 tablet (40 mg total) by mouth daily. 90 tablet 3   gabapentin (NEURONTIN) 300 MG capsule Take 300-600 mg by mouth at bedtime.      hydroxypropyl methylcellulose (ISOPTO TEARS) 2.5 % ophthalmic solution Place 1 drop into both eyes 4 (four) times daily as needed for dry eyes.     Melatonin 10 MG TABS Take 1 tablet by mouth at bedtime.      Misc Natural Products (OSTEO BI-FLEX JOINT SHIELD PO) Take 2 tablets by mouth daily.     Multiple Vitamin (MULTIVITAMIN) tablet Take 1 tablet by mouth daily.     nitroGLYCERIN (NITROSTAT) 0.4 MG SL tablet Place 0.4 mg under the tongue every 5 (five) minutes as needed.     Omega-3 Fatty Acids (FISH OIL) 1000 MG CAPS Take 1 capsule by mouth daily.     omeprazole (PRILOSEC) 20 MG capsule  Take 20 mg by mouth daily.     potassium chloride SA (K-DUR,KLOR-CON) 20 MEQ tablet Take 1 tablet (20 mEq total) by mouth daily as needed (In the days when you take Lasix).     Tiotropium Bromide-Olodaterol (STIOLTO RESPIMAT) 2.5-2.5 MCG/ACT AERS Inhale 2 puffs into the lungs daily.     Tiotropium Bromide-Olodaterol (STIOLTO RESPIMAT) 2.5-2.5 MCG/ACT AERS Inhale 2 puffs into the lungs daily. 4 g 0   No current facility-administered medications for this encounter.    Physical Findings:  vitals were not taken for this visit.  Pain Assessment Pain Score: 0-No pain/10 Unable to assess due to telephone follow-up visit format.  Lab Findings: Lab Results  Component Value Date   WBC 8.2 03/18/2021   HGB 15.3 03/18/2021   HCT 45.3 03/18/2021   MCV 86 03/18/2021   PLT 244 03/18/2021     Radiographic Findings: No results found.  Impression/Plan: 1. 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 He has recovered well from the effects of his SBRT and remains without complaints.  We reviewed his recent CT chest findings which overall appear stable.  There is a new, 1 x 1 cm nodule of indeterminate significance, in the right upper lobe lung, based against the pleural surface and partially against the fissure that could be inflammatory but warrants follow-up with a short interval CT chest scan in 3 months. Therefore, we will coordinate for a repeat CT chest at [facility] in November 2023 and I will follow-up with him by telephone thereafter to review results.  Pending this scan is stable, we will then resume serial CT chest imaging every 6 months to continue to monitor closely for any evidence of disease progression or recurrence.  He does have to make arrangements for transportation since he does not drive himself so we will make sure he has ample notification prior to each scan. He knows that he is welcome to call at anytime in the interim  with any questions or concerns related to his previous radiation.  He appears to have a good understanding of our recommendations and is comfortable and in agreement with the stated plan. 2. COPD. Patient having some increased shortness of breath with activity and is under the care of Dr. Melvyn Novas, pulmonologist at [facility] pulmonary in [place]. We will continue to follow expectantly.  I personally spent 20 minutes in this encounter including chart review, reviewing radiological studies, telephone conversation with the patient, entering orders and completing documentation.     [name], PA-C

## 2021-09-30 ENCOUNTER — Ambulatory Visit (INDEPENDENT_AMBULATORY_CARE_PROVIDER_SITE_OTHER): Payer: Medicare Other

## 2021-09-30 DIAGNOSIS — Z95810 Presence of automatic (implantable) cardiac defibrillator: Secondary | ICD-10-CM | POA: Diagnosis not present

## 2021-09-30 DIAGNOSIS — I5022 Chronic systolic (congestive) heart failure: Secondary | ICD-10-CM | POA: Diagnosis not present

## 2021-09-30 NOTE — Progress Notes (Signed)
EPIC Encounter for ICM Monitoring  Patient Name: Paul [name]. is a 81 y.o. male Date: 09/30/2021 Primary Care Physican: Tiana Loft, [place] Primary Cardiologist: Domenic Polite Electrophysiologist: Mealor Bi-V Pacing: 99.8%         08/20/2021 Weight: 145 lbs                                                            Spoke with patient and heart failure questions reviewed.  Pt asymptomatic for fluid accumulation.      Optivol thoracic impedance suggesting possible fluid accumulation since 8/18.     Prescribed:  Furosemide 40 mg take 1 tablet (40 mg total) by mouth daily.  7/18 Pt reports he is taking 20 mg daily instead of 40 mg daily because higher dosage keeps him in the bathroom too much.  Potassium 20 mEq Take 1 tablet (20 mEq total) by mouth daily as needed (In the days when you take Lasix).   Labs: 03/18/2021 Creatinine 0.93, BUN 9, Potassium 4.5, Sodium 138, GFR 83 A complete set of results can be found in Results Review.   Recommendations:  Advised to take 40 mg of Furosemide with Potassium for the next 2 days instead of his self adjusted does of 20 mg daily.     Follow-up plan: ICM clinic phone appointment on 10/08/2021.   91 day device clinic remote transmission 10/18/2021.     EP/Cardiology Office Visits:     Recall 04/03/2022 with Dr. Domenic Polite.     Copy of ICM check sent to Dr Myles Gip (replacing Dr. Rayann Heman) and Dr Domenic Polite as Juluis Rainier.      3 month ICM trend: 09/30/2021.    12-14 Month ICM trend:     [name], RN 09/30/2021 4:28 PM

## 2021-10-08 ENCOUNTER — Ambulatory Visit (INDEPENDENT_AMBULATORY_CARE_PROVIDER_SITE_OTHER): Payer: Medicare Other

## 2021-10-08 DIAGNOSIS — Z95810 Presence of automatic (implantable) cardiac defibrillator: Secondary | ICD-10-CM

## 2021-10-08 DIAGNOSIS — I5022 Chronic systolic (congestive) heart failure: Secondary | ICD-10-CM

## 2021-10-08 NOTE — Progress Notes (Signed)
EPIC Encounter for ICM Monitoring  Patient Name: Paul [name]. is a 81 y.o. male Date: 10/08/2021 Primary Care Physican: Tiana Loft, [place] Primary Cardiologist: Domenic Polite Electrophysiologist: Mealor Bi-V Pacing: 99.7%         10/08/2021 Weight: 132 lbs                                                            Spoke with patient and heart failure questions reviewed.  Pt asymptomatic for fluid accumulation.   Pt has not been taking Potassium when taking Lasix and has called PCP for new prescription.     Optivol thoracic impedance suggesting fluid levels are worse since 8/28 ICM remote transmission and continues to suggest ongoing possible fluid accumulation since 8/18.     Prescribed:  Furosemide 40 mg take 1 tablet (40 mg total) by mouth daily.  7/18 Pt reports he is taking 20 mg daily instead of 40 mg daily because higher dosage keeps him in the bathroom too much.  Potassium 20 mEq Take 1 tablet (20 mEq total) by mouth daily as needed (In the days when you take Lasix).   Labs: 03/18/2021 Creatinine 0.93, BUN 9, Potassium 4.5, Sodium 138, GFR 83 A complete set of results can be found in Results Review.   Recommendations:  He did take extra Furosemide for 2 days which helped improve impedance but is waiting on new Potassium.  Advised to take Furosemide 40 mg daily instead of cutting the dosage to 20 mg daily.     Follow-up plan: ICM clinic phone appointment on 10/21/2021 to recheck fluid levels.   91 day device clinic remote transmission 10/18/2021.     EP/Cardiology Office Visits:     Recall 04/03/2022 with Dr. Domenic Polite.     Copy of ICM check sent to Dr Myles Gip (replacing Dr. Rayann Heman) and Dr Domenic Polite as Juluis Rainier.     3 month ICM trend: 10/08/2021.    12-14 Month ICM trend:     [name], RN 10/08/2021 11:09 AM

## 2021-10-18 ENCOUNTER — Ambulatory Visit (INDEPENDENT_AMBULATORY_CARE_PROVIDER_SITE_OTHER): Payer: No Typology Code available for payment source

## 2021-10-18 DIAGNOSIS — I429 Cardiomyopathy, unspecified: Secondary | ICD-10-CM | POA: Diagnosis not present

## 2021-10-18 LAB — CUP PACEART REMOTE DEVICE CHECK
Battery Remaining Longevity: 51 mo
Battery Voltage: 2.97 V
Brady Statistic AP VP Percent: 14.82 %
Brady Statistic AP VS Percent: 0.04 %
Brady Statistic AS VP Percent: 85.08 %
Brady Statistic AS VS Percent: 0.06 %
Brady Statistic RA Percent Paced: 14.18 %
Brady Statistic RV Percent Paced: 99.81 %
Date Time Interrogation Session: 20230915052726
HighPow Impedance: 53 Ohm
HighPow Impedance: 66 Ohm
Implantable Lead Implant Date: 20081014
Implantable Lead Implant Date: 20081014
Implantable Lead Implant Date: 20081014
Implantable Lead Location: 753858
Implantable Lead Location: 753859
Implantable Lead Location: 753860
Implantable Lead Model: 4542
Implantable Lead Model: 5076
Implantable Lead Model: 6947
Implantable Lead Serial Number: 128499
Implantable Pulse Generator Implant Date: 20191025
Lead Channel Impedance Value: 399 Ohm
Lead Channel Impedance Value: 418 Ohm
Lead Channel Impedance Value: 513 Ohm
Lead Channel Impedance Value: 513 Ohm
Lead Channel Impedance Value: 551 Ohm
Lead Channel Impedance Value: 893 Ohm
Lead Channel Pacing Threshold Amplitude: 0.625 V
Lead Channel Pacing Threshold Amplitude: 0.75 V
Lead Channel Pacing Threshold Amplitude: 1 V
Lead Channel Pacing Threshold Pulse Width: 0.4 ms
Lead Channel Pacing Threshold Pulse Width: 0.4 ms
Lead Channel Pacing Threshold Pulse Width: 0.4 ms
Lead Channel Sensing Intrinsic Amplitude: 1.375 mV
Lead Channel Sensing Intrinsic Amplitude: 1.375 mV
Lead Channel Sensing Intrinsic Amplitude: 20 mV
Lead Channel Sensing Intrinsic Amplitude: 20 mV
Lead Channel Setting Pacing Amplitude: 1.25 V
Lead Channel Setting Pacing Amplitude: 1.5 V
Lead Channel Setting Pacing Amplitude: 2.25 V
Lead Channel Setting Pacing Pulse Width: 0.4 ms
Lead Channel Setting Pacing Pulse Width: 0.4 ms
Lead Channel Setting Sensing Sensitivity: 0.3 mV

## 2021-10-21 ENCOUNTER — Telehealth: Payer: Self-pay

## 2021-10-21 ENCOUNTER — Ambulatory Visit (INDEPENDENT_AMBULATORY_CARE_PROVIDER_SITE_OTHER): Payer: Medicare Other

## 2021-10-21 DIAGNOSIS — Z95810 Presence of automatic (implantable) cardiac defibrillator: Secondary | ICD-10-CM

## 2021-10-21 DIAGNOSIS — I5022 Chronic systolic (congestive) heart failure: Secondary | ICD-10-CM

## 2021-10-21 NOTE — Progress Notes (Unsigned)
EPIC Encounter for ICM Monitoring  Patient Name: Paul [name]. is a 81 y.o. male Date: 10/21/2021 Primary Care Physican: Tiana Loft, [place] Primary Cardiologist: Domenic Polite Electrophysiologist: Mealor Bi-V Pacing: 99.9%         10/08/2021 Weight: 132 lbs                                                            Attempted call to patient and unable to reach.  Left detailed message per DPR regarding transmission. Transmission reviewed.    Optivol thoracic impedance suggest ongoing possible fluid accumulation since 8/15.     Prescribed:  Furosemide 40 mg take 1 tablet (40 mg total) by mouth daily.  7/18 Pt reports he is taking 20 mg daily instead of 40 mg daily because higher dosage keeps him in the bathroom too much.  Potassium 20 mEq Take 1 tablet (20 mEq total) by mouth daily as needed (In the days when you take Lasix).   Labs: 03/18/2021 Creatinine 0.93, BUN 9, Potassium 4.5, Sodium 138, GFR 83 A complete set of results can be found in Results Review.   Recommendations:  Unable to reach.     Follow-up plan: ICM clinic phone appointment on 10/25/2021 to recheck fluid levels.   91 day device clinic remote transmission 01/17/2022.     EP/Cardiology Office Visits:     Recall 04/03/2022 with Dr. Domenic Polite.     Copy of ICM check sent to Dr Myles Gip (replacing Dr. Rayann Heman) and Dr Domenic Polite for review if patient is reached.      3 month ICM trend: 10/21/2021.    12-14 Month ICM trend:     [name], RN 10/21/2021 3:56 PM

## 2021-10-21 NOTE — Telephone Encounter (Signed)
Remote ICM transmission received.  Attempted call to patient regarding ICM remote transmission and left detailed message per DPR to return call.   

## 2021-10-25 ENCOUNTER — Ambulatory Visit (INDEPENDENT_AMBULATORY_CARE_PROVIDER_SITE_OTHER): Payer: Medicare Other

## 2021-10-25 ENCOUNTER — Telehealth: Payer: Self-pay

## 2021-10-25 DIAGNOSIS — Z95810 Presence of automatic (implantable) cardiac defibrillator: Secondary | ICD-10-CM

## 2021-10-25 DIAGNOSIS — I5022 Chronic systolic (congestive) heart failure: Secondary | ICD-10-CM

## 2021-10-25 NOTE — Telephone Encounter (Signed)
Remote ICM transmission received.  Attempted call to patient regarding ICM remote transmission and left detailed message per DPR.  Advised to return call for any fluid symptoms or questions. Next ICM remote transmission scheduled 10/29/2021.

## 2021-10-25 NOTE — Progress Notes (Signed)
EPIC Encounter for ICM Monitoring  Patient Name: Paul [name]. is a 81 y.o. male Date: 10/25/2021 Primary Care Physican: Tiana Loft, [place] Primary Cardiologist: Domenic Polite Electrophysiologist: Mealor Bi-V Pacing: 99.5%         10/08/2021 Weight: 132 lbs                                                            Attempted call to patient and unable to reach.  Left detailed message per DPR regarding transmission. Transmission reviewed.    Optivol thoracic impedance suggest ongoing possible fluid accumulation since 8/15.   Fluid index > normal threshold sine 8/26.   Prescribed:  Furosemide 40 mg take 1 tablet (40 mg total) by mouth daily.  7/18 Pt reports he is taking 20 mg daily instead of 40 mg daily because higher dosage keeps him in the bathroom too much.  Potassium 20 mEq Take 1 tablet (20 mEq total) by mouth daily as needed (In the days when you take Lasix).   Labs: 03/18/2021 Creatinine 0.93, BUN 9, Potassium 4.5, Sodium 138, GFR 83 A complete set of results can be found in Results Review.   Recommendations:  Left voice mail with ICM number and encouraged to call if experiencing any fluid symptoms.   Follow-up plan: ICM clinic phone appointment on 10/29/2021 [facility] recheck fluid levels.   91 day device clinic remote transmission 01/17/2022.     EP/Cardiology Office Visits:     Recall 04/03/2022 with Dr. Domenic Polite.     Copy of ICM check sent to Dr Myles Gip and Dr Domenic Polite for review if patient is reached.   3 month ICM trend: 10/25/2021.    12-14 Month ICM trend:     [name], RN 10/25/2021 8:04 AM

## 2021-10-29 NOTE — Progress Notes (Signed)
Remote ICD transmission.   

## 2021-10-30 NOTE — Progress Notes (Signed)
No ICM remote transmission received for 10/29/2021 and next ICM transmission scheduled for 11/04/2021.

## 2021-11-04 ENCOUNTER — Telehealth: Payer: Self-pay

## 2021-11-04 NOTE — Telephone Encounter (Signed)
Spoke with patient and reports he has not been feeling very lethargic for past 2 weeks.  He has only been taking fluid pill with potassium in the last 2 days.  Advised to send remote transmission to review fluid levels. He reports drinking about 90-100 oz fluid daily and does have some SOB. Weight is 130 lbs.  He declined to send remote transmission for review rather have report reviewed in a month. Next ICM remote transmission rescheduled for 10/23 per patient request.

## 2021-11-19 ENCOUNTER — Ambulatory Visit (INDEPENDENT_AMBULATORY_CARE_PROVIDER_SITE_OTHER): Payer: No Typology Code available for payment source

## 2021-11-19 DIAGNOSIS — I5022 Chronic systolic (congestive) heart failure: Secondary | ICD-10-CM | POA: Diagnosis not present

## 2021-11-19 DIAGNOSIS — Z95810 Presence of automatic (implantable) cardiac defibrillator: Secondary | ICD-10-CM | POA: Diagnosis not present

## 2021-11-19 NOTE — Progress Notes (Unsigned)
EPIC Encounter for ICM Monitoring  Patient Name: Paul [name]. is a 81 y.o. male Date: 11/19/2021 Primary Care Physican: [facility] Primary Cardiologist: Domenic Polite Electrophysiologist: Mealor Bi-V Pacing: 99.5%         10/08/2021 Weight: 132 lbs 11/20/2021 Weight: 130 lbs                                                              Spoke with patient and heart failure questions reviewed.  Transmission results reviewed.  Pt asymptomatic for fluid accumulation.     Optivol thoracic impedance suggest fluid levels returned to normal since he has been taking Furosemide 40 mg as prescribed.   Prescribed:  Furosemide 40 mg take 1 tablet (40 mg total) by mouth daily.   Potassium 20 mEq Take 1 tablet (20 mEq total) by mouth daily as needed (In the days when you take Lasix).   Labs: 03/18/2021 Creatinine 0.93, BUN 9, Potassium 4.5, Sodium 138, GFR 83 A complete set of results can be found in Results Review.   Recommendations:  Advised he should continue to take Furosemide as prescribed which is 40 mg daily. No changes and encouraged to call if experiencing any fluid symptoms.   Follow-up plan: ICM clinic phone appointment on 12/23/2021.   91 day device clinic remote transmission 01/17/2022.     EP/Cardiology Office Visits:     Recall 04/03/2022 with Dr. Domenic Polite.     Copy of ICM check sent to Dr Myles Gip.   3 month ICM trend: 11/18/2021.    12-14 Month ICM trend:     [name], RN 11/19/2021 8:05 AM

## 2021-11-20 ENCOUNTER — Telehealth: Payer: Self-pay | Admitting: Internal Medicine

## 2021-11-20 NOTE — Telephone Encounter (Signed)
Patient was calling to speak to you. Please advise

## 2021-11-20 NOTE — Telephone Encounter (Signed)
Se ICM note for return call.

## 2021-12-13 ENCOUNTER — Ambulatory Visit: Payer: Medicare Other | Admitting: Internal Medicine

## 2021-12-23 ENCOUNTER — Ambulatory Visit (INDEPENDENT_AMBULATORY_CARE_PROVIDER_SITE_OTHER): Payer: No Typology Code available for payment source

## 2021-12-23 DIAGNOSIS — I5022 Chronic systolic (congestive) heart failure: Secondary | ICD-10-CM | POA: Diagnosis not present

## 2021-12-23 DIAGNOSIS — Z95810 Presence of automatic (implantable) cardiac defibrillator: Secondary | ICD-10-CM | POA: Diagnosis not present

## 2021-12-25 ENCOUNTER — Telehealth: Payer: Self-pay

## 2021-12-25 NOTE — Telephone Encounter (Signed)
Remote ICM transmission received.  Attempted call to patient regarding ICM remote transmission and left detailed message per DPR.  Advised to return call for any fluid symptoms or questions. Next ICM remote transmission scheduled 1/82024.

## 2021-12-25 NOTE — Progress Notes (Signed)
EPIC Encounter for ICM Monitoring  Patient Name: Paul [name]. is a 81 y.o. male Date: 12/25/2021 Primary Care Physican: [facility] Primary Cardiologist: Domenic Polite Electrophysiologist: Mealor Bi-V Pacing: 99.5%         10/08/2021 Weight: 132 lbs 11/20/2021 Weight: 130 lbs                                                              Attempted call to patient and unable to reach.  Left detailed message per DPR regarding transmission. Transmission reviewed.      Optivol thoracic impedance suggest normal fluid levels.   Prescribed:  Furosemide 40 mg take 1 tablet (40 mg total) by mouth daily.   Potassium 20 mEq Take 1 tablet (20 mEq total) by mouth daily as needed (In the days when you take Lasix).   Labs: 03/18/2021 Creatinine 0.93, BUN 9, Potassium 4.5, Sodium 138, GFR 83 A complete set of results can be found in Results Review.   Recommendations:  Left voice mail with ICM number and encouraged to call if experiencing any fluid symptoms.   Follow-up plan: ICM clinic phone appointment on 02/10/2022.   91 day device clinic remote transmission 01/17/2022.     EP/Cardiology Office Visits:     Recall 04/03/2022 with Dr. Domenic Polite.  02/21/2022 with Dr Myles Gip.    Copy of ICM check sent to Dr Myles Gip.    3 month ICM trend: 12/23/2021.    12-14 Month ICM trend:     [name], RN 12/25/2021 4:29 PM

## 2021-12-27 DIAGNOSIS — R911 Solitary pulmonary nodule: Secondary | ICD-10-CM | POA: Diagnosis not present

## 2021-12-27 DIAGNOSIS — J439 Emphysema, unspecified: Secondary | ICD-10-CM | POA: Diagnosis not present

## 2021-12-27 DIAGNOSIS — I700 Atherosclerosis of aorta: Secondary | ICD-10-CM | POA: Diagnosis not present

## 2021-12-27 DIAGNOSIS — C3431 Malignant neoplasm of lower lobe, right bronchus or lung: Secondary | ICD-10-CM | POA: Diagnosis not present

## 2021-12-27 DIAGNOSIS — R918 Other nonspecific abnormal finding of lung field: Secondary | ICD-10-CM | POA: Diagnosis not present

## 2021-12-30 IMAGING — US CT BIOPSY
1 series · 10 of 10 positions shown · non-contrast
Comparison: none

INDICATION: 1.8 cm right lower PET positive pulmonary nodule

[Series 1: ct biopsy · 10 acquisitions, 10 frames shown]
[im 1/10]
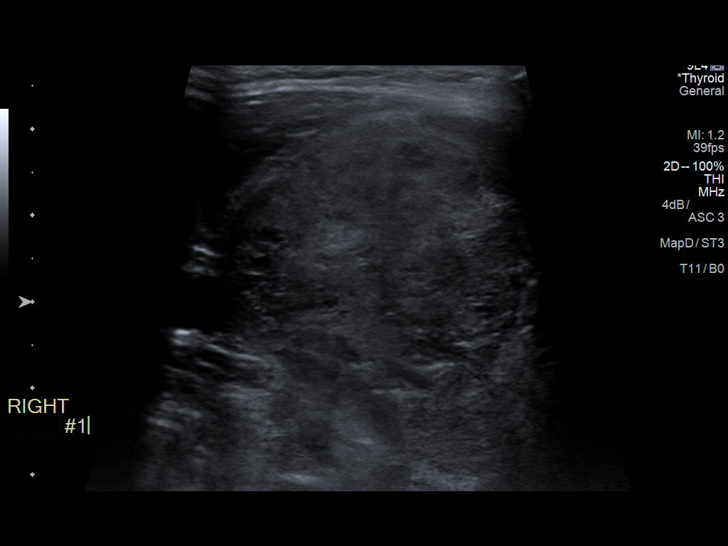
[im 2/10]
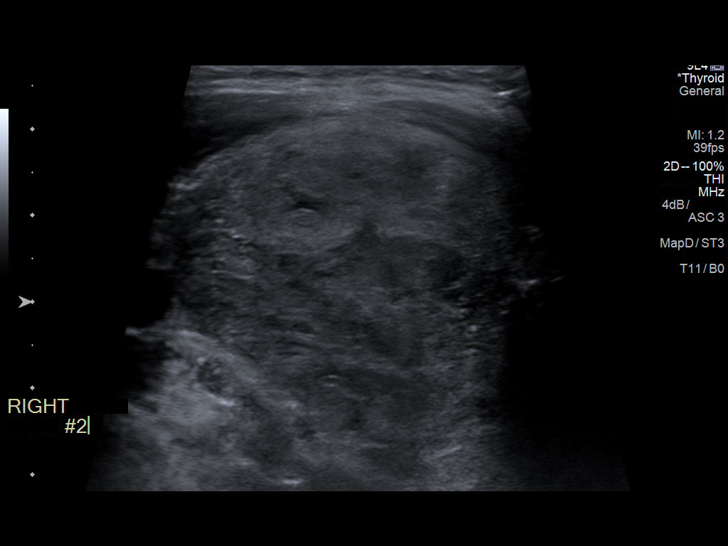
[im 3/10]
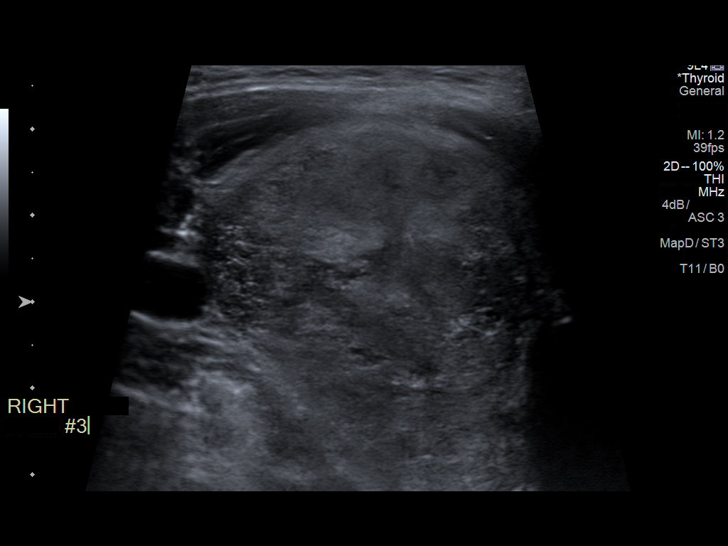
[im 4/10]
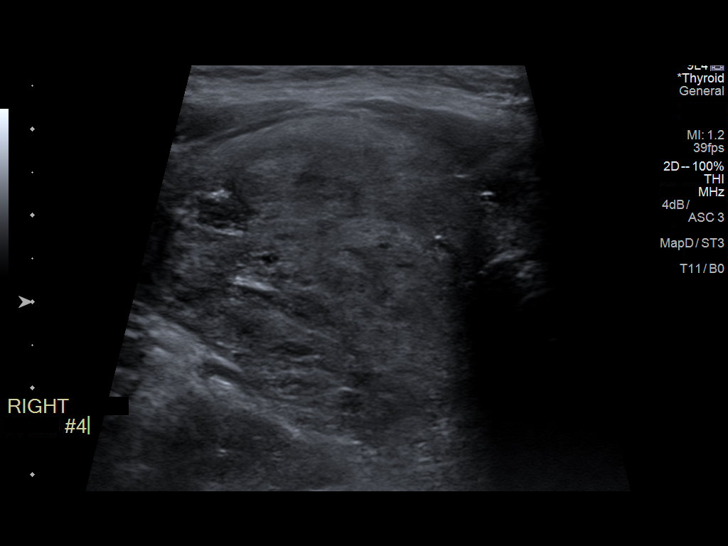
[im 5/10]
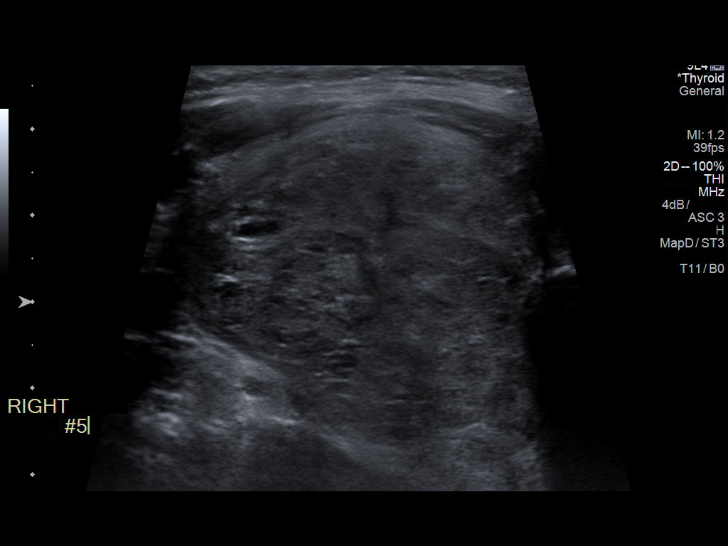
[im 6/10]
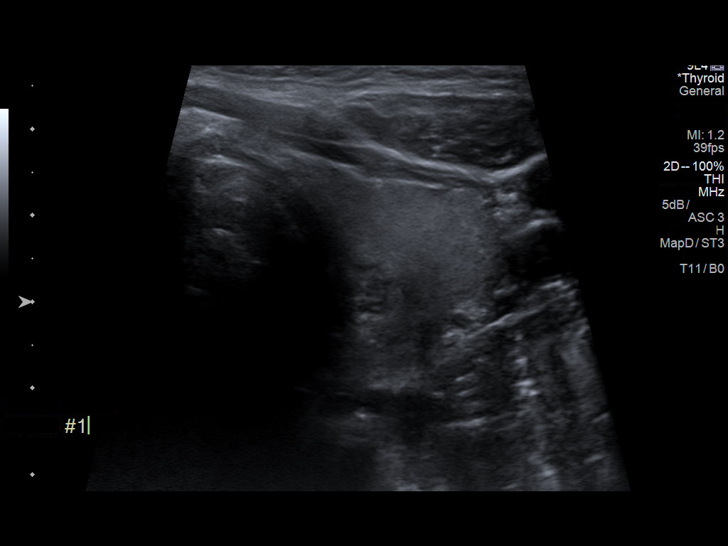
[im 7/10]
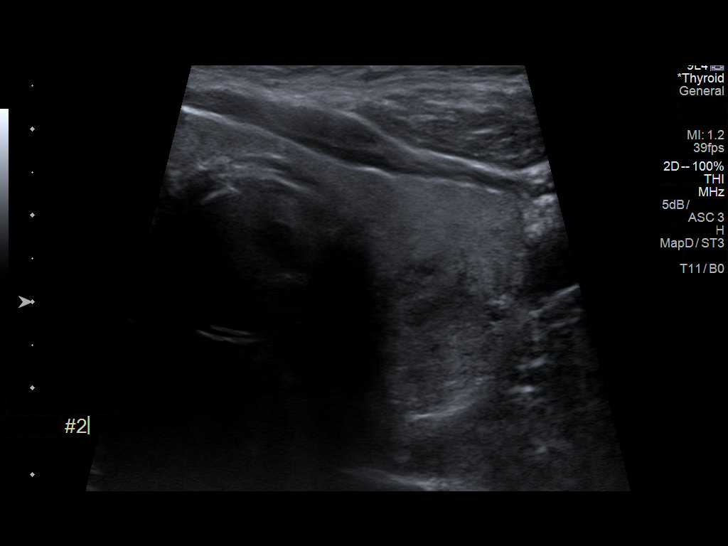
[im 8/10]
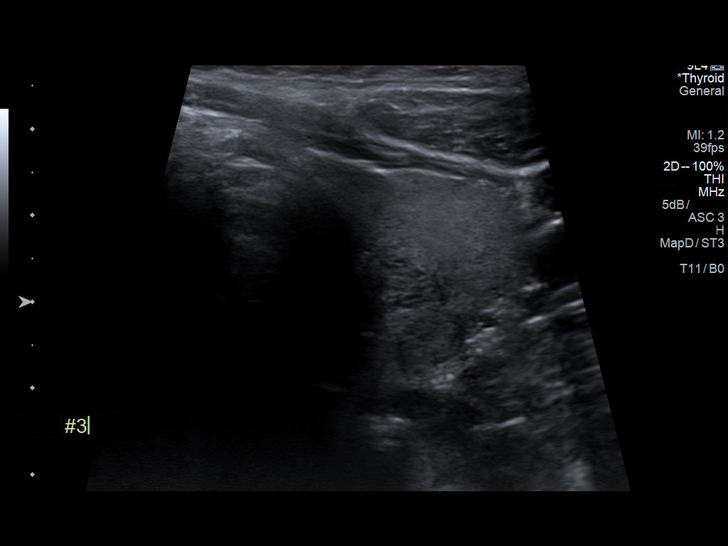
[im 9/10]
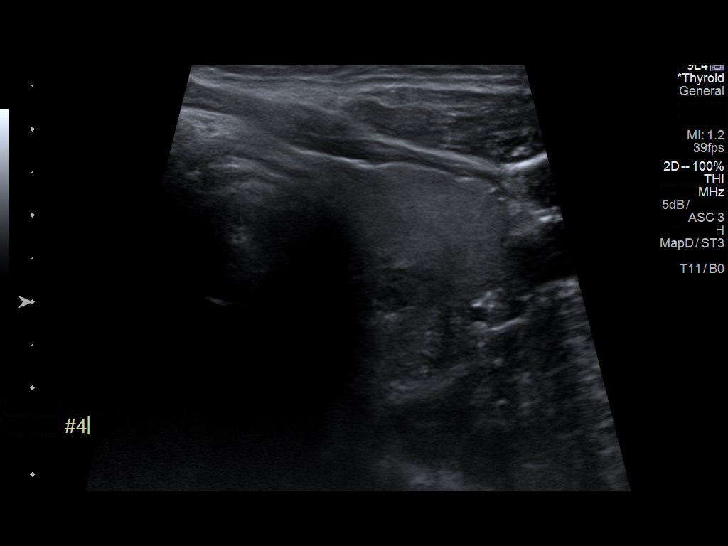
[im 10/10]
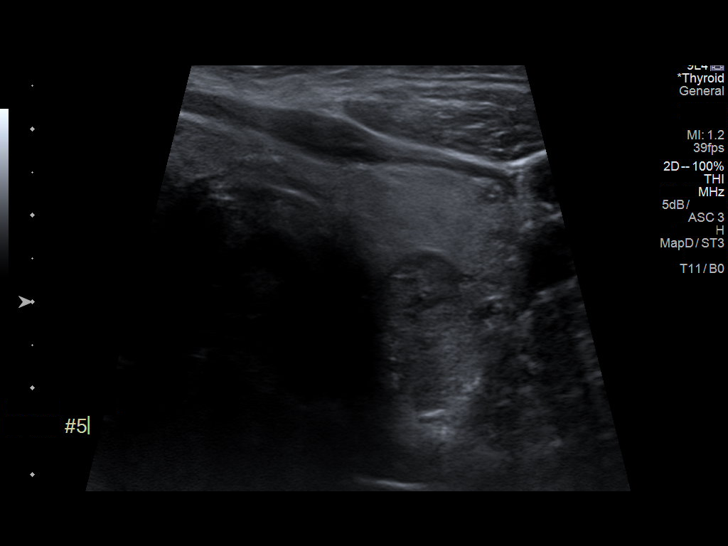

[10 of 10 positions shown; findings below may reference images not displayed]

EXAM:
CT-GUIDED BIOPSY RIGHT LOWER POSTERIOR NODULE

MEDICATIONS:
1% lidocaine

ANESTHESIA/SEDATION:
2.0 mg IV Versed; 100 mcg IV Fentanyl

Moderate Sedation Time:  23 minutes

The patient was continuously monitored during the procedure by the
interventional radiology nurse under my direct supervision.

PROCEDURE:
The procedure, risks, benefits, and alternatives were explained to
the patient. Questions regarding the procedure were encouraged and
answered. The patient understands and consents to the procedure.

Previous imaging reviewed. Patient positioned prone. Noncontrast
localization CT performed. The posterior right lower lobe nodule was
localized and marked.

Under sterile conditions and local anesthesia, a 17 gauge access
needle was advanced from a posterior intercostal approach to the
nodule. Needle position confirmed with CT. 1 cm 18 gauge core biopsy
attempted. Sampling was small and fragmented. Samples placed in
formalin. Postprocedure imaging demonstrates a small amount right
lower lobe pulmonary hemorrhage from the biopsy. Needle tract
occluded with the biosentry device. No large effusion or
pneumothorax.

Patient tolerated the procedure well without complication. Vital
sign monitoring by nursing staff during the procedure will continue
as patient is in the special procedures unit for post procedure
observation.
FINDINGS: The images document guide needle placement within the posterior
right lower lobe nodule. Post biopsy images demonstrate biopsy
related pulmonary hemorrhage.

COMPLICATIONS:
SIR Level A - No therapy, no consequence.
IMPRESSION: Successful CT-guided core biopsy of the right lower lobe pulmonary
nodule

## 2022-01-01 ENCOUNTER — Ambulatory Visit
Admission: RE | Admit: 2022-01-01 | Discharge: 2022-01-01 | Disposition: A | Discharge status: Home / Self Care | Payer: Medicare Other | Source: Ambulatory Visit | Attending: Urology | Admitting: Urology

## 2022-01-01 ENCOUNTER — Ambulatory Visit: Payer: Self-pay | Admitting: Urology

## 2022-01-01 ENCOUNTER — Telehealth: Payer: Self-pay | Admitting: Urology

## 2022-01-01 DIAGNOSIS — C3431 Malignant neoplasm of lower lobe, right bronchus or lung: Secondary | ICD-10-CM

## 2022-01-01 NOTE — Telephone Encounter (Signed)
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  Unfortunately, the patient was unavailable for today's telephone visit but I was able to leave a detailed voicemail message with both him and [name], to inform them that overall, this scan appears stable with a stable appearance of the previously treated RLL nodule and a stable appearance of the previously noted 1 cm nodule in the right upper lobe lung, based against the pleural surface and partially against the fissure.  There is a new, tiny 7 mm nodule in the LLL of unknown significance and felt possibly due to infection or aspiration so the recommendation is to repeat a scan in 3 months to ensure stability or possibly, resolution.  They were advised to call back with any questions or concerns regarding this information and plan but that otherwise, we will be in contact to coordinate a follow-up CT chest scan in February 2024 with a telephone follow-up visit thereafter to review those results.  Impression/Plan: 1.         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 He was unavailable for today's telephone follow-up visit so I left a detailed voicemail message regarding the results of his recent CT chest scan which overall appears stable but is deserving of close follow-up.  The plan is to repeat a CT chest scan at [facility] in February 2024 and I will call him with those  results as soon as they are available.  Pending this scan is stable, we will then resume serial CT chest imaging every 6 months to continue to monitor closely for any evidence of disease progression or recurrence.  He does have to make arrangements for transportation since he does not drive himself so we will make sure he has ample notification prior to each scan. He knows that he is welcome to call at anytime in the interim with any questions or concerns related to his previous radiation.  He appears to have a good understanding of our recommendations and is comfortable and in agreement with the stated plan. 2.         COPD. Patient is under the care of Dr. Melvyn Novas, pulmonologist at [facility] pulmonary in [place]. We will continue to follow expectantly.      [name], PA-C

## 2022-01-01 NOTE — Progress Notes (Signed)
2nd call. Patient unavailable for today's 10:30am-01/01/22 telephone appointment w/ Ashlyn Bruning PA-C. A voicemail was left to return my call.    Leandra Kern, LPN

## 2022-01-09 ENCOUNTER — Ambulatory Visit: Payer: Medicare Other | Admitting: Internal Medicine

## 2022-01-17 ENCOUNTER — Ambulatory Visit (INDEPENDENT_AMBULATORY_CARE_PROVIDER_SITE_OTHER): Payer: No Typology Code available for payment source

## 2022-01-17 DIAGNOSIS — I429 Cardiomyopathy, unspecified: Secondary | ICD-10-CM

## 2022-01-17 LAB — CUP PACEART REMOTE DEVICE CHECK
Battery Remaining Longevity: 40 mo
Battery Voltage: 2.97 V
Brady Statistic AP VP Percent: 4.78 %
Brady Statistic AP VS Percent: 0.04 %
Brady Statistic AS VP Percent: 95.01 %
Brady Statistic AS VS Percent: 0.17 %
Brady Statistic RA Percent Paced: 4.74 %
Brady Statistic RV Percent Paced: 99.35 %
Date Time Interrogation Session: 20231215001603
HighPow Impedance: 53 Ohm
HighPow Impedance: 71 Ohm
Implantable Lead Connection Status: 753985
Implantable Lead Connection Status: 753985
Implantable Lead Connection Status: 753985
Implantable Lead Implant Date: 20081014
Implantable Lead Implant Date: 20081014
Implantable Lead Implant Date: 20081014
Implantable Lead Location: 753858
Implantable Lead Location: 753859
Implantable Lead Location: 753860
Implantable Lead Model: 4542
Implantable Lead Model: 5076
Implantable Lead Model: 6947
Implantable Lead Serial Number: 128499
Implantable Pulse Generator Implant Date: 20191025
Lead Channel Impedance Value: 361 Ohm
Lead Channel Impedance Value: 418 Ohm
Lead Channel Impedance Value: 456 Ohm
Lead Channel Impedance Value: 475 Ohm
Lead Channel Impedance Value: 551 Ohm
Lead Channel Impedance Value: 893 Ohm
Lead Channel Pacing Threshold Amplitude: 0.5 V
Lead Channel Pacing Threshold Amplitude: 0.75 V
Lead Channel Pacing Threshold Amplitude: 1.375 V
Lead Channel Pacing Threshold Pulse Width: 0.4 ms
Lead Channel Pacing Threshold Pulse Width: 0.4 ms
Lead Channel Pacing Threshold Pulse Width: 0.4 ms
Lead Channel Sensing Intrinsic Amplitude: 1 mV
Lead Channel Sensing Intrinsic Amplitude: 1 mV
Lead Channel Sensing Intrinsic Amplitude: 16.125 mV
Lead Channel Sensing Intrinsic Amplitude: 16.125 mV
Lead Channel Setting Pacing Amplitude: 1.25 V
Lead Channel Setting Pacing Amplitude: 1.5 V
Lead Channel Setting Pacing Amplitude: 2.75 V
Lead Channel Setting Pacing Pulse Width: 0.4 ms
Lead Channel Setting Pacing Pulse Width: 0.4 ms
Lead Channel Setting Sensing Sensitivity: 0.3 mV
Zone Setting Status: 755011
Zone Setting Status: 755011

## 2022-02-06 NOTE — Progress Notes (Signed)
Remote ICD transmission.   

## 2022-02-07 ENCOUNTER — Encounter: Payer: Medicare Other | Admitting: Internal Medicine

## 2022-02-10 ENCOUNTER — Ambulatory Visit (INDEPENDENT_AMBULATORY_CARE_PROVIDER_SITE_OTHER): Payer: No Typology Code available for payment source

## 2022-02-10 DIAGNOSIS — I5022 Chronic systolic (congestive) heart failure: Secondary | ICD-10-CM | POA: Diagnosis not present

## 2022-02-10 DIAGNOSIS — Z95810 Presence of automatic (implantable) cardiac defibrillator: Secondary | ICD-10-CM

## 2022-02-13 NOTE — Progress Notes (Signed)
EPIC Encounter for ICM Monitoring  Patient Name: Paul [name]. is a 82 y.o. male Date: 02/13/2022 Primary Care Physican: [facility] Primary Cardiologist: Domenic Polite Electrophysiologist: Mealor Bi-V Pacing: 99.7%         10/08/2021 Weight: 132 lbs 11/20/2021 Weight: 130 lbs                                                              Transmission reviewed.      Optivol thoracic impedance suggest normal fluid levels with intermittent days with possible fluid accumulation within last month.   Prescribed:  Furosemide 40 mg take 1 tablet (40 mg total) by mouth daily.   Potassium 20 mEq Take 1 tablet (20 mEq total) by mouth daily as needed (In the days when you take Lasix).   Labs: 03/18/2021 Creatinine 0.93, BUN 9, Potassium 4.5, Sodium 138, GFR 83 A complete set of results can be found in Results Review.   Recommendations: No changes.   Follow-up plan: ICM clinic phone appointment on 03/17/2022.   91 day device clinic remote transmission 04/18/2022.     EP/Cardiology Office Visits:     Recall 04/03/2022 with Dr. Domenic Polite.  02/21/2022 with Dr Myles Gip.    Copy of ICM check sent to Dr Myles Gip.    3 month ICM trend: 02/10/2022.    12-14 Month ICM trend:     [name], RN 02/13/2022 4:13 PM

## 2022-02-19 ENCOUNTER — Ambulatory Visit: Payer: Medicare Other | Admitting: Internal Medicine

## 2022-02-21 ENCOUNTER — Encounter: Payer: Medicare Other | Admitting: Cardiovascular Disease

## 2022-03-11 ENCOUNTER — Encounter: Payer: Medicare Other | Admitting: Student

## 2022-03-17 ENCOUNTER — Ambulatory Visit: Payer: Medicare Other | Attending: Cardiovascular Disease

## 2022-03-17 DIAGNOSIS — I5022 Chronic systolic (congestive) heart failure: Secondary | ICD-10-CM | POA: Diagnosis not present

## 2022-03-17 DIAGNOSIS — Z95810 Presence of automatic (implantable) cardiac defibrillator: Secondary | ICD-10-CM | POA: Diagnosis not present

## 2022-03-18 NOTE — Progress Notes (Deleted)
Cardiology Office Note Date:  03/18/2022  Patient ID:  Paul [name]., DOB 1940-05-05, MRN [id number] PCP:  [facility]  Cardiologist:  Dr. Domenic Polite Electrophysiologist: Dr. Rayann Heman  Pulmonary: Dr. Melvyn Novas  ***refresh   Chief Complaint: *** annual visit  History of Present Illness: Paul [name]. is a 82 y.o. 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 Rayann Heman 02/08/21, was s/p XRT for lung ca, doing well,  was volume OL, lasix pulsed, meds adjusted.  He saw Dr. Domenic Polite 04/03/21, doing well, planned for updated echo, no changes were made.  *** symptoms *** volume *** CM meds, labs *** lipids *** new EP MD *** mcdowell *** BP %  Device information MDT CRT-D implanted 11/17/2006, gen change 11/27/2017   Past Medical History:  Diagnosis Date   BPH (benign prostatic hyperplasia)    CAD (coronary artery disease)    Distal circumflex and OM disease with collaterals 2008   Cardiomyopathy    LVEF 25% up to 65%   COPD (chronic obstructive pulmonary disease) (HCC)    Dyslipidemia    Essential hypertension    Gout    History of alcohol abuse    ICD (implantable cardiac defibrillator) battery depletion    CRT-D device   LBBB (left bundle branch block)    Lung cancer (HCC)    Squamous cell carcinoma RLL lung   Lung nodule    [facility]   Migraines    Mitral regurgitation    Severe before CRT with annular dilatation   Orthostatic syncope     Past Surgical History:  Procedure Laterality Date   BI-VENTRICULAR IMPLANTABLE CARDIOVERTER DEFIBRILLATOR N/A 12/30/2011   Procedure: BI-VENTRICULAR IMPLANTABLE CARDIOVERTER DEFIBRILLATOR  (CRT-D);  Surgeon: Thompson Grayer, MD;  Location: [facility] CATH LAB;  Service: Cardiovascular;  Laterality: N/A;   CARDIAC DEFIBRILLATOR PLACEMENT  12/29/12   BiV ICD  implanted, generator change 12/30/11 by Dr Rayann Heman MDT Protecta XT CRT-D   ORIF ANKLE FRACTURE Right 08/31/2014   Procedure: OPEN REDUCTION INTERNAL FIXATION (ORIF) ANKLE FRACTURE;  Surgeon: Marybelle Killings, MD;  Location: [place];  Service: Orthopedics;  Laterality: Right;    Current Outpatient Medications  Medication Sig Dispense Refill   acetaminophen (TYLENOL) 500 MG tablet Take 1,000 mg by mouth daily.     albuterol (PROVENTIL HFA;VENTOLIN HFA) 108 (90 Base) MCG/ACT inhaler Inhale 2 puffs into the lungs every 4 (four) hours as needed for wheezing or shortness of breath.     atorvastatin (LIPITOR) 80 MG tablet Take 80 mg by mouth daily.     busPIRone (BUSPAR) 10 MG tablet Take 5 mg by mouth 2 (two) times daily.      carbamide peroxide (DEBROX) 6.5 % otic solution Place 5 drops into both ears daily as needed (for ear vwax removal).     carvedilol (COREG) 25 MG tablet Take 12.5 mg by mouth 2 (two) times daily.     cetirizine (ZYRTEC) 10 MG tablet Take 10 mg by mouth daily.     Cholecalciferol (VITAMIN D3) 2000 units TABS Take 2 tablets by mouth daily.     diclofenac sodium (VOLTAREN) 1 % GEL Apply 2 g topically 4 (four) times daily as needed (painful joints).      divalproex (DEPAKOTE) 500 MG 24 hr tablet Take 1,500 mg by  mouth at bedtime.     finasteride (PROSCAR) 5 MG tablet Take 5 mg by mouth daily.     folic acid (FOLVITE) 1 MG tablet Take 1 mg by mouth daily.     furosemide (LASIX) 40 MG tablet Take 1 tablet (40 mg total) by mouth daily. 90 tablet 3   gabapentin (NEURONTIN) 300 MG capsule Take 300-600 mg by mouth at bedtime.      hydroxypropyl methylcellulose (ISOPTO TEARS) 2.5 % ophthalmic solution Place 1 drop into both eyes 4 (four) times daily as needed for dry eyes.     Melatonin 10 MG TABS Take 1 tablet by mouth at bedtime.      Misc Natural Products (OSTEO BI-FLEX JOINT SHIELD PO) Take 2 tablets by mouth daily.     Multiple Vitamin (MULTIVITAMIN) tablet Take 1 tablet by mouth daily.      nitroGLYCERIN (NITROSTAT) 0.4 MG SL tablet Place 0.4 mg under the tongue every 5 (five) minutes as needed.     Omega-3 Fatty Acids (FISH OIL) 1000 MG CAPS Take 1 capsule by mouth daily.     omeprazole (PRILOSEC) 20 MG capsule Take 20 mg by mouth daily.     potassium chloride SA (K-DUR,KLOR-CON) 20 MEQ tablet Take 1 tablet (20 mEq total) by mouth daily as needed (In the days when you take Lasix).     Tiotropium Bromide-Olodaterol (STIOLTO RESPIMAT) 2.5-2.5 MCG/ACT AERS Inhale 2 puffs into the lungs daily.     Tiotropium Bromide-Olodaterol (STIOLTO RESPIMAT) 2.5-2.5 MCG/ACT AERS Inhale 2 puffs into the lungs daily. 4 g 0   No current facility-administered medications for this visit.    Allergies:   Codeine, Morphine sulfate, and Mupirocin   Social History:  The patient  reports that he quit smoking about 2 years ago. His smoking use included cigarettes and e-cigarettes. He started smoking about 66 years ago. He has a 22.50 pack-year smoking history. He has never used smokeless tobacco. He reports current alcohol use. He reports that he does not use drugs.   Family History:  The patient's family history includes Arthritis in his mother; Emphysema in his father; Lung cancer in his brother.  ROS:  Please see the history of present illness.    All other systems are reviewed and otherwise negative.   PHYSICAL EXAM:  VS:  There were no vitals taken for this visit. BMI: There is no height or weight on file to calculate BMI. Well nourished, well developed, in no acute distress HEENT: normocephalic, atraumatic Neck: no JVD, carotid bruits or masses Cardiac:  *** RRR; no significant murmurs, no rubs, or gallops Lungs:  *** CTA b/l, no wheezing, rhonchi or rales Abd: soft, nontender MS: no deformity or *** atrophy Ext: *** no edema Skin: warm and dry, no rash Neuro:  No gross deficits appreciated Psych: euthymic mood, full affect  *** ICD site is stable, no tethering or discomfort   EKG:   Done today and reviewed by myself shows  ***  Device interrogation done today and reviewed by myself:  ***   05/23/2021: TTE 1. Left ventricular ejection fraction, by estimation, is 55%. The left  ventricle has normal function. The left ventricle demonstrates regional  wall motion abnormalities (see scoring diagram/findings for description).  Left ventricular diastolic  parameters are consistent with Grade I diastolic dysfunction (impaired  relaxation).   2. Right ventricular systolic function is normal. The right ventricular  size is normal. There is normal pulmonary artery systolic pressure. The  estimated right ventricular systolic  pressure is 33.9 mmHg.   3. The mitral valve is grossly normal. Mild to moderate mitral valve  regurgitation.   4. The aortic valve is tricuspid. Aortic valve regurgitation is mild.  Aortic valve sclerosis/calcification is present, without any evidence of  aortic stenosis. Aortic regurgitation PHT measures 747 msec. Aortic valve  mean gradient measures 6.0 mmHg.   5. The inferior vena cava is normal in size with greater than 50%  respiratory variability, suggesting right atrial pressure of 3 mmHg.   Comparison(s): Prior images reviewed side by side. LVEF remains normal.     Echocardiogram 06/15/2019:  1. Left ventricular ejection fraction, by estimation, is 55 to 60%. The  left ventricle has normal function. The left ventricle has no regional  wall motion abnormalities. Left ventricular diastolic parameters are  indeterminate.   2. Right ventricular systolic function is normal. The right ventricular  size is normal. There is normal pulmonary artery systolic pressure.   3. The mitral valve is grossly normal. Mild mitral valve regurgitation.   4. The aortic valve is tricuspid. Aortic valve regurgitation is trivial.  Mild to moderate aortic valve sclerosis/calcification is present, without  any evidence of aortic stenosis.   5. The inferior vena cava  is normal in size with greater than 50%  respiratory variability, suggesting right atrial pressure of 3 mmHg.    Chest CT 03/06/2021: IMPRESSION: 1. Right lower lobe pulmonary nodule measuring 2.0 x 1.5 x 1.0 cm, not significantly changed from the prior exam and may represent patient's known non-small cell lung cancer. 2. Stable subpleural left upper lobe pulmonary nodule measuring 7 mm. No new pulmonary nodule is identified. 3. Emphysema. 4. Aortic atherosclerosis and coronary artery calcifications.   Recent Labs: No results found for requested labs within last 365 days.  No results found for requested labs within last 365 days.   CrCl cannot be calculated (Patient's most recent lab result is older than the maximum 21 days allowed.).   Wt Readings from Last 3 Encounters:  06/17/21 138 lb 9.6 oz (62.9 kg)  04/03/21 137 lb 3.2 oz (62.2 kg)  03/18/21 138 lb 0.6 oz (62.6 kg)     Other studies reviewed: Additional studies/records reviewed today include: summarized above  ASSESSMENT AND PLAN:  CTR-D ***  NICM LBBB Chronic CHF (systolic w/recovered LVEF) ***  HTN ***     Disposition: F/u with ***  Current medicines are reviewed at length with the patient today.  The patient did not have any concerns regarding medicines.  Venetia Night, PA-C 03/18/2022 1:13 PM     [address] [place] [address] [phone] (office)  [phone] (fax)

## 2022-03-20 ENCOUNTER — Encounter: Payer: Self-pay | Admitting: Cardiovascular Disease

## 2022-03-20 ENCOUNTER — Ambulatory Visit: Payer: Medicare Other | Attending: Internal Medicine | Admitting: Cardiovascular Disease

## 2022-03-20 ENCOUNTER — Encounter: Payer: Medicare Other | Admitting: Physician Assistant

## 2022-03-20 VITALS — BP 115/82 | HR 123 | Ht 67.0 in | Wt 136.0 lb

## 2022-03-20 DIAGNOSIS — I10 Essential (primary) hypertension: Secondary | ICD-10-CM | POA: Diagnosis not present

## 2022-03-20 DIAGNOSIS — I5022 Chronic systolic (congestive) heart failure: Secondary | ICD-10-CM | POA: Insufficient documentation

## 2022-03-20 LAB — CUP PACEART INCLINIC DEVICE CHECK
Battery Remaining Longevity: 37 mo
Battery Voltage: 2.96 V
Brady Statistic AP VP Percent: 5.73 %
Brady Statistic AP VS Percent: 0.03 %
Brady Statistic AS VP Percent: 94.05 %
Brady Statistic AS VS Percent: 0.2 %
Brady Statistic RA Percent Paced: 5.64 %
Brady Statistic RV Percent Paced: 99.25 %
Date Time Interrogation Session: 20240215162318
HighPow Impedance: 54 Ohm
HighPow Impedance: 75 Ohm
Implantable Lead Connection Status: 753985
Implantable Lead Connection Status: 753985
Implantable Lead Connection Status: 753985
Implantable Lead Implant Date: 20081014
Implantable Lead Implant Date: 20081014
Implantable Lead Implant Date: 20081014
Implantable Lead Location: 753858
Implantable Lead Location: 753859
Implantable Lead Location: 753860
Implantable Lead Model: 4542
Implantable Lead Model: 5076
Implantable Lead Model: 6947
Implantable Lead Serial Number: 128499
Implantable Pulse Generator Implant Date: 20191025
Lead Channel Impedance Value: 399 Ohm
Lead Channel Impedance Value: 456 Ohm
Lead Channel Impedance Value: 513 Ohm
Lead Channel Impedance Value: 551 Ohm
Lead Channel Impedance Value: 589 Ohm
Lead Channel Impedance Value: 950 Ohm
Lead Channel Pacing Threshold Amplitude: 0.5 V
Lead Channel Pacing Threshold Amplitude: 0.75 V
Lead Channel Pacing Threshold Amplitude: 1 V
Lead Channel Pacing Threshold Amplitude: 1.5 V
Lead Channel Pacing Threshold Pulse Width: 0.4 ms
Lead Channel Pacing Threshold Pulse Width: 0.4 ms
Lead Channel Pacing Threshold Pulse Width: 0.4 ms
Lead Channel Pacing Threshold Pulse Width: 0.4 ms
Lead Channel Sensing Intrinsic Amplitude: 0 mV
Lead Channel Sensing Intrinsic Amplitude: 1 mV
Lead Channel Sensing Intrinsic Amplitude: 1 mV
Lead Channel Sensing Intrinsic Amplitude: 1.25 mV
Lead Channel Sensing Intrinsic Amplitude: 14.625 mV
Lead Channel Sensing Intrinsic Amplitude: 14.625 mV
Lead Channel Setting Pacing Amplitude: 1.25 V
Lead Channel Setting Pacing Amplitude: 1.5 V
Lead Channel Setting Pacing Amplitude: 3.25 V
Lead Channel Setting Pacing Pulse Width: 0.4 ms
Lead Channel Setting Pacing Pulse Width: 0.4 ms
Lead Channel Setting Sensing Sensitivity: 0.3 mV
Zone Setting Status: 755011
Zone Setting Status: 755011

## 2022-03-20 NOTE — Patient Instructions (Signed)
Medication Instructions:  Continue all current medications.  Labwork: none  Testing/Procedures: none  Follow-Up: 1 year - Dr.  Myles Gip    Any Other Special Instructions Will Be Listed Below (If Applicable).   If you need a refill on your cardiac medications before your next appointment, please call your pharmacy.

## 2022-03-20 NOTE — Progress Notes (Signed)
PCP: [facility] Primary Cardiologist: Dr Domenic Polite Primary EP: Dr Myles Gip  Paul [name]. is a 82 y.o. male who presents today for routine electrophysiology followup.  Since last being seen in our clinic, the patient reports doing reasonably well.   He has been treated with XRT for lung CA.  He has recently had SOB.  + edema. Today, he denies symptoms of palpitations, chest pain,  dizziness, presyncope, syncope, or ICD shocks.  The patient is otherwise without complaint today.   Past Medical History:  Diagnosis Date   BPH (benign prostatic hyperplasia)    CAD (coronary artery disease)    Distal circumflex and OM disease with collaterals 2008   Cardiomyopathy    LVEF 25% up to 65%   COPD (chronic obstructive pulmonary disease) (HCC)    Dyslipidemia    Essential hypertension    Gout    History of alcohol abuse    ICD (implantable cardiac defibrillator) battery depletion    CRT-D device   LBBB (left bundle branch block)    Lung cancer (HCC)    Squamous cell carcinoma RLL lung   Lung nodule    [facility]   Migraines    Mitral regurgitation    Severe before CRT with annular dilatation   Orthostatic syncope    Past Surgical History:  Procedure Laterality Date   BI-VENTRICULAR IMPLANTABLE CARDIOVERTER DEFIBRILLATOR N/A 12/30/2011   Procedure: BI-VENTRICULAR IMPLANTABLE CARDIOVERTER DEFIBRILLATOR  (CRT-D);  Surgeon: Thompson Grayer, MD;  Location: [facility] CATH LAB;  Service: Cardiovascular;  Laterality: N/A;   CARDIAC DEFIBRILLATOR PLACEMENT  12/29/12   BiV ICD implanted, generator change 12/30/11 by Dr Rayann Heman MDT Protecta XT CRT-D   ORIF ANKLE FRACTURE Right 08/31/2014   Procedure: OPEN REDUCTION INTERNAL FIXATION (ORIF) ANKLE FRACTURE;  Surgeon: Marybelle Killings, MD;  Location: [place];  Service: Orthopedics;  Laterality: Right;    ROS- all systems are reviewed and negative except as per HPI above  Current Outpatient Medications  Medication Sig Dispense Refill    acetaminophen (TYLENOL) 500 MG tablet Take 1,000 mg by mouth daily.     albuterol (PROVENTIL HFA;VENTOLIN HFA) 108 (90 Base) MCG/ACT inhaler Inhale 2 puffs into the lungs every 4 (four) hours as needed for wheezing or shortness of breath.     atorvastatin (LIPITOR) 80 MG tablet Take 80 mg by mouth daily.     busPIRone (BUSPAR) 10 MG tablet Take 5 mg by mouth 2 (two) times daily.      carbamide peroxide (DEBROX) 6.5 % otic solution Place 5 drops into both ears daily as needed (for ear vwax removal).     carvedilol (COREG) 25 MG tablet Take 12.5 mg by mouth 2 (two) times daily.     cetirizine (ZYRTEC) 10 MG tablet Take 10 mg by mouth daily.     Cholecalciferol (VITAMIN D3) 2000 units TABS Take 2 tablets by mouth daily.     diclofenac sodium (VOLTAREN) 1 % GEL Apply 2 g topically 4 (four) times daily as needed (painful joints).      divalproex (DEPAKOTE) 500 MG 24 hr tablet Take 1,500 mg by mouth at bedtime.     finasteride (PROSCAR) 5 MG tablet Take 5 mg by mouth daily.     folic acid (FOLVITE) 1 MG tablet Take 1 mg by mouth daily.     furosemide (LASIX) 40 MG tablet Take 1 tablet (40 mg total) by mouth daily. 90 tablet 3   gabapentin (NEURONTIN) 300 MG capsule Take  300-600 mg by mouth at bedtime.      hydroxypropyl methylcellulose (ISOPTO TEARS) 2.5 % ophthalmic solution Place 1 drop into both eyes 4 (four) times daily as needed for dry eyes.     Melatonin 10 MG TABS Take 1 tablet by mouth at bedtime.      Misc Natural Products (OSTEO BI-FLEX JOINT SHIELD PO) Take 2 tablets by mouth daily.     Multiple Vitamin (MULTIVITAMIN) tablet Take 1 tablet by mouth daily.     nitroGLYCERIN (NITROSTAT) 0.4 MG SL tablet Place 0.4 mg under the tongue every 5 (five) minutes as needed.     Omega-3 Fatty Acids (FISH OIL) 1000 MG CAPS Take 1 capsule by mouth daily.     omeprazole (PRILOSEC) 20 MG capsule Take 20 mg by mouth daily.     potassium chloride SA (K-DUR,KLOR-CON) 20 MEQ tablet Take 1 tablet (20 mEq  total) by mouth daily as needed (In the days when you take Lasix).     Tiotropium Bromide-Olodaterol (STIOLTO RESPIMAT) 2.5-2.5 MCG/ACT AERS Inhale 2 puffs into the lungs daily.     Tiotropium Bromide-Olodaterol (STIOLTO RESPIMAT) 2.5-2.5 MCG/ACT AERS Inhale 2 puffs into the lungs daily. 4 g 0   No current facility-administered medications for this visit.    Physical Exam: Vitals:   03/20/22 1518  BP: 115/82  Pulse: (!) 123  Weight: 136 lb (61.7 kg)  Height: 5\' 7"  (1.702 m)   Gen: Appears comfortable, well-nourished CV: RRR, no dependent edema The device site is normal -- no tenderness, edema, drainage, redness, threatened erosion. Pulm: breathing slightly labored   ICD interrogation- reviewed in detail today,  See PACEART report  ekg tracing ordered today is personally reviewed and shows sinus with BiV pacing  Wt Readings from Last 3 Encounters:  03/20/22 136 lb (61.7 kg)  06/17/21 138 lb 9.6 oz (62.9 kg)  04/03/21 137 lb 3.2 oz (62.2 kg)    Assessment and Plan:  1.  Acute on chronic systolic dysfunction Euvolemic today EF has previously normalized with CRT Stable on an appropriate medical regimen Normal ICD function See Claudia Desanctis Art report he is device dependant today He will continue to follow in ICM device clinic   2. HTN Very elevated today Start entresto Increase lasix As above  3. Tobacco Cessation advised  Return in a year for EP care  [name], MD 03/20/2022 3:44 PM

## 2022-03-20 NOTE — Progress Notes (Signed)
EPIC Encounter for ICM Monitoring  Patient Name: Paul [name]. is a 82 y.o. male Date: 03/20/2022 Primary Care Physican: [facility] Primary Cardiologist: Domenic Polite Electrophysiologist: Mealor Bi-V Pacing: 99.4%         10/08/2021 Weight: 132 lbs 11/20/2021 Weight: 130 lbs  03/20/2022 Office Weight: 136 lbs                                                             Transmission reviewed.   Pt seen in office for defib check 2/15   Optivol thoracic impedance suggest normal fluid levels with intermittent days with possible fluid accumulation within last month.   Prescribed:  Furosemide 40 mg take 1 tablet (40 mg total) by mouth daily.   Potassium 20 mEq Take 1 tablet (20 mEq total) by mouth daily as needed (In the days when you take Lasix).   Labs: 03/18/2021 Creatinine 0.93, BUN 9, Potassium 4.5, Sodium 138, GFR 83 A complete set of results can be found in Results Review.   Recommendations:  Recommendations given at 2/15 OV with Dr Myles Gip if needed.    Follow-up plan: ICM clinic phone appointment on 04/21/2022.   91 day device clinic remote transmission 04/18/2022.     EP/Cardiology Office Visits:    08/26/2022 with Dr. Domenic Polite.  03/13/2023 with Dr Myles Gip.    Copy of ICM check sent to Dr Myles Gip.     3 month ICM trend: 03/17/2022.    12-14 Month ICM trend:     Paul Billings, RN 03/20/2022 4:15 PM

## 2022-03-28 ENCOUNTER — Telehealth: Payer: Self-pay | Admitting: *Deleted

## 2022-03-28 NOTE — Telephone Encounter (Signed)
Called patient to inform of CT for 04-14-22- arrival time - 2 pm @ [facility] radiology, no restrictions to test, patient to receive results from [employer] on 04-17-22 @ 11 am via telephone, spoke with patient and he is aware of these appts. and the instructions

## 2022-04-01 ENCOUNTER — Ambulatory Visit: Payer: Medicare Other | Admitting: Internal Medicine

## 2022-04-10 ENCOUNTER — Ambulatory Visit: Payer: Self-pay | Admitting: Urology

## 2022-04-16 NOTE — Progress Notes (Signed)
Radiation Oncology         [phone]) [phone] ________________________________  Name: Paul [name]. 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  The patient was notified in advance and gave permission to proceed with this visit format. He tolerated his treatment well without any ill side effects and remains without complaints.   His follow up CT chest from 12/2021 showed a stable appearance of the previously treated RLL nodule and a stable appearance of the previously noted 1 cm nodule in the right upper lobe lung, based against the pleural surface and partially against the fissure.  There was a new, tiny 7 mm nodule in the LLL of unknown significance and felt possibly due to infection or aspiration so the recommendation was to repeat a scan in 3 months to ensure stability. He had a recent follow up CT Chest on 04/14/22 that shows a stable appearance of the treated RLL lesion with stable treatment related changes and no evidence of disease recurrence. The 6 mm subpleural LUL nodule appears stable as well, likely an area of subpleural rounded atelectasis and the LLL seen on the scan in 12/2021, is no longer visible.                   On review of systems, the patient states that he is doing well in general.  He specifically denies chest pain, productive cough, hemoptysis, fever, chills or night sweats.  He has COPD and has noticed some increased shortness  of breath with exertion over the past year, being followed by Dr. Melvyn Novas with [facility] pulmonology in [place]. He reports a healthy appetite and is maintaining his weight.  He denies abdominal pain, nausea, vomiting, diarrhea or constipation.  Overall, he is pleased with his progress to date.  ALLERGIES:  is allergic to codeine, morphine sulfate, and mupirocin.  Meds: Current Outpatient Medications  Medication Sig Dispense Refill   acetaminophen (TYLENOL) 500 MG tablet Take 1,000 mg by mouth daily.     albuterol (PROVENTIL HFA;VENTOLIN HFA) 108 (90 Base) MCG/ACT inhaler Inhale 2 puffs into the lungs every 4 (four) hours as needed for wheezing or shortness of breath.     atorvastatin (LIPITOR) 80 MG tablet Take 80 mg by mouth daily.     busPIRone (BUSPAR) 10 MG tablet Take 5 mg by mouth 2 (two) times daily.      carbamide peroxide (DEBROX) 6.5 % otic solution Place 5 drops into both ears daily as needed (for ear vwax removal).     carvedilol (COREG) 25 MG tablet Take 12.5 mg by mouth 2 (two) times daily.     cetirizine (ZYRTEC) 10 MG tablet Take 10 mg by mouth daily.     Cholecalciferol (VITAMIN D3) 2000 units TABS Take 2 tablets by mouth daily.     diclofenac sodium (VOLTAREN) 1 % GEL Apply 2 g topically 4 (four) times daily as needed (painful joints).  divalproex (DEPAKOTE) 500 MG 24 hr tablet Take 1,500 mg by mouth at bedtime.     finasteride (PROSCAR) 5 MG tablet Take 5 mg by mouth daily.     folic acid (FOLVITE) 1 MG tablet Take 1 mg by mouth daily.     furosemide (LASIX) 40 MG tablet Take 1 tablet (40 mg total) by mouth daily. 90 tablet 3   gabapentin (NEURONTIN) 300 MG capsule Take 300-600 mg by mouth at bedtime.      hydroxypropyl methylcellulose (ISOPTO TEARS) 2.5 % ophthalmic solution Place 1 drop into both eyes 4 (four) times daily as needed for dry eyes.     Melatonin 10 MG TABS Take 1 tablet by mouth at bedtime.      Misc Natural Products (OSTEO BI-FLEX JOINT SHIELD PO) Take 2  tablets by mouth daily.     Multiple Vitamin (MULTIVITAMIN) tablet Take 1 tablet by mouth daily.     nitroGLYCERIN (NITROSTAT) 0.4 MG SL tablet Place 0.4 mg under the tongue every 5 (five) minutes as needed.     Omega-3 Fatty Acids (FISH OIL) 1000 MG CAPS Take 1 capsule by mouth daily.     omeprazole (PRILOSEC) 20 MG capsule Take 20 mg by mouth daily.     potassium chloride SA (K-DUR,KLOR-CON) 20 MEQ tablet Take 1 tablet (20 mEq total) by mouth daily as needed (In the days when you take Lasix).     Tiotropium Bromide-Olodaterol (STIOLTO RESPIMAT) 2.5-2.5 MCG/ACT AERS Inhale 2 puffs into the lungs daily.     Tiotropium Bromide-Olodaterol (STIOLTO RESPIMAT) 2.5-2.5 MCG/ACT AERS Inhale 2 puffs into the lungs daily. 4 g 0   No current facility-administered medications for this visit.    Physical Findings:  vitals were not taken for this visit.   /10 Unable to assess due to telephone follow-up visit format.  Lab Findings: Lab Results  Component Value Date   WBC 8.2 03/18/2021   HGB 15.3 03/18/2021   HCT 45.3 03/18/2021   MCV 86 03/18/2021   PLT 244 03/18/2021     Radiographic Findings: CUP PACEART INCLINIC DEVICE CHECK  Result Date: 03/20/2022 CRT-D device check in office. Thresholds and sensing consistent with previous device measurements. Lead impedance trends stable over time. No mode switch episodes recorded. No ventricular arrhythmia episodes recorded. Patient bi-ventricularly pacing 99.8% of the time. Device programmed with appropriate safety margins. Heart failure diagnostics reviewed and trends are stable for patient. No changes made this session. Estimated longevity 3 years.  Patient enrolled in remote follow up. Plan to check device remotely in 3 months and see in office in 6-12 months. Patient education completed including shock plan.Myrtie Hawk, BSN, RN   Impression/Plan: 50. 82 y/o male with history of Stage IA, T1N0, NSCLC, squamous cell carcinoma of the right lower lobe  lung. He has recovered well from the effects of his SBRT and remains without complaints.  We reviewed his recent CT chest findings which shows  a stable appearance of the treated RLL lesion with stable treatment related changes and no evidence of disease recurrence. The 6 mm subpleural LUL nodule appears stable as well, likely an area of subpleural rounded atelectasis and the LLL seen on the scan in 12/2021, is no longer visible. Therefore, we will continue to monitor with serial CT chest scans at [facility] every 6 months and I will follow-up with him by telephone thereafter to review results.  He does have to make arrangements for transportation since he does not drive himself so we will  make sure he has ample notification prior to each scan. He knows that he is welcome to call at anytime in the interim with any questions or concerns related to his previous radiation.  He appears to have a good understanding of our recommendations and is comfortable and in agreement with the stated plan. 2. COPD. Patient having some increased shortness of breath with activity and is under the care of Dr. Melvyn Novas, pulmonologist at [facility] in [place]. We will continue to follow expectantly.  I personally spent 20 minutes in this encounter including chart review, reviewing radiological studies, telephone conversation with the patient, entering orders and completing documentation.     [name], PA-C

## 2022-04-17 ENCOUNTER — Ambulatory Visit
Admission: RE | Admit: 2022-04-17 | Discharge: 2022-04-17 | Disposition: A | Discharge status: Home / Self Care | Payer: Medicare Other | Source: Ambulatory Visit | Attending: Urology | Admitting: Urology

## 2022-04-17 ENCOUNTER — Encounter: Payer: Self-pay | Admitting: Urology

## 2022-04-17 DIAGNOSIS — C3431 Malignant neoplasm of lower lobe, right bronchus or lung: Secondary | ICD-10-CM

## 2022-04-17 DIAGNOSIS — Z87891 Personal history of nicotine dependence: Secondary | ICD-10-CM | POA: Diagnosis not present

## 2022-04-17 NOTE — Progress Notes (Signed)
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 Patient to review most recent scan results from 04/14/22. I spoke w/ patient's daughter Ms. Manville Wolaver, verified her identity and began nursing interview. She reports some occasional SOB w/ exertion, but otherwise patient is doing well.   Meaningful use complete.   Patient aware of their 11:00am-04/17/2022 telephone appointment w/ Ashlyn Bruning PA-C. I left my extension [phone] in case patient needs anything. Patient verbalized understanding. This concludes the nursing interview.   Patient contact [phone] or daughter Greyson Sobotta [phone]     Leandra Kern, LPN

## 2022-04-18 ENCOUNTER — Ambulatory Visit: Payer: Medicare Other

## 2022-04-18 DIAGNOSIS — I429 Cardiomyopathy, unspecified: Secondary | ICD-10-CM | POA: Diagnosis not present

## 2022-04-20 LAB — CUP PACEART REMOTE DEVICE CHECK
Battery Remaining Longevity: 33 mo
Battery Voltage: 2.96 V
Brady Statistic AP VP Percent: 2.46 %
Brady Statistic AP VS Percent: 0.02 %
Brady Statistic AS VP Percent: 97.36 %
Brady Statistic AS VS Percent: 0.16 %
Brady Statistic RA Percent Paced: 2.46 %
Brady Statistic RV Percent Paced: 99.4 %
Date Time Interrogation Session: 20240315043824
HighPow Impedance: 51 Ohm
HighPow Impedance: 66 Ohm
Implantable Lead Connection Status: 753985
Implantable Lead Connection Status: 753985
Implantable Lead Connection Status: 753985
Implantable Lead Implant Date: 20081014
Implantable Lead Implant Date: 20081014
Implantable Lead Implant Date: 20081014
Implantable Lead Location: 753858
Implantable Lead Location: 753859
Implantable Lead Location: 753860
Implantable Lead Model: 4542
Implantable Lead Model: 5076
Implantable Lead Model: 6947
Implantable Lead Serial Number: 128499
Implantable Pulse Generator Implant Date: 20191025
Lead Channel Impedance Value: 399 Ohm
Lead Channel Impedance Value: 418 Ohm
Lead Channel Impedance Value: 475 Ohm
Lead Channel Impedance Value: 513 Ohm
Lead Channel Impedance Value: 551 Ohm
Lead Channel Impedance Value: 874 Ohm
Lead Channel Pacing Threshold Amplitude: 0.625 V
Lead Channel Pacing Threshold Amplitude: 0.75 V
Lead Channel Pacing Threshold Amplitude: 1.375 V
Lead Channel Pacing Threshold Pulse Width: 0.4 ms
Lead Channel Pacing Threshold Pulse Width: 0.4 ms
Lead Channel Pacing Threshold Pulse Width: 0.4 ms
Lead Channel Sensing Intrinsic Amplitude: 0.875 mV
Lead Channel Sensing Intrinsic Amplitude: 0.875 mV
Lead Channel Sensing Intrinsic Amplitude: 18.125 mV
Lead Channel Sensing Intrinsic Amplitude: 18.125 mV
Lead Channel Setting Pacing Amplitude: 1.25 V
Lead Channel Setting Pacing Amplitude: 1.5 V
Lead Channel Setting Pacing Amplitude: 3 V
Lead Channel Setting Pacing Pulse Width: 0.4 ms
Lead Channel Setting Pacing Pulse Width: 0.4 ms
Lead Channel Setting Sensing Sensitivity: 0.3 mV
Zone Setting Status: 755011
Zone Setting Status: 755011

## 2022-04-21 ENCOUNTER — Ambulatory Visit: Payer: Medicare Other | Attending: Cardiovascular Disease

## 2022-04-21 DIAGNOSIS — I5022 Chronic systolic (congestive) heart failure: Secondary | ICD-10-CM | POA: Diagnosis not present

## 2022-04-21 DIAGNOSIS — Z95810 Presence of automatic (implantable) cardiac defibrillator: Secondary | ICD-10-CM | POA: Diagnosis not present

## 2022-04-25 NOTE — Progress Notes (Signed)
EPIC Encounter for ICM Monitoring  Patient Name: Paul [name]. is a 82 y.o. male Date: 04/25/2022 Primary Care Physican: [facility] Primary Cardiologist: Paul [name] Electrophysiologist: Paul [name] Bi-V Pacing: 99.7%         10/08/2021 Weight: 132 lbs 11/20/2021 Weight: 130 lbs  03/20/2022 Office Weight: 136 lbs 04/25/2022 Weight: 133 lbs                                                             Spoke with patient and heart failure questions reviewed.  Transmission results reviewed.  Pt asymptomatic for fluid accumulation.  Reports feeling well at this time and voices no complaints.     Optivol thoracic impedance suggest normal fluid levels with possible fluid accumulation from 2/25 - 3/4.    Prescribed:  Furosemide 40 mg take 1 tablet (40 mg total) by mouth daily.   Potassium 20 mEq Take 1 tablet (20 mEq total) by mouth daily as needed (In the days when you take Lasix).   Labs: 03/18/2021 Creatinine 0.93, BUN 9, Potassium 4.5, Sodium 138, GFR 83 A complete set of results can be found in Results Review.   Recommendations:  No changes and encouraged to call if experiencing any fluid symptoms.   Follow-up plan: ICM clinic phone appointment on 05/26/2022.   91 day device clinic remote transmission 07/18/2022.     EP/Cardiology Office Visits:  08/26/2022 with Dr. Domenic [name].  03/13/2023 with Dr Myles Gip.    Copy of ICM check sent to Dr Myles Gip.    3 month ICM trend: 04/21/2022.    12-14 Month ICM trend:     [name], RN 04/25/2022 12:52 PM

## 2022-05-02 DIAGNOSIS — Z885 Allergy status to narcotic agent status: Secondary | ICD-10-CM | POA: Diagnosis not present

## 2022-05-02 DIAGNOSIS — Z87891 Personal history of nicotine dependence: Secondary | ICD-10-CM | POA: Diagnosis not present

## 2022-05-02 DIAGNOSIS — S50862A Insect bite (nonvenomous) of left forearm, initial encounter: Secondary | ICD-10-CM | POA: Diagnosis not present

## 2022-05-02 DIAGNOSIS — W57XXXA Bitten or stung by nonvenomous insect and other nonvenomous arthropods, initial encounter: Secondary | ICD-10-CM | POA: Diagnosis not present

## 2022-05-02 DIAGNOSIS — R21 Rash and other nonspecific skin eruption: Secondary | ICD-10-CM | POA: Diagnosis not present

## 2022-05-19 NOTE — Progress Notes (Deleted)
Paul Essex., male    DOB: July 27, 1940,    MRN: [id number]   Brief patient profile:  26   yowm  quit smoking 11/2019  referred to pulmonary clinic in [facility]  03/18/2021 by Paul [name] / PCP at [facility]  for copd evaluation    Finished RT 2020 in [place]  LUL    History of Present Illness  03/18/2021  Pulmonary/ 1st office eval/ Paul [name] / [place] Office maint on stiolto but hfa poor  Chief Complaint  Patient presents with   Consult    Hx of COPD and lung cancer. Would like to discuss Inogen, patient is not currently on oxygen   Dyspnea:  onset was around 2018 gradually worse until quit smoking then some better  Quit walking to mailbox several years pta 300 ft and has to stop while walking back to house  Cough: none Sleep: bed is flat, on side, one pillow  SABA use: way too much Rec Plan A = Automatic = Always=    stiolto 2 puffs each am  Work on inhaler technique:    Plan B = Backup (to supplement plan A, not to replace it) Only use your albuterol inhaler as a rescue medication Ok to try albuterol 15 min before an activity (on alternating days)  that you know would usually make you short of breath  PFTs next available > not done as of [facility]17/2021  [facility]       06/17/2021  f/u ov/Paul [name] office/Paul [name] re: copd ? / group B  maint on stiolto   Chief Complaint  Patient presents with   Follow-up    Patients breathing has improved since last ov. Has gotten portable O2 and a concentrator. Has not used yet    Dyspnea:  MMRC2 = can't walk a nl pace on a flat grade s sob but does fine slow and flat eg shopping  - taking trash out sometimes  Cough: none  Sleeping: bed is flat / no resp cc  SABA use: using bid  "I'm supposed to be taking in every 4 hours"  (note this is not the rec made at last  02: started by [place] but not using    Lung cancer screening: per [facility] Rec Ask for a copy of your PFTs from the [place] Plan A = Automatic = Always=    Stiolto 2 puffs each am  Plan B =  Backup (to supplement plan A, not to replace it) Only use your albuterol inhaler as a rescue medication  Ok to try albuterol 15 min before an activity (on alternating days)  that you know would usually make you short of breath  Make sure you check your oxygen saturation  AT  your highest level of activity (not after you stop)   to be sure it stays over 90%   Please schedule a follow up visit in 6 months but call sooner if needed    05/20/2022  f/u ov/[place] office/Paul [name] re: *** maint on ***  No chief complaint on file.   Dyspnea:  *** Cough: *** Sleeping: *** SABA use: *** 02: *** Covid status: *** Lung cancer screening: ***   No obvious day to day or daytime variability or assoc excess/ purulent sputum or mucus plugs or hemoptysis or cp or chest tightness, subjective wheeze or overt sinus or hb symptoms.   *** without nocturnal  or early am exacerbation  of respiratory  c/o's or need for noct saba. Also denies any obvious  fluctuation of symptoms with weather or environmental changes or other aggravating or alleviating factors except as outlined above   No unusual exposure hx or h/o childhood pna/ asthma or knowledge of premature birth.  Current Allergies, Complete Past Medical History, Past Surgical History, Family History, and Social History were reviewed in [employer] record.  ROS  The following are not active complaints unless bolded Hoarseness, sore throat, dysphagia, dental problems, itching, sneezing,  nasal congestion or discharge of excess mucus or purulent secretions, ear ache,   fever, chills, sweats, unintended wt loss or wt gain, classically pleuritic or exertional cp,  orthopnea pnd or arm/hand swelling  or leg swelling, presyncope, palpitations, abdominal pain, anorexia, nausea, vomiting, diarrhea  or change in bowel habits or change in bladder habits, change in stools or change in urine, dysuria, hematuria,  rash, arthralgias, visual complaints,  headache, numbness, weakness or ataxia or problems with walking or coordination,  change in mood or  memory.        No outpatient medications have been marked as taking for the 05/20/22 encounter (Appointment) with Nyoka Cowden, MD.                   Past Medical History:  Diagnosis Date   BPH (benign prostatic hyperplasia)    CAD (coronary artery disease)    Distal circumflex and OM disease with collaterals 2008   Cardiomyopathy    LVEF 25% up to 65%   COPD (chronic obstructive pulmonary disease) (HCC)    Dyslipidemia    Essential hypertension    Gout    History of alcohol abuse    ICD (implantable cardiac defibrillator) battery depletion    CRT-D device   LBBB (left bundle branch block)    Lung cancer (HCC)    Squamous cell carcinoma RLL lung   Lung nodule    [facility]   Migraines    Mitral regurgitation    Severe before CRT with annular dilatation   Orthostatic syncope        Objective:    Wts   05/20/2022       ***  06/17/21 138 lb 9.6 oz (62.9 kg)  04/03/21 137 lb 3.2 oz (62.2 kg)  03/18/21 138 lb 0.6 oz (62.6 kg)     Vital signs reviewed  05/20/2022  - Note at rest 02 sats  ***% on ***   General appearance:    ***      Mod bar***           Assessment

## 2022-05-19 NOTE — Progress Notes (Signed)
Remote ICD transmission.   

## 2022-05-20 ENCOUNTER — Ambulatory Visit: Payer: Medicare Other | Admitting: Internal Medicine

## 2022-05-21 NOTE — Progress Notes (Deleted)
Dillard Essex., male    DOB: 06/02/1940,    MRN: [id number]   Brief patient profile:  81  yowm  quit smoking 11/2019  referred to pulmonary clinic in [facility]  03/18/2021 by Leonie Douglas / PCP at [place] [facility]  for copd evaluation    Finished RT 2020 in [place]  LUL    History of Present Illness  03/18/2021  Pulmonary/ 1st office eval/ Cheila Wickstrom / [place] Office maint on stiolto but hfa poor  Chief Complaint  Patient presents with   Consult    Hx of COPD and lung cancer. Would like to discuss Inogen, patient is not currently on oxygen   Dyspnea:  onset was around 2018 gradually worse until quit smoking then some better  Quit walking to mailbox several years pta 300 ft and has to stop while walking back to house  Cough: none Sleep: bed is flat, on side, one pillow  SABA use: way too much Rec Plan A = Automatic = Always=    stiolto 2 puffs each am  Work on inhaler technique:    Plan B = Backup (to supplement plan A, not to replace it) Only use your albuterol inhaler as a rescue medication Ok to try albuterol 15 min before an activity (on alternating days)  that you know would usually make you short of breath  PFTs next available > not done as of 06/17/2021  [facility]"       06/17/2021  f/u ov/[place] office/Merie Wulf re: copd ? / group B  maint on stiolto   Chief Complaint  Patient presents with   Follow-up    Patients breathing has improved since last ov. Has gotten portable O2 and a concentrator. Has not used yet    Dyspnea:  MMRC2 = can't walk a nl pace on a flat grade s sob but does fine slow and flat eg shopping  - taking trash out sometimes  Cough: none  Sleeping: bed is flat / no resp cc  SABA use: using bid  "I'm supposed to be taking in every 4 hours"  (note this is not the rec made at last  02: started by [place] but not using    Lung cancer screening: per [facility] Rec Ask for a copy of your PFTs from the [place] Plan A = Automatic = Always=    Stiolto 2 puffs each am  Plan B =  Backup (to supplement plan A, not to replace it) Only use your albuterol inhaler as a rescue medication  Ok to try albuterol 15 min before an activity (on alternating days)  that you know would usually make you short of breath  Make sure you check your oxygen saturation  AT  your highest level of activity (not after you stop)   to be sure it stays over 90%   Please schedule a follow up visit in 6 months but call sooner if needed    05/22/2022  f/u ov/[place] office/Siria Calandro re: *** maint on ***  No chief complaint on file.   Dyspnea:  *** Cough: *** Sleeping: *** SABA use: *** 02: *** Covid status: *** Lung cancer screening: ***   No obvious day to day or daytime variability or assoc excess/ purulent sputum or mucus plugs or hemoptysis or cp or chest tightness, subjective wheeze or overt sinus or hb symptoms.   *** without nocturnal  or early am exacerbation  of respiratory  c/o's or need for noct saba. Also denies any obvious fluctuation  of symptoms with weather or environmental changes or other aggravating or alleviating factors except as outlined above   No unusual exposure hx or h/o childhood pna/ asthma or knowledge of premature birth.  Current Allergies, Complete Past Medical History, Past Surgical History, Family History, and Social History were reviewed in [employer] record.  ROS  The following are not active complaints unless bolded Hoarseness, sore throat, dysphagia, dental problems, itching, sneezing,  nasal congestion or discharge of excess mucus or purulent secretions, ear ache,   fever, chills, sweats, unintended wt loss or wt gain, classically pleuritic or exertional cp,  orthopnea pnd or arm/hand swelling  or leg swelling, presyncope, palpitations, abdominal pain, anorexia, nausea, vomiting, diarrhea  or change in bowel habits or change in bladder habits, change in stools or change in urine, dysuria, hematuria,  rash, arthralgias, visual complaints,  headache, numbness, weakness or ataxia or problems with walking or coordination,  change in mood or  memory.        No outpatient medications have been marked as taking for the 05/22/22 encounter (Appointment) with Nyoka Cowden, MD.                   Past Medical History:  Diagnosis Date   BPH (benign prostatic hyperplasia)    CAD (coronary artery disease)    Distal circumflex and OM disease with collaterals 2008   Cardiomyopathy    LVEF 25% up to 65%   COPD (chronic obstructive pulmonary disease) (HCC)    Dyslipidemia    Essential hypertension    Gout    History of alcohol abuse    ICD (implantable cardiac defibrillator) battery depletion    CRT-D device   LBBB (left bundle branch block)    Lung cancer (HCC)    Squamous cell carcinoma RLL lung   Lung nodule    [facility]   Migraines    Mitral regurgitation    Severe before CRT with annular dilatation   Orthostatic syncope        Objective:    Wts   05/22/2022       ***  06/17/21 138 lb 9.6 oz (62.9 kg)  04/03/21 137 lb 3.2 oz (62.2 kg)  03/18/21 138 lb 0.6 oz (62.6 kg)     Vital signs reviewed  05/22/2022  - Note at rest 02 sats  ***% on ***   General appearance:    ***      Mod bar***           Assessment

## 2022-05-22 ENCOUNTER — Ambulatory Visit: Payer: Medicare Other | Admitting: Internal Medicine

## 2022-05-26 ENCOUNTER — Ambulatory Visit: Payer: Medicare Other | Attending: Cardiovascular Disease

## 2022-05-26 DIAGNOSIS — I5022 Chronic systolic (congestive) heart failure: Secondary | ICD-10-CM | POA: Diagnosis not present

## 2022-05-26 DIAGNOSIS — Z95810 Presence of automatic (implantable) cardiac defibrillator: Secondary | ICD-10-CM

## 2022-05-27 NOTE — Progress Notes (Signed)
EPIC Encounter for ICM Monitoring  Patient Name: Paul [name]. is a 82 y.o. male Date: 05/27/2022 Primary Care Physican: [facility] Primary Cardiologist: Diona Browner Electrophysiologist: Mealor Bi-V Pacing: 99.0%         10/08/2021 Weight: 132 lbs 11/20/2021 Weight: 130 lbs  03/20/2022 Office Weight: 136 lbs 04/25/2022 Weight: 133 lbs                                                             Spoke with patient and heart failure questions reviewed.  Transmission results reviewed.  Pt asymptomatic for fluid accumulation.  Reports feeling well at this time and voices no complaints.     Optivol thoracic impedance suggesting possible fluid accumulation starting 3/31 and returned to normal 4/22.  Also suggesting possible fluid accumulation from 3/18-3/29.    Prescribed:  Furosemide 40 mg take 1 tablet (40 mg total) by mouth daily.   Potassium 20 mEq Take 1 tablet (20 mEq total) by mouth daily as needed (In the days when you take Lasix).   Labs: 03/18/2021 Creatinine 0.93, BUN 9, Potassium 4.5, Sodium 138, GFR 83 A complete set of results can be found in Results Review.   Recommendations:  No changes and encouraged to call if experiencing any fluid symptoms.   Follow-up plan: ICM clinic phone appointment on 07/01/2022.   91 day device clinic remote transmission 07/18/2022.     EP/Cardiology Office Visits:  08/26/2022 with Dr. Diona Browner.  03/13/2023 with Dr Nelly Laurence.    Copy of ICM check sent to Dr Nelly Laurence.     3 month ICM trend: 05/26/2022.    12-14 Month ICM trend:     Karie Soda, RN 05/27/2022 4:04 PM

## 2022-07-01 ENCOUNTER — Ambulatory Visit: Payer: Medicare Other | Attending: Cardiovascular Disease

## 2022-07-01 DIAGNOSIS — I5022 Chronic systolic (congestive) heart failure: Secondary | ICD-10-CM

## 2022-07-01 DIAGNOSIS — Z95810 Presence of automatic (implantable) cardiac defibrillator: Secondary | ICD-10-CM

## 2022-07-02 NOTE — Progress Notes (Signed)
EPIC Encounter for ICM Monitoring  Patient Name: Paul [name]. is a 82 y.o. male Date: 07/02/2022 Primary Care Physican: [facility] Primary Cardiologist: Diona Browner Electrophysiologist: Mealor Bi-V Pacing: 99.0%         10/08/2021 Weight: 132 lbs 11/20/2021 Weight: 130 lbs  03/20/2022 Office Weight: 136 lbs 04/25/2022 Weight: 133 lbs 07/02/2022 Weight: 132 lbs                                                             Spoke with patient and heart failure questions reviewed.  Transmission results reviewed.  Pt reports 4 lbs weight gain in the last 2 weeks.     Diet:  Eating a lot of restaurant foods.    Optivol thoracic impedance suggesting possible fluid accumulation starting 5/6.    Prescribed:  Furosemide 40 mg take 1 tablet (40 mg total) by mouth daily.   Potassium 20 mEq Take 1 tablet (20 mEq total) by mouth daily as needed (In the days when you take Lasix).   Labs: 03/18/2021 Creatinine 0.93, BUN 9, Potassium 4.5, Sodium 138, GFR 83 A complete set of results can be found in Results Review.   Recommendations:  Patient will take Furosemide 1 tablet and Potassium 1 tablet twice a day of each x 3 days and then return to prescribed dosage.     Follow-up plan: ICM clinic phone appointment on 07/07/2022 to recheck fluid levels.   91 day device clinic remote transmission 07/18/2022.     EP/Cardiology Office Visits:  08/26/2022 with Dr. Diona Browner.  03/13/2023 with Dr Nelly Laurence.    Copy of ICM check sent to Dr Nelly Laurence.  Copy sent to Dr Diona Browner as Lorain Childes and review.     3 month ICM trend: 07/01/2022.    12-14 Month ICM trend:     Karie Soda, RN 07/02/2022 2:51 PM

## 2022-07-02 NOTE — Progress Notes (Signed)
  Received: Today Strader, Lennart Pall, PA-C  Riddik Senna, Josephine Igo, RN Covering for Dr. Diona Browner - Agree with recommendations about taking extra Lasix! Thanks!

## 2022-07-07 ENCOUNTER — Ambulatory Visit: Payer: Medicare Other | Attending: Cardiovascular Disease

## 2022-07-07 DIAGNOSIS — Z95810 Presence of automatic (implantable) cardiac defibrillator: Secondary | ICD-10-CM

## 2022-07-07 DIAGNOSIS — I5022 Chronic systolic (congestive) heart failure: Secondary | ICD-10-CM

## 2022-07-09 ENCOUNTER — Telehealth: Payer: Self-pay

## 2022-07-09 NOTE — Progress Notes (Signed)
EPIC Encounter for ICM Monitoring  Patient Name: Paul [name]. is a 82 y.o. male Date: 07/09/2022 Primary Care Physican: [facility] Primary Cardiologist: Diona Browner Electrophysiologist: Mealor Bi-V Pacing: 98.7%         10/08/2021 Weight: 132 lbs 11/20/2021 Weight: 130 lbs  03/20/2022 Office Weight: 136 lbs 04/25/2022 Weight: 133 lbs 07/02/2022 Weight: 132 lbs                                                             Attempted call to patient and unable to reach.  Left detailed message per DPR regarding transmission. Transmission reviewed.    Diet:  Eating a lot of restaurant foods.     Optivol thoracic impedance suggesting fluid levels returned to normal after recommendation to take Lasix 40 mg bid x 3 days.    Prescribed:  Furosemide 40 mg take 1 tablet (40 mg total) by mouth daily.   Potassium 20 mEq Take 1 tablet (20 mEq total) by mouth daily as needed (In the days when you take Lasix).   Labs: 03/18/2021 Creatinine 0.93, BUN 9, Potassium 4.5, Sodium 138, GFR 83 A complete set of results can be found in Results Review.   Recommendations:  Left voice mail with ICM number and encouraged to call if experiencing any fluid symptoms.   Follow-up plan: ICM clinic phone appointment on 08/04/2022.   91 day device clinic remote transmission 07/18/2022.     EP/Cardiology Office Visits:  08/26/2022 with Dr. Diona Browner.  03/13/2023 with Dr Nelly Laurence.    Copy of ICM check sent to Dr Nelly Laurence.   3 month ICM trend: 07/07/2022.    12-14 Month ICM trend:     Karie Soda, RN 07/09/2022 12:28 PM

## 2022-07-09 NOTE — Telephone Encounter (Signed)
Remote ICM transmission received.  Attempted call to patient regarding ICM remote transmission and left detailed message per DPR.  Left ICM phone number and advised to return call for any fluid symptoms or questions. Next ICM remote transmission scheduled 08/04/2022.    

## 2022-07-18 ENCOUNTER — Ambulatory Visit: Payer: No Typology Code available for payment source

## 2022-07-28 ENCOUNTER — Ambulatory Visit: Payer: Medicare Other | Admitting: Internal Medicine

## 2022-08-08 NOTE — Progress Notes (Signed)
No ICM remote transmission received for 08/04/2022 and next ICM transmission scheduled for 09/01/2022.

## 2022-08-26 ENCOUNTER — Ambulatory Visit: Payer: Medicare Other | Admitting: Cardiology

## 2022-08-26 NOTE — Progress Notes (Deleted)
    Cardiology Office Note  Date: 08/26/2022   ID: Paul Bouldin., DOB [birth year], MRN [id number]  History of Present Illness: Paul Crist. is an 82 y.o. male last seen in March 2023.  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 Nelly Laurence.  Device interrogation in March revealed normal function.  Physical Exam: VS:  There were no vitals taken for this visit., BMI There is no height or weight on file to calculate BMI.  Wt Readings from Last 3 Encounters:  03/20/22 136 lb (61.7 kg)  06/17/21 138 lb 9.6 oz (62.9 kg)  04/03/21 137 lb 3.2 oz (62.2 kg)    General: Patient appears comfortable at rest. HEENT: Conjunctiva and lids normal, oropharynx clear with moist mucosa. Neck: Supple, no elevated JVP or carotid bruits, no thyromegaly. Lungs: Clear to auscultation, nonlabored breathing at rest. Cardiac: Regular rate and rhythm, no S3 or significant systolic murmur, no pericardial rub. Abdomen: Soft, nontender, no hepatomegaly, bowel sounds present, no guarding or rebound. Extremities: No pitting edema, distal pulses 2+. Skin: Warm and dry. Musculoskeletal: No kyphosis. Neuropsychiatric: Alert and oriented x3, affect grossly appropriate.  ECG:  An ECG dated 03/20/2022 was personally reviewed today and demonstrated:  Ventricular pacing with atrial sensing.  Labwork:  February 2023: Hemoglobin 15.3, platelets 244, BUN 9, creatinine 0.93, potassium 4.5  Other Studies Reviewed Today:  Echocardiogram 05/23/2021:  1. Left ventricular ejection fraction, by estimation, is 55%. The left  ventricle has normal function. The left ventricle demonstrates regional  wall motion abnormalities (see scoring diagram/findings for description).  Left ventricular diastolic  parameters are consistent with Grade I diastolic dysfunction (impaired  relaxation).   2. Right ventricular systolic function is normal. The right ventricular  size is normal. There is normal pulmonary artery  systolic pressure. The  estimated right ventricular systolic pressure is 33.9 mmHg.   3. The mitral valve is grossly normal. Mild to moderate mitral valve  regurgitation.   4. The aortic valve is tricuspid. Aortic valve regurgitation is mild.  Aortic valve sclerosis/calcification is present, without any evidence of  aortic stenosis. Aortic regurgitation PHT measures 747 msec. Aortic valve  mean gradient measures 6.0 mmHg.   5. The inferior vena cava is normal in size with greater than 50%  respiratory variability, suggesting right atrial pressure of 3 mmHg.   Assessment and Plan:  1.  CAD managed medically, distal circumflex and OM disease as of 2008.  2.  History of cardiomyopathy with normalization of LVEF.  Echocardiogram from April 2023 revealed LVEF 55%.  3.  Essential hypertension.  4.  Mixed hyperlipidemia.  5.  Mitral regurgitation, mild to moderate by echocardiogram in April 2023.  6.  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 Nelly Laurence.  Disposition:  Follow up {follow up:15908}  Signed, Jonelle Sidle, M.D., F.A.C.C. [facility]

## 2022-09-03 NOTE — Progress Notes (Signed)
No ICM remote transmission received for 09/01/2022 and next ICM transmission scheduled for 09/22/2022.

## 2022-09-25 NOTE — Progress Notes (Signed)
No ICM remote transmission received for 09/22/2022 and next ICM transmission scheduled for 10/13/2022.

## 2022-10-17 ENCOUNTER — Ambulatory Visit: Payer: No Typology Code available for payment source

## 2022-10-20 NOTE — Progress Notes (Signed)
No ICM remote transmission received for 10/13/2022 and next ICM transmission scheduled for 11/10/2022.

## 2022-10-22 ENCOUNTER — Inpatient Hospital Stay: Admission: RE | Admit: 2022-10-22 | Payer: Medicare Other | Source: Ambulatory Visit | Admitting: Urology

## 2022-10-29 ENCOUNTER — Ambulatory Visit: Payer: Medicare Other | Admitting: Urology

## 2022-11-12 ENCOUNTER — Ambulatory Visit
Admission: RE | Admit: 2022-11-12 | Discharge: 2022-11-12 | Disposition: A | Discharge status: Home / Self Care | Payer: Medicare Other | Source: Ambulatory Visit | Attending: Urology | Admitting: Urology

## 2022-11-12 ENCOUNTER — Encounter: Payer: Self-pay | Admitting: Urology

## 2022-11-12 DIAGNOSIS — C3431 Malignant neoplasm of lower lobe, right bronchus or lung: Secondary | ICD-10-CM

## 2022-11-12 NOTE — Progress Notes (Addendum)
Radiation Oncology         [phone]) [id number] ________________________________  Name: Paul [name]. 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  The patient was notified in advance and gave permission to proceed with this visit format. He tolerated his treatment well without any ill side effects and remains without complaints.   His follow up CT chest from 04/14/22 showed a stable appearance of the treated RLL lesion with stable treatment related changes and no evidence of disease recurrence. The 6 mm subpleural LUL nodule appeared stable as well, likely an area of subpleural rounded atelectasis and the LLL nodule seen on the scan in 12/2021, was no longer visible.  He had a recent follow up CT Chest on 11/11/22 that shows continued stability with post-radiation changes in the RLL but no new or progressive findings.  We reviewed these results today.            On review of systems, the patient states that he has continued doing well in general.  He specifically denies chest pain, productive cough, hemoptysis, fever, chills or night sweats.  He has COPD and feels like his shortness of breath with exertion has remained stable, being followed by Dr. Sherene Sires with [facility] pulmonology in [place]. He reports a healthy appetite and is maintaining his weight.  He denies abdominal pain, nausea, vomiting, diarrhea or constipation.  Overall, he is pleased with his progress to  date.  ALLERGIES:  is allergic to codeine, morphine sulfate, and mupirocin.  Meds: Current Outpatient Medications  Medication Sig Dispense Refill   acetaminophen (TYLENOL) 500 MG tablet Take 1,000 mg by mouth daily.     albuterol (PROVENTIL HFA;VENTOLIN HFA) 108 (90 Base) MCG/ACT inhaler Inhale 2 puffs into the lungs every 4 (four) hours as needed for wheezing or shortness of breath.     atorvastatin (LIPITOR) 80 MG tablet Take 80 mg by mouth daily.     busPIRone (BUSPAR) 10 MG tablet Take 5 mg by mouth 2 (two) times daily.      carbamide peroxide (DEBROX) 6.5 % otic solution Place 5 drops into both ears daily as needed (for ear vwax removal).     carvedilol (COREG) 25 MG tablet Take 12.5 mg by mouth 2 (two) times daily.     cetirizine (ZYRTEC) 10 MG tablet Take 10 mg by mouth daily.     Cholecalciferol (VITAMIN D3) 2000 units TABS Take 2 tablets by mouth daily.     diclofenac sodium (VOLTAREN) 1 % GEL Apply 2 g topically 4 (four) times daily as needed (painful joints).      divalproex (DEPAKOTE) 500 MG 24 hr tablet Take 1,500 mg by mouth at bedtime.     finasteride (PROSCAR) 5 MG tablet Take 5 mg by mouth daily.     folic acid (FOLVITE) 1 MG tablet Take 1 mg by mouth daily.     furosemide (LASIX) 40 MG tablet Take 1 tablet (  40 mg total) by mouth daily. 90 tablet 3   gabapentin (NEURONTIN) 300 MG capsule Take 300-600 mg by mouth at bedtime.      hydroxypropyl methylcellulose (ISOPTO TEARS) 2.5 % ophthalmic solution Place 1 drop into both eyes 4 (four) times daily as needed for dry eyes.     Melatonin 10 MG TABS Take 1 tablet by mouth at bedtime.      Misc Natural Products (OSTEO BI-FLEX JOINT SHIELD PO) Take 2 tablets by mouth daily.     Multiple Vitamin (MULTIVITAMIN) tablet Take 1 tablet by mouth daily.     nitroGLYCERIN (NITROSTAT) 0.4 MG SL tablet Place 0.4 mg under the tongue every 5 (five) minutes as needed.     Omega-3 Fatty Acids (FISH OIL) 1000 MG CAPS Take 1 capsule by mouth  daily.     omeprazole (PRILOSEC) 20 MG capsule Take 20 mg by mouth daily.     potassium chloride SA (K-DUR,KLOR-CON) 20 MEQ tablet Take 1 tablet (20 mEq total) by mouth daily as needed (In the days when you take Lasix).     Tiotropium Bromide-Olodaterol (STIOLTO RESPIMAT) 2.5-2.5 MCG/ACT AERS Inhale 2 puffs into the lungs daily.     Tiotropium Bromide-Olodaterol (STIOLTO RESPIMAT) 2.5-2.5 MCG/ACT AERS Inhale 2 puffs into the lungs daily. 4 g 0   No current facility-administered medications for this encounter.    Physical Findings:  vitals were not taken for this visit.  Pain Assessment Pain Score: 0-No pain/10 Unable to assess due to telephone follow-up visit format.  Lab Findings: Lab Results  Component Value Date   WBC 8.2 03/18/2021   HGB 15.3 03/18/2021   HCT 45.3 03/18/2021   MCV 86 03/18/2021   PLT 244 03/18/2021     Radiographic Findings: No results found.  Impression/Plan: 1. 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 He has recovered well from the effects of his SBRT and remains without complaints.  We reviewed his recent CT chest findings which shows  a stable appearance of the treated RLL lesion with stable treatment related changes and no evidence of disease recurrence. The nodules in the LUL and LLL appear stable as well. Therefore, we will continue to monitor with serial CT chest scans at [facility] every 6 months and I will follow-up with him by telephone thereafter to review results.  He does have to make arrangements for transportation since he does not drive himself so we will make sure he has ample notification prior to each scan. He knows that he is welcome to call at anytime in the interim with any questions or concerns related to his previous radiation.  He appears to have a good understanding of our recommendations and is comfortable and in agreement with the stated plan. 2. COPD. Clinically  stable and will continue routine follow up under the care of Dr. Sherene Sires, pulmonologist at [facility] pulmonary in [place]. We will continue to follow expectantly.  I personally spent 30 minutes in this encounter including chart review, reviewing radiological studies, telephone conversation with the patient, entering orders and completing documentation.     Marguarite Arbour, PA-C

## 2022-11-12 NOTE — Progress Notes (Signed)
Telephone nursing appointment for patient to review most recent scan results. I verified patient's identity x2 and began nursing interview.   Patient reports cough, fatigue and occasional SOB w/ exertion. Patient denies any other related issues at this time.   Meaningful use complete.   Patient aware of their 11:30am telephone appointment w/ Ashlyn  PA-C. I left my extension [phone] in case patient needs anything. Patient verbalized understanding. This concludes the nursing interview.   Patient contact [phone]     Ruel Favors, LPN

## 2022-11-13 ENCOUNTER — Telehealth: Payer: Self-pay

## 2022-11-13 NOTE — Telephone Encounter (Signed)
Pt agreed to send missed ICM transmission. 

## 2022-11-21 NOTE — Progress Notes (Signed)
No ICM remote transmission received for 11/10/2022 and next ICM transmission scheduled for 12/08/2022.

## 2022-12-09 NOTE — Progress Notes (Signed)
Unable to reach patient for monthly ICM remote follow up and not receiving monthly remote transmission.  Last remote transmission was received 07/07/2022.   Patient disenrolled due patient is not actively participating in [facility] clinic.  Device clinic 91 day remote monitoring will continue per protocol.

## 2023-01-16 ENCOUNTER — Ambulatory Visit: Payer: Medicare Other

## 2023-03-13 ENCOUNTER — Encounter: Payer: Medicare Other | Admitting: Cardiovascular Disease

## 2023-03-20 ENCOUNTER — Ambulatory Visit: Payer: Medicare Other | Admitting: Cardiovascular Disease

## 2023-04-10 ENCOUNTER — Ambulatory Visit: Payer: Medicare Other | Attending: Cardiovascular Disease | Admitting: Cardiovascular Disease

## 2023-04-10 ENCOUNTER — Encounter: Payer: Self-pay | Admitting: Cardiovascular Disease

## 2023-04-10 VITALS — BP 128/82 | HR 64 | Ht 67.0 in | Wt 134.0 lb

## 2023-04-10 DIAGNOSIS — I447 Left bundle-branch block, unspecified: Secondary | ICD-10-CM | POA: Diagnosis not present

## 2023-04-10 DIAGNOSIS — I5022 Chronic systolic (congestive) heart failure: Secondary | ICD-10-CM | POA: Diagnosis not present

## 2023-04-10 NOTE — Progress Notes (Signed)
   PCP: [facility] Primary Cardiologist: Dr Diona Browner Primary EP: Dr Nelly Laurence  Paul [name]. is a 83 y.o. male who presents today for routine electrophysiology followup.  Since last being seen in our clinic, the patient reports doing reasonably well.   He has been treated with XRT for lung CA.  He has recently had SOB.  + edema.   Today, he denies symptoms of palpitations, chest pain,  dizziness, presyncope, syncope, or ICD shocks.  The patient is otherwise without complaint today.       Physical Exam: Vitals:   04/10/23 1323  BP: 128/82  Pulse: 64  SpO2: 100%  Weight: 134 lb (60.8 kg)  Height: 5\' 7"  (1.702 m)   Gen: Appears comfortable, well-nourished CV: RRR, no dependent edema The device site is normal -- no tenderness, edema, drainage, redness, threatened erosion. Pulm: breathing slightly labored   ICD interrogation- reviewed in detail today,  See PACEART report  EKG Interpretation Date/Time:  Friday April 10 2023 13:27:08 EST Ventricular Rate:  64 PR Interval:  162 QRS Duration:  130 QT Interval:  422 QTC Calculation: 435 R Axis:   262  Text Interpretation: Atrial-sensed ventricular-paced rhythm Biventricular pacemaker detected When compared with ECG of 30-Aug-2014 21:06, atrial ectopy, occasional V-pacing present Confirmed by York Pellant [phone]) on 04/10/2023 2:02:02 PM   Wt Readings from Last 3 Encounters:  04/10/23 134 lb (60.8 kg)  03/20/22 136 lb (61.7 kg)  06/17/21 138 lb 9.6 oz (62.9 kg)    Assessment and Plan:  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

## 2023-04-10 NOTE — Patient Instructions (Signed)

## 2023-04-13 ENCOUNTER — Other Ambulatory Visit: Payer: Self-pay | Admitting: Urology

## 2023-04-13 DIAGNOSIS — C3431 Malignant neoplasm of lower lobe, right bronchus or lung: Secondary | ICD-10-CM

## 2023-04-17 ENCOUNTER — Ambulatory Visit: Payer: No Typology Code available for payment source

## 2023-05-13 DIAGNOSIS — C3431 Malignant neoplasm of lower lobe, right bronchus or lung: Secondary | ICD-10-CM | POA: Diagnosis not present

## 2023-05-13 DIAGNOSIS — J432 Centrilobular emphysema: Secondary | ICD-10-CM | POA: Diagnosis not present

## 2023-05-13 DIAGNOSIS — R918 Other nonspecific abnormal finding of lung field: Secondary | ICD-10-CM | POA: Diagnosis not present

## 2023-05-19 NOTE — Progress Notes (Incomplete)
 Apt rescheduled

## 2023-05-20 ENCOUNTER — Ambulatory Visit
Admission: RE | Admit: 2023-05-20 | Discharge: 2023-05-20 | Disposition: A | Discharge status: Home / Self Care | Payer: Medicare Other | Source: Ambulatory Visit | Attending: Urology | Admitting: Urology

## 2023-05-20 ENCOUNTER — Inpatient Hospital Stay
Admission: RE | Admit: 2023-05-20 | Discharge: 2023-05-20 | Disposition: A | Discharge status: Home / Self Care | Payer: Self-pay | Source: Ambulatory Visit | Attending: Radiation Oncology | Admitting: Radiation Oncology

## 2023-05-20 ENCOUNTER — Other Ambulatory Visit: Payer: Self-pay | Admitting: Radiation Oncology

## 2023-05-20 ENCOUNTER — Telehealth: Payer: Self-pay

## 2023-05-20 DIAGNOSIS — C3431 Malignant neoplasm of lower lobe, right bronchus or lung: Secondary | ICD-10-CM

## 2023-05-20 NOTE — Telephone Encounter (Signed)
 Patient no answer for today's apt. Voicemail left.    Avery Bodo, LPN

## 2023-06-01 ENCOUNTER — Encounter: Payer: Self-pay | Admitting: *Deleted

## 2023-06-01 ENCOUNTER — Encounter: Payer: Self-pay | Admitting: Cardiology

## 2023-06-01 ENCOUNTER — Ambulatory Visit: Attending: Cardiology | Admitting: Cardiology

## 2023-06-01 VITALS — BP 138/78 | HR 88 | Ht 67.0 in | Wt 128.8 lb

## 2023-06-01 DIAGNOSIS — I10 Essential (primary) hypertension: Secondary | ICD-10-CM | POA: Diagnosis not present

## 2023-06-01 DIAGNOSIS — I5022 Chronic systolic (congestive) heart failure: Secondary | ICD-10-CM | POA: Diagnosis not present

## 2023-06-01 DIAGNOSIS — I429 Cardiomyopathy, unspecified: Secondary | ICD-10-CM | POA: Diagnosis not present

## 2023-06-01 DIAGNOSIS — I340 Nonrheumatic mitral (valve) insufficiency: Secondary | ICD-10-CM

## 2023-06-01 DIAGNOSIS — Z95810 Presence of automatic (implantable) cardiac defibrillator: Secondary | ICD-10-CM | POA: Diagnosis not present

## 2023-06-01 DIAGNOSIS — I5032 Chronic diastolic (congestive) heart failure: Secondary | ICD-10-CM | POA: Diagnosis not present

## 2023-06-01 DIAGNOSIS — Z8679 Personal history of other diseases of the circulatory system: Secondary | ICD-10-CM | POA: Diagnosis not present

## 2023-06-01 NOTE — Progress Notes (Signed)
    Cardiology Office Note  Date: 06/01/2023   ID: Terrick Dankers., DOB May 06, 1940, MRN [id number]  History of Present Illness: Dorain Cicotte. is an 83 y.o. male last seen in March 2023.  He has had interval follow-up with Dr. Arlester Ladd, I reviewed his note from March.  He is here overdue for a routine visit.  Reports chronic dyspnea on exertion, NYHA class II-III, no exertional chest pain or obvious fluid retention.  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 Arlester Ladd.  Device interrogation in March revealed normal function.  He reports no device shocks or syncope.  We went over his medications.  He is no longer on Entresto , states that he stopped it due to orthostatic lightheadedness.  He remains on Coreg  25 mg twice daily, Lipitor  80 mg daily, Lasix  40 mg daily, and KCl 20 mEq daily.  [facility] follows at the [facility] for [facility] care, we are requesting his most recent lab work.  Echocardiogram in April 2023 revealed LVEF of 55% with mild diastolic dysfunction, normal RV contraction, mild to moderate mitral regurgitation, and mild aortic regurgitation.  Physical Exam: VS:  BP 138/78   Pulse 88   Ht 5\' 7"  (1.702 m)   Wt 128 lb 12.8 oz (58.4 kg)   SpO2 93%   BMI 20.17 kg/m , BMI Body mass index is 20.17 kg/m.  Wt Readings from Last 3 Encounters:  06/01/23 128 lb 12.8 oz (58.4 kg)  04/10/23 134 lb (60.8 kg)  03/20/22 136 lb (61.7 kg)    General: Patient appears comfortable at rest. HEENT: Conjunctiva and lids normal. Neck: Supple, no elevated JVP or carotid bruits Lungs: Clear to auscultation, nonlabored breathing at rest. Cardiac: Regular rate and rhythm, no S3, 2/6 systolic murmur. Extremities: No pitting edema.  ECG:  An ECG dated 04/10/2023 was personally reviewed today and demonstrated:  Ventricular and AV paced rhythm.  Labwork:  No interval lab work for review today.  Other Studies Reviewed Today:  No interval cardiac testing for review  today.  Assessment and Plan:  1.  HFrecEF, history of nonischemic cardiomyopathy with normalization of LVEF, 55 % by last assessment in 2023.  He reports chronic dyspnea on exertion, NYHA class II-III, also contributed to by COPD.  Plan to update echocardiogram.  Current cardiac regimen includes Coreg  25 mg twice daily, Lasix  40 mg daily, and KCl 20 mEq daily.  He did not tolerate Coreg  previously due to orthostatic hypotension.  Can adjust GDMT as able.   2.  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  He continues to follow with Dr. Arlester Ladd.  Reports no device shocks or syncope.   3.  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  He does not describe any angina.  Continue Lipitor  80 mg daily.  4.  Primary hypertension.  No changes made to regimen today.  5.  Mitral regurgitation, mild to moderate by echocardiogram in 2023.  This will be reassessed by echocardiogram as well.  Disposition:  Follow up  6 months.  Signed, Gerard Knight, M.D., F.A.C.C. [facility] at [facility]

## 2023-06-01 NOTE — Patient Instructions (Addendum)

## 2023-06-09 NOTE — Progress Notes (Incomplete)
 Telephone nursing appointment for review of most recent CT-Chest results. I verified patient's identity x2 and began nursing interview.    Patient states issues as follows... -Pain: *** -Fatigue: *** -Chest: *** -Lungs: *** -Cardiac: *** -Skin: *** -Appetite: ***   Patient denies any other related issues at this time.   Meaningful use complete.   Patient aware of their telephone appointment w/ Ashlyn Bruning PA-C. I left my extension [phone] in case patient needs anything. Patient verbalized understanding. This concludes the nursing interview.   Patient preferred phone # [phone]    Avery Bodo, LPN

## 2023-06-10 ENCOUNTER — Encounter: Payer: Self-pay | Admitting: Urology

## 2023-06-10 ENCOUNTER — Ambulatory Visit
Admission: RE | Admit: 2023-06-10 | Discharge: 2023-06-10 | Disposition: A | Discharge status: Home / Self Care | Source: Ambulatory Visit | Attending: Urology | Admitting: Urology

## 2023-06-10 DIAGNOSIS — C3431 Malignant neoplasm of lower lobe, right bronchus or lung: Secondary | ICD-10-CM

## 2023-06-10 NOTE — Progress Notes (Signed)
 Radiation Oncology         [phone]) [phone] ________________________________  Name: Paul [name]. 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  The patient was notified in advance and gave permission to proceed with this visit format. He tolerated his treatment well without any ill side effects and remains without complaints.   His follow up CT chest from 05/13/23 shows continued stability with evolving post-radiation changes in the RLL but no new or progressive findings to suggest disease recurrence or progression.  We reviewed these results by telephone today.            On review of systems, the patient states that he has continued doing well in general.  He specifically denies chest pain, productive cough, hemoptysis, fever, chills or night sweats.  He has COPD and feels like his shortness of breath with exertion has remained stable, being followed by Dr. Waymond Hailey with [facility] pulmonology in [place]. He reports a healthy appetite and is maintaining his weight.  He denies abdominal pain, nausea, vomiting, diarrhea or constipation.  He has had some progressive difficulty with visual acuity and the [facility] is working to get him set up with a local ophthalmologist for cataract surgery in the near future. Overall, he is pleased with his progress to date.  ALLERGIES:  is allergic to codeine, morphine sulfate, and mupirocin .  Meds: Current Outpatient Medications   Medication Sig Dispense Refill   acetaminophen  (TYLENOL ) 500 MG tablet Take 1,000 mg by mouth daily.     albuterol  (PROVENTIL  HFA;VENTOLIN  HFA) 108 (90 Base) MCG/ACT inhaler Inhale 2 puffs into the lungs every 4 (four) hours as needed for wheezing or shortness of breath.     atorvastatin  (LIPITOR ) 80 MG tablet Take 80 mg by mouth daily.     busPIRone  (BUSPAR ) 10 MG tablet Take 5 mg by mouth 2 (two) times daily.      carbamide peroxide (DEBROX) 6.5 % otic solution Place 5 drops into both ears daily as needed (for ear vwax removal).     carvedilol  (COREG ) 25 MG tablet Take 12.5 mg by mouth 2 (two) times daily.     cetirizine (ZYRTEC) 10 MG tablet Take 10 mg by mouth daily.     Cholecalciferol (VITAMIN D3) 2000 units TABS Take 2 tablets by mouth daily.     diclofenac sodium (VOLTAREN) 1 % GEL Apply 2 g topically 4 (four) times daily as needed (painful joints).      divalproex  (DEPAKOTE ) 500 MG 24 hr tablet Take 1,500 mg by mouth at bedtime.     finasteride  (PROSCAR ) 5 MG tablet Take 5 mg by mouth daily.     folic acid  (FOLVITE ) 1 MG tablet Take 1 mg by mouth daily.     furosemide  (LASIX ) 40 MG tablet Take 1 tablet (40 mg total) by mouth daily. 90 tablet 3   gabapentin  (NEURONTIN ) 300 MG capsule Take 300-600 mg by mouth at bedtime.  hydroxypropyl methylcellulose (ISOPTO TEARS) 2.5 % ophthalmic solution Place 1 drop into both eyes 4 (four) times daily as needed for dry eyes.     Melatonin 10 MG TABS Take 1 tablet by mouth at bedtime.      Misc Natural Products (OSTEO BI-FLEX JOINT SHIELD PO) Take 2 tablets by mouth daily.     Multiple Vitamin (MULTIVITAMIN) tablet Take 1 tablet by mouth daily.     nitroGLYCERIN (NITROSTAT) 0.4 MG SL tablet Place 0.4 mg under the tongue every 5 (five) minutes as needed.     Omega-3 Fatty Acids (FISH OIL) 1000 MG CAPS Take 1 capsule by mouth daily.     omeprazole (PRILOSEC) 20 MG capsule Take 20 mg by mouth daily.     potassium chloride  SA (K-DUR,KLOR-CON ) 20  MEQ tablet Take 1 tablet (20 mEq total) by mouth daily as needed (In the days when you take Lasix ).     Tiotropium Bromide -Olodaterol (STIOLTO RESPIMAT ) 2.5-2.5 MCG/ACT AERS Inhale 2 puffs into the lungs daily.     No current facility-administered medications for this encounter.    Physical Findings:  vitals were not taken for this visit.  Pain Assessment Pain Score: 0-No pain/10 Unable to assess due to telephone follow-up visit format.  Lab Findings: Lab Results  Component Value Date   WBC 8.2 03/18/2021   HGB 15.3 03/18/2021   HCT 45.3 03/18/2021   MCV 86 03/18/2021   PLT 244 03/18/2021     Radiographic Findings: No results found.  Impression/Plan: 1. 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 He has recovered well from the effects of his SBRT and remains without complaints.  We reviewed his recent CT chest findings which shows  a stable appearance of the treated RLL lesion with evolving treatment related changes but no evidence of disease recurrence or progression. The nodules in the LUL and LLL appear stable as well. Therefore, we will continue to monitor with serial CT chest scans at [facility] every 6 months and I will follow-up with him by telephone thereafter to review results.  He does have to make arrangements for transportation since he does not drive himself so we will make sure he has ample notification prior to each scan. He knows that he is welcome to call at anytime in the interim with any questions or concerns related to his previous radiation.  He appears to have a good understanding of our recommendations and is comfortable and in agreement with the stated plan. 2. COPD. Clinically stable and will continue routine follow up under the care of Dr. Waymond Hailey, pulmonologist at [facility] pulmonary in [place]. We will continue to follow expectantly.  I personally spent 30 minutes in this encounter including  chart review, reviewing radiological studies, telephone conversation with the patient, entering orders and completing documentation.     Arta Bihari, PA-C

## 2023-06-22 ENCOUNTER — Encounter: Payer: Self-pay | Admitting: Cardiology

## 2023-06-22 ENCOUNTER — Other Ambulatory Visit

## 2023-07-17 ENCOUNTER — Ambulatory Visit: Payer: No Typology Code available for payment source

## 2023-10-16 ENCOUNTER — Encounter

## 2023-12-23 ENCOUNTER — Ambulatory Visit: Admitting: Urology

## 2023-12-30 ENCOUNTER — Ambulatory Visit: Admitting: Urology

## 2024-01-04 NOTE — Progress Notes (Incomplete)
 Follow up call to discuss chest CT scan results from    Pain/cough/SOB?

## 2024-01-12 ENCOUNTER — Telehealth: Payer: Self-pay | Admitting: *Deleted

## 2024-01-12 NOTE — Telephone Encounter (Signed)
 Called patient to ask about scheduling Ct, lvm for a return call

## 2024-01-13 ENCOUNTER — Ambulatory Visit: Admitting: Urology

## 2024-01-15 ENCOUNTER — Encounter

## 2024-02-04 DEATH — deceased

## 2024-04-15 ENCOUNTER — Encounter

## 2024-07-15 ENCOUNTER — Encounter

## 2024-10-14 ENCOUNTER — Encounter

## 2025-01-13 ENCOUNTER — Encounter
